# Patient Record
Sex: Female | Born: 1960 | Race: White | Hispanic: No | Marital: Married | Smoking: Never smoker
Health system: Southern US, Community
[De-identification: ages and names within clinical notes are randomized; demographics above are authoritative.]

## PROBLEM LIST (undated history)

## (undated) DIAGNOSIS — E039 Hypothyroidism, unspecified: Secondary | ICD-10-CM

## (undated) DIAGNOSIS — I509 Heart failure, unspecified: Secondary | ICD-10-CM

## (undated) DIAGNOSIS — E785 Hyperlipidemia, unspecified: Secondary | ICD-10-CM

## (undated) DIAGNOSIS — I493 Ventricular premature depolarization: Secondary | ICD-10-CM

## (undated) HISTORY — PX: ABDOMINAL HYSTERECTOMY: SHX81

## (undated) HISTORY — DX: Ventricular premature depolarization: I49.3

---

## 1999-08-16 ENCOUNTER — Other Ambulatory Visit: Admission: RE | Admit: 1999-08-16 | Discharge: 1999-08-16 | Payer: Self-pay | Admitting: Otolaryngology

## 2001-09-19 ENCOUNTER — Other Ambulatory Visit: Admission: RE | Admit: 2001-09-19 | Discharge: 2001-09-19 | Payer: Self-pay | Admitting: Family Medicine

## 2003-07-31 ENCOUNTER — Ambulatory Visit (HOSPITAL_BASED_OUTPATIENT_CLINIC_OR_DEPARTMENT_OTHER): Admission: RE | Admit: 2003-07-31 | Discharge: 2003-07-31 | Payer: Self-pay | Admitting: Otolaryngology

## 2003-07-31 ENCOUNTER — Ambulatory Visit (HOSPITAL_COMMUNITY): Admission: RE | Admit: 2003-07-31 | Discharge: 2003-07-31 | Payer: Self-pay | Admitting: Otolaryngology

## 2018-01-10 DIAGNOSIS — Z1231 Encounter for screening mammogram for malignant neoplasm of breast: Secondary | ICD-10-CM | POA: Diagnosis not present

## 2018-01-30 DIAGNOSIS — Z0000 Encounter for general adult medical examination without abnormal findings: Secondary | ICD-10-CM | POA: Diagnosis not present

## 2018-01-30 DIAGNOSIS — Z13220 Encounter for screening for lipoid disorders: Secondary | ICD-10-CM | POA: Diagnosis not present

## 2018-05-03 DIAGNOSIS — R3915 Urgency of urination: Secondary | ICD-10-CM | POA: Diagnosis not present

## 2018-05-10 DIAGNOSIS — K640 First degree hemorrhoids: Secondary | ICD-10-CM | POA: Diagnosis not present

## 2018-05-10 DIAGNOSIS — Z1211 Encounter for screening for malignant neoplasm of colon: Secondary | ICD-10-CM | POA: Diagnosis not present

## 2018-05-20 DIAGNOSIS — R030 Elevated blood-pressure reading, without diagnosis of hypertension: Secondary | ICD-10-CM | POA: Diagnosis not present

## 2018-05-20 DIAGNOSIS — H6980 Other specified disorders of Eustachian tube, unspecified ear: Secondary | ICD-10-CM | POA: Diagnosis not present

## 2018-05-24 DIAGNOSIS — H9202 Otalgia, left ear: Secondary | ICD-10-CM | POA: Diagnosis not present

## 2018-05-24 DIAGNOSIS — H6592 Unspecified nonsuppurative otitis media, left ear: Secondary | ICD-10-CM | POA: Diagnosis not present

## 2018-05-24 DIAGNOSIS — R42 Dizziness and giddiness: Secondary | ICD-10-CM | POA: Diagnosis not present

## 2020-09-04 ENCOUNTER — Encounter: Payer: Self-pay | Admitting: General Practice

## 2020-09-11 ENCOUNTER — Other Ambulatory Visit: Payer: Self-pay

## 2020-09-11 ENCOUNTER — Encounter: Payer: Self-pay | Admitting: Cardiology

## 2020-09-11 ENCOUNTER — Ambulatory Visit (INDEPENDENT_AMBULATORY_CARE_PROVIDER_SITE_OTHER): Payer: 59 | Admitting: Cardiology

## 2020-09-11 ENCOUNTER — Telehealth: Payer: Self-pay | Admitting: Radiology

## 2020-09-11 VITALS — BP 130/88 | HR 87 | Ht 64.0 in | Wt 136.0 lb

## 2020-09-11 DIAGNOSIS — R0602 Shortness of breath: Secondary | ICD-10-CM | POA: Diagnosis not present

## 2020-09-11 DIAGNOSIS — I493 Ventricular premature depolarization: Secondary | ICD-10-CM | POA: Diagnosis not present

## 2020-09-11 DIAGNOSIS — E785 Hyperlipidemia, unspecified: Secondary | ICD-10-CM | POA: Diagnosis not present

## 2020-09-11 DIAGNOSIS — R079 Chest pain, unspecified: Secondary | ICD-10-CM

## 2020-09-11 NOTE — Patient Instructions (Signed)
Medication Instructions:  Your physician recommends that you continue on your current medications as directed. Please refer to the Current Medication list given to you today.  Testing/Procedures: Your physician has requested that you have an echocardiogram. Echocardiography is a painless test that uses sound waves to create images of your heart. It provides your doctor with information about the size and shape of your heart and how well your heart's chambers and valves are working. This procedure takes approximately one hour. There are no restrictions for this procedure. This will be done at our [facility] location:  [address]  Your physician has requested that you have a lexiscan myoview. For further information please visit [email or web address] Please follow instruction sheet, as given. This will take place at [address] [facility], [address]  How to prepare for your Myocardial Perfusion Test:  Do not eat or drink 3 hours prior to your test, except you may have water.  Do not consume products containing caffeine (regular or decaffeinated) 12 hours prior to your test. (ex: coffee, chocolate, sodas, tea).  Do bring a list of your current medications with you.  If not listed below, you may take your medications as normal.  Do wear comfortable clothes (no dresses or overalls) and walking shoes, tennis shoes preferred (No heels or open toe shoes are allowed).  Do NOT wear cologne, perfume, aftershave, or lotions (deodorant is allowed).  The test will take approximately 3 to 4 hours to complete  If these instructions are not followed, your test will have to be rescheduled.   CT coronary calcium score. This test is done at [address]. This is $150 out of pocket.   Coronary CalciumScan A coronary calcium scan is an imaging test used to look for deposits of calcium and other fatty materials (plaques) in the inner lining of the blood vessels of the  heart (coronary arteries). These deposits of calcium and plaques can partly clog and narrow the coronary arteries without producing any symptoms or warning signs. This puts a person at risk for a heart attack. This test can detect these deposits before symptoms develop. Tell a health care provider about:  Any allergies you have.  All medicines you are taking, including vitamins, herbs, eye drops, creams, and over-the-counter medicines.  Any problems you or family members have had with anesthetic medicines.  Any blood disorders you have.  Any surgeries you have had.  Any medical conditions you have.  Whether you are pregnant or may be pregnant. What are the risks? Generally, this is a safe procedure. However, problems may occur, including:  Harm to a pregnant woman and her unborn baby. This test involves the use of radiation. Radiation exposure can be dangerous to a pregnant woman and her unborn baby. If you are pregnant, you generally should not have this procedure done.  Slight increase in the risk of cancer. This is because of the radiation involved in the test. What happens before the procedure? No preparation is needed for this procedure. What happens during the procedure?  You will undress and remove any jewelry around your neck or chest.  You will put on a hospital gown.  Sticky electrodes will be placed on your chest. The electrodes will be connected to an electrocardiogram (ECG) machine to record a tracing of the electrical activity of your heart.  A CT scanner will take pictures of your heart. During this time, you will be asked to lie still and hold  your breath for 2-3 seconds while a picture of your heart is being taken. The procedure may vary among health care providers and hospitals. What happens after the procedure?  You can get dressed.  You can return to your normal activities.  It is up to you to get the results of your test. Ask your health care provider, or  the department that is doing the test, when your results will be ready. Summary  A coronary calcium scan is an imaging test used to look for deposits of calcium and other fatty materials (plaques) in the inner lining of the blood vessels of the heart (coronary arteries).  Generally, this is a safe procedure. Tell your health care provider if you are pregnant or may be pregnant.  No preparation is needed for this procedure.  A CT scanner will take pictures of your heart.  You can return to your normal activities after the scan is done. This information is not intended to replace advice given to you by your health care provider. Make sure you discuss any questions you have with your health care provider. Document Released: 03/17/2008 Document Revised: 08/08/2016 Document Reviewed: 08/08/2016 Elsevier Interactive Patient Education  2017 Elsevier Inc.   Christena Deem- Long Term Monitor Instructions   Your physician has requested you wear your ZIO patch monitor 3 days.   This is a single patch monitor.  [employer] supplies one patch monitor per enrollment.  Additional stickers are not available.   Please do not apply patch if you will be having a Nuclear Stress Test, Echocardiogram, Cardiac CT, MRI, or Chest Xray during the time frame you would be wearing the monitor. The patch cannot be worn during these tests.  You cannot remove and re-apply the ZIO XT patch monitor.   Your ZIO patch monitor will be sent USPS Priority mail from [facility] directly to your home address. The monitor may also be mailed to a PO BOX if home delivery is not available.   It may take 3-5 days to receive your monitor after you have been enrolled.   Once you have received you monitor, please review enclosed instructions.  Your monitor has already been registered assigning a specific monitor serial # to you.   Applying the monitor   Shave hair from upper left chest.   Hold abrader disc by orange tab.  Rub abrader  in 40 strokes over left upper chest as indicated in your monitor instructions.   Clean area with 4 enclosed alcohol pads .  Use all pads to assure are is cleaned thoroughly.  Let dry.   Apply patch as indicated in monitor instructions.  Patch will be place under collarbone on left side of chest with arrow pointing upward.   Rub patch adhesive wings for 2 minutes.Remove white label marked "1".  Remove white label marked "2".  Rub patch adhesive wings for 2 additional minutes.   While looking in a mirror, press and release button in center of patch.  A small green light will flash 3-4 times .  This will be your only indicator the monitor has been turned on.     Do not shower for the first 24 hours.  You may shower after the first 24 hours.   Press button if you feel a symptom. You will hear a small click.  Record Date, Time and Symptom in the Patient Log Book.   When you are ready to remove patch, follow instructions on last 2 pages of Patient Log Book.  Stick patch monitor onto last page of Patient Log Book.   Place Patient Log Book in [place] box.  Use locking tab on box and tape box closed securely.  The [employer] and [employer] has [employer] on it.  Please place in mailbox as soon as possible.  Your physician should have your test results approximately 7 days after the monitor has been mailed back to [facility].   Call [facility] Customer Care at [phone] if you have questions regarding your ZIO XT patch monitor.  Call them immediately if you see an orange light blinking on your monitor.   If your monitor falls off in less than 4 days contact our Monitor department at [phone].  If your monitor becomes loose or falls off after 4 days call [employer] at [phone] for suggestions on securing your monitor.   Follow-Up: At [facility], you and your health needs are our priority.  As part of our continuing mission to provide you with exceptional heart care, we have created  designated Provider Care Teams.  These Care Teams include your primary Cardiologist (physician) and Advanced Practice Providers (APPs -  Physician Assistants and Nurse Practitioners) who all work together to provide you with the care you need, when you need it.  We recommend signing up for the patient portal called "MyChart".  Sign up information is provided on this After Visit Summary.  MyChart is used to connect with patients for Virtual Visits (Telemedicine).  Patients are able to view lab/test results, encounter notes, upcoming appointments, etc.  Non-urgent messages can be sent to your provider as well.   To learn more about what you can do with MyChart, go to [email or web address].    Your next appointment:   3 month(s)  The format for your next appointment:   In Person  Provider:   Epifanio Lesches, MD

## 2020-09-11 NOTE — Progress Notes (Signed)
Cardiology Office Note:    Date:  09/11/2020   ID:  Janet Ramirez, DOB [birth year], MRN [id number]  PCP:  Mitzi Hansen, NP  Cardiologist:  No primary care provider on file.  Electrophysiologist:  None   Referring MD: Mitzi Hansen, NP   Chief Complaint  Patient presents with  . Shortness of Breath         History of Present Illness:    Janet Ramirez is a 59 y.o. female with a hx of hypothyroidism, hyperlipidemia who is referred by Mitzi Hansen, NP for evaluation of EKG abnormalities.  She reports that she has been having shortness of breath and chest pain.  States that she notes shortness of breath with exertion, particular when walking up hills.  In addition has been having chest pain, that often occurs at night.  Describes as left-sided dull aching pain/pressure.  Radiates to back.  5 out of 10 in intensity.  Last for about 30 minutes.  States that antacid used to help when she was having the pain but have not been helping recently.  Also reports occasional lightheadedness, denies any syncope.  Denies any lower extremity edema.  Reports has been having palpitations for years.  Occurs about once per week, last for few seconds and resolves.  No smoking history.  Family history includes father had PCI in 73s.  Labs on 08/24/2020 showed LDL 121, creatinine 0.8, sodium 143, potassium 3.7, normal LFTs, TSH 3.85, free T4 0.76.   Current Medications: Current Meds  Medication Sig  . Ascorbic Acid (VITAMIN C) 1000 MG tablet Take 1,000 mg by mouth daily. 1 Tablet Daily  . cholecalciferol (VITAMIN D3) 25 MCG (1000 UNIT) tablet Take 1,000 Units by mouth daily. 1 Tablet Daily  . zinc gluconate 50 MG tablet Take 50 mg by mouth daily. 1 Tablet Daily     Allergies:   Codeine and Sulfacetamide sodium   Social History   Socioeconomic History  . Marital status: Married    Spouse name: Not on file  . Number of children: Not on file  . Years of education: Not on file  . Highest education level: Not  on file  Occupational History  . Not on file  Tobacco Use  . Smoking status: Never Smoker  . Smokeless tobacco: Never Used  Substance and Sexual Activity  . Alcohol use: Not Currently  . Drug use: Never  . Sexual activity: Yes    Partners: Male  Other Topics Concern  . Not on file  Social History Narrative  . Not on file   Social Determinants of Health   Financial Resource Strain: Not on file  Food Insecurity: Not on file  Transportation Needs: Not on file  Physical Activity: Not on file  Stress: Not on file  Social Connections: Not on file     Family History: Family history includes father had PCI in 48s.  ROS:   Please see the history of present illness.    All other systems reviewed and are negative.  EKGs/Labs/Other Studies Reviewed:    The following studies were reviewed today:   EKG:  EKG is ordered today.  The ekg ordered today demonstrates sinus rhythm, rate 87, PVCs, LVH with repolarization abnormalities  Recent Labs: No results found for requested labs within last 8760 hours.  Recent Lipid Panel No results found for: CHOL, TRIG, HDL, CHOLHDL, VLDL, LDLCALC, LDLDIRECT  Physical Exam:    VS:  BP 130/88   Pulse 87   Ht  (1.626 m)  Wt 136 lb (61.7 kg)   SpO2 97%   BMI 23.34 kg/m     Wt Readings from Last 3 Encounters:  09/11/20 136 lb (61.7 kg)     GEN: Well nourished, well developed in no acute distress HEENT: Normal NECK: No JVD; No carotid bruits LYMPHATICS: No lymphadenopathy CARDIAC: RRR, no murmurs, rubs, gallops RESPIRATORY:  Clear to auscultation without rales, wheezing or rhonchi  ABDOMEN: Soft, non-tender, non-distended MUSCULOSKELETAL:  No edema; No deformity  SKIN: Warm and dry NEUROLOGIC:  Alert and oriented x 3 PSYCHIATRIC:  Normal affect   ASSESSMENT:    1. Chest pain of uncertain etiology   2. Shortness of breath   3. PVC's (premature ventricular contractions)   4. Hyperlipidemia, unspecified hyperlipidemia type     PLAN:    Chest pain/dyspnea on exertion: Chest pain is atypical in description.  However also having dyspnea on exertion that could represent anginal equivalent.  She is not a good candidate for coronary CTA given frequent PVCs on EKG.  Will evaluate for ischemia with Lexiscan Myoview.  Will check echocardiogram to evaluate for structural heart disease.  Frequent PVCs: Noted on EKG.  Will check Zio patch x3 days to quantify PVC burden.  Check echocardiogram as above  Hyperlipidemia: LDL 129 08/24/2020.  Does not meet indication for statin given low risk ASCVD risk score.  Will check calcium score for further stratification.  RTC in 3 months   Medication Adjustments/Labs and Tests Ordered: Current medicines are reviewed at length with the patient today.  Concerns regarding medicines are outlined above.  Orders Placed This Encounter  Procedures  . CT CARDIAC SCORING  . MYOCARDIAL PERFUSION IMAGING  . LONG TERM MONITOR (3-14 DAYS)  . EKG 12-Lead  . ECHOCARDIOGRAM COMPLETE   No orders of the defined types were placed in this encounter.   Patient Instructions  Medication Instructions:  Your physician recommends that you continue on your current medications as directed. Please refer to the Current Medication list given to you today.  Testing/Procedures: Your physician has requested that you have an echocardiogram. Echocardiography is a painless test that uses sound waves to create images of your heart. It provides your doctor with information about the size and shape of your heart and how well your heart's chambers and valves are working. This procedure takes approximately one hour. There are no restrictions for this procedure. This will be done at our [facility] location:  [address]  Your physician has requested that you have a lexiscan myoview. For further information please visit [email or web address] Please follow instruction sheet, as given. This will take  place at [address] [facility], [address]  How to prepare for your Myocardial Perfusion Test:  Do not eat or drink 3 hours prior to your test, except you may have water.  Do not consume products containing caffeine (regular or decaffeinated) 12 hours prior to your test. (ex: coffee, chocolate, sodas, tea).  Do bring a list of your current medications with you.  If not listed below, you may take your medications as normal.  Do wear comfortable clothes (no dresses or overalls) and walking shoes, tennis shoes preferred (No heels or open toe shoes are allowed).  Do NOT wear cologne, perfume, aftershave, or lotions (deodorant is allowed).  The test will take approximately 3 to 4 hours to complete  If these instructions are not followed, your test will have to be rescheduled.   CT coronary calcium score. This test is done at  [address]. This is $150 out of pocket.   Coronary CalciumScan A coronary calcium scan is an imaging test used to look for deposits of calcium and other fatty materials (plaques) in the inner lining of the blood vessels of the heart (coronary arteries). These deposits of calcium and plaques can partly clog and narrow the coronary arteries without producing any symptoms or warning signs. This puts a person at risk for a heart attack. This test can detect these deposits before symptoms develop. Tell a health care provider about:  Any allergies you have.  All medicines you are taking, including vitamins, herbs, eye drops, creams, and over-the-counter medicines.  Any problems you or family members have had with anesthetic medicines.  Any blood disorders you have.  Any surgeries you have had.  Any medical conditions you have.  Whether you are pregnant or may be pregnant. What are the risks? Generally, this is a safe procedure. However, problems may occur, including:  Harm to a pregnant woman and her unborn baby. This test involves the use of  radiation. Radiation exposure can be dangerous to a pregnant woman and her unborn baby. If you are pregnant, you generally should not have this procedure done.  Slight increase in the risk of cancer. This is because of the radiation involved in the test. What happens before the procedure? No preparation is needed for this procedure. What happens during the procedure?  You will undress and remove any jewelry around your neck or chest.  You will put on a hospital gown.  Sticky electrodes will be placed on your chest. The electrodes will be connected to an electrocardiogram (ECG) machine to record a tracing of the electrical activity of your heart.  A CT scanner will take pictures of your heart. During this time, you will be asked to lie still and hold your breath for 2-3 seconds while a picture of your heart is being taken. The procedure may vary among health care providers and hospitals. What happens after the procedure?  You can get dressed.  You can return to your normal activities.  It is up to you to get the results of your test. Ask your health care provider, or the department that is doing the test, when your results will be ready. Summary  A coronary calcium scan is an imaging test used to look for deposits of calcium and other fatty materials (plaques) in the inner lining of the blood vessels of the heart (coronary arteries).  Generally, this is a safe procedure. Tell your health care provider if you are pregnant or may be pregnant.  No preparation is needed for this procedure.  A CT scanner will take pictures of your heart.  You can return to your normal activities after the scan is done. This information is not intended to replace advice given to you by your health care provider. Make sure you discuss any questions you have with your health care provider. Document Released: 03/17/2008 Document Revised: 08/08/2016 Document Reviewed: 08/08/2016 Elsevier Interactive Patient  Education  2017 Elsevier Inc.   Christena Deem- Long Term Monitor Instructions   Your physician has requested you wear your ZIO patch monitor 3 days.   This is a single patch monitor.  [employer] supplies one patch monitor per enrollment.  Additional stickers are not available.   Please do not apply patch if you will be having a Nuclear Stress Test, Echocardiogram, Cardiac CT, MRI, or Chest Xray during the time frame you would  be wearing the monitor. The patch cannot be worn during these tests.  You cannot remove and re-apply the ZIO XT patch monitor.   Your ZIO patch monitor will be sent USPS Priority mail from [facility] directly to your home address. The monitor may also be mailed to a PO BOX if home delivery is not available.   It may take 3-5 days to receive your monitor after you have been enrolled.   Once you have received you monitor, please review enclosed instructions.  Your monitor has already been registered assigning a specific monitor serial # to you.   Applying the monitor   Shave hair from upper left chest.   Hold abrader disc by orange tab.  Rub abrader in 40 strokes over left upper chest as indicated in your monitor instructions.   Clean area with 4 enclosed alcohol pads .  Use all pads to assure are is cleaned thoroughly.  Let dry.   Apply patch as indicated in monitor instructions.  Patch will be place under collarbone on left side of chest with arrow pointing upward.   Rub patch adhesive wings for 2 minutes.Remove white label marked "1".  Remove white label marked "2".  Rub patch adhesive wings for 2 additional minutes.   While looking in a mirror, press and release button in center of patch.  A small green light will flash 3-4 times .  This will be your only indicator the monitor has been turned on.     Do not shower for the first 24 hours.  You may shower after the first 24 hours.   Press button if you feel a symptom. You will hear a small click.  Record Date,  Time and Symptom in the Patient Log Book.   When you are ready to remove patch, follow instructions on last 2 pages of Patient Log Book.  Stick patch monitor onto last page of Patient Log Book.   Place Patient Log Book in [place] box.  Use locking tab on box and tape box closed securely.  The [employer] has [employer] on it.  Please place in mailbox as soon as possible.  Your physician should have your test results approximately 7 days after the monitor has been mailed back to [facility].   Call [facility] Customer Care at [phone] if you have questions regarding your ZIO XT patch monitor.  Call them immediately if you see an orange light blinking on your monitor.   If your monitor falls off in less than 4 days contact our Monitor department at [phone].  If your monitor becomes loose or falls off after 4 days call [employer] at [phone] for suggestions on securing your monitor.   Follow-Up: At [facility], you and your health needs are our priority.  As part of our continuing mission to provide you with exceptional heart care, we have created designated Provider Care Teams.  These Care Teams include your primary Cardiologist (physician) and Advanced Practice Providers (APPs -  Physician Assistants and Nurse Practitioners) who all work together to provide you with the care you need, when you need it.  We recommend signing up for the patient portal called "MyChart".  Sign up information is provided on this After Visit Summary.  MyChart is used to connect with patients for Virtual Visits (Telemedicine).  Patients are able to view lab/test results, encounter notes, upcoming appointments, etc.  Non-urgent messages can be sent to your provider as well.   To learn more about  what you can do with MyChart, go to [email or web address].    Your next appointment:   3 month(s)  The format for your next appointment:   In Person  Provider:   Epifanio Lesches,  MD       Signed, Little Ishikawa, MD  09/11/2020 8:53 PM    [facility]

## 2020-09-11 NOTE — Telephone Encounter (Signed)
Enrolled patient for a 3 day Zio XT monitor to be mailed to patients home  

## 2020-09-14 ENCOUNTER — Inpatient Hospital Stay (INDEPENDENT_AMBULATORY_CARE_PROVIDER_SITE_OTHER): Payer: 59

## 2020-09-14 DIAGNOSIS — I493 Ventricular premature depolarization: Secondary | ICD-10-CM

## 2020-09-26 ENCOUNTER — Inpatient Hospital Stay (HOSPITAL_COMMUNITY)
Admission: EM | Admit: 2020-09-26 | Discharge: 2020-09-29 | DRG: 280 | Disposition: A | Discharge status: Home / Self Care | Payer: 59 | Attending: Cardiology | Admitting: Cardiology

## 2020-09-26 ENCOUNTER — Other Ambulatory Visit: Payer: Self-pay

## 2020-09-26 ENCOUNTER — Other Ambulatory Visit (HOSPITAL_COMMUNITY): Payer: 59

## 2020-09-26 ENCOUNTER — Emergency Department (HOSPITAL_COMMUNITY): Payer: 59

## 2020-09-26 ENCOUNTER — Inpatient Hospital Stay (HOSPITAL_COMMUNITY): Payer: 59

## 2020-09-26 ENCOUNTER — Encounter (HOSPITAL_COMMUNITY): Payer: Self-pay | Admitting: Emergency Medicine

## 2020-09-26 DIAGNOSIS — E039 Hypothyroidism, unspecified: Secondary | ICD-10-CM | POA: Diagnosis present

## 2020-09-26 DIAGNOSIS — I214 Non-ST elevation (NSTEMI) myocardial infarction: Principal | ICD-10-CM

## 2020-09-26 DIAGNOSIS — I340 Nonrheumatic mitral (valve) insufficiency: Secondary | ICD-10-CM | POA: Diagnosis not present

## 2020-09-26 DIAGNOSIS — I519 Heart disease, unspecified: Secondary | ICD-10-CM | POA: Diagnosis not present

## 2020-09-26 DIAGNOSIS — I5021 Acute systolic (congestive) heart failure: Secondary | ICD-10-CM | POA: Diagnosis present

## 2020-09-26 DIAGNOSIS — Z882 Allergy status to sulfonamides status: Secondary | ICD-10-CM

## 2020-09-26 DIAGNOSIS — R778 Other specified abnormalities of plasma proteins: Secondary | ICD-10-CM

## 2020-09-26 DIAGNOSIS — Z90710 Acquired absence of both cervix and uterus: Secondary | ICD-10-CM

## 2020-09-26 DIAGNOSIS — I361 Nonrheumatic tricuspid (valve) insufficiency: Secondary | ICD-10-CM | POA: Diagnosis not present

## 2020-09-26 DIAGNOSIS — Z8249 Family history of ischemic heart disease and other diseases of the circulatory system: Secondary | ICD-10-CM | POA: Diagnosis not present

## 2020-09-26 DIAGNOSIS — I428 Other cardiomyopathies: Secondary | ICD-10-CM | POA: Diagnosis present

## 2020-09-26 DIAGNOSIS — R079 Chest pain, unspecified: Secondary | ICD-10-CM | POA: Diagnosis present

## 2020-09-26 DIAGNOSIS — Z20822 Contact with and (suspected) exposure to covid-19: Secondary | ICD-10-CM | POA: Diagnosis present

## 2020-09-26 DIAGNOSIS — I493 Ventricular premature depolarization: Secondary | ICD-10-CM

## 2020-09-26 DIAGNOSIS — R002 Palpitations: Secondary | ICD-10-CM | POA: Diagnosis present

## 2020-09-26 DIAGNOSIS — Z79899 Other long term (current) drug therapy: Secondary | ICD-10-CM

## 2020-09-26 DIAGNOSIS — R0789 Other chest pain: Secondary | ICD-10-CM | POA: Diagnosis present

## 2020-09-26 DIAGNOSIS — I351 Nonrheumatic aortic (valve) insufficiency: Secondary | ICD-10-CM

## 2020-09-26 DIAGNOSIS — E785 Hyperlipidemia, unspecified: Secondary | ICD-10-CM | POA: Diagnosis present

## 2020-09-26 DIAGNOSIS — Z885 Allergy status to narcotic agent status: Secondary | ICD-10-CM | POA: Diagnosis not present

## 2020-09-26 HISTORY — DX: Hypothyroidism, unspecified: E03.9

## 2020-09-26 HISTORY — DX: Hyperlipidemia, unspecified: E78.5

## 2020-09-26 LAB — CBC
HCT: 39.7 % (ref 36.0–46.0)
Hemoglobin: 13.3 g/dL (ref 12.0–15.0)
MCH: 32.1 pg (ref 26.0–34.0)
MCHC: 33.5 g/dL (ref 30.0–36.0)
MCV: 95.9 fL (ref 80.0–100.0)
Platelets: 211 10*3/uL (ref 150–400)
RBC: 4.14 MIL/uL (ref 3.87–5.11)
RDW: 12.1 % (ref 11.5–15.5)
WBC: 4.6 10*3/uL (ref 4.0–10.5)
nRBC: 0 % (ref 0.0–0.2)

## 2020-09-26 LAB — COMPREHENSIVE METABOLIC PANEL
ALT: 19 U/L (ref 0–44)
AST: 26 U/L (ref 15–41)
Albumin: 4.5 g/dL (ref 3.5–5.0)
Alkaline Phosphatase: 68 U/L (ref 38–126)
Anion gap: 12 (ref 5–15)
BUN: 13 mg/dL (ref 6–20)
CO2: 27 mmol/L (ref 22–32)
Calcium: 9.7 mg/dL (ref 8.9–10.3)
Chloride: 103 mmol/L (ref 98–111)
Creatinine, Ser: 0.97 mg/dL (ref 0.44–1.00)
GFR, Estimated: >60 mL/min (ref >60)
Glucose, Bld: 115 mg/dL — ABNORMAL HIGH (ref 70–99)
Potassium: 3.5 mmol/L (ref 3.5–5.1)
Sodium: 142 mmol/L (ref 135–145)
Total Bilirubin: 0.4 mg/dL (ref 0.3–1.2)
Total Protein: 6.9 g/dL (ref 6.5–8.1)

## 2020-09-26 LAB — HIV ANTIBODY (ROUTINE TESTING W REFLEX): HIV Screen 4th Generation wRfx: NONREACTIVE

## 2020-09-26 LAB — BRAIN NATRIURETIC PEPTIDE: B Natriuretic Peptide: 608.1 pg/mL — ABNORMAL HIGH (ref 0.0–100.0)

## 2020-09-26 LAB — RESP PANEL BY RT-PCR (FLU A&B, COVID) ARPGX2
Influenza A by PCR: NEGATIVE
Influenza B by PCR: NEGATIVE
SARS Coronavirus 2 by RT PCR: NEGATIVE

## 2020-09-26 LAB — TSH: TSH: 2.261 u[IU]/mL (ref 0.350–4.500)

## 2020-09-26 LAB — ECHOCARDIOGRAM COMPLETE
Area-P 1/2: 4.6 cm2
Height: 64 in
S' Lateral: 5.4 cm
Single Plane A4C EF: 23.6 %
Weight: 2067.2 oz

## 2020-09-26 LAB — D-DIMER, QUANTITATIVE: D-Dimer, Quant: <0.27 ug/mL-FEU (ref 0.00–0.50)

## 2020-09-26 LAB — TROPONIN I (HIGH SENSITIVITY)
Troponin I (High Sensitivity): 57 ng/L — ABNORMAL HIGH (ref <18)
Troponin I (High Sensitivity): 180 ng/L (ref <18)
Troponin I (High Sensitivity): 187 ng/L (ref <18)
Troponin I (High Sensitivity): 158 ng/L (ref <18)

## 2020-09-26 LAB — HEPARIN LEVEL (UNFRACTIONATED): Heparin Unfractionated: 0.32 IU/mL (ref 0.30–0.70)

## 2020-09-26 LAB — MAGNESIUM: Magnesium: 2.2 mg/dL (ref 1.7–2.4)

## 2020-09-26 MED ORDER — HEPARIN (PORCINE) 25000 UT/250ML-% IV SOLN
850.0000 [IU]/h | INTRAVENOUS | Status: DC
  Administered 2020-09-26: 700 [IU]/h via INTRAVENOUS
  Administered 2020-09-27: 750 [IU]/h via INTRAVENOUS
  Filled 2020-09-26 (×2): qty 250

## 2020-09-26 MED ORDER — ONDANSETRON HCL 4 MG/2ML IJ SOLN: 4.0000 mg | Freq: Four times a day (QID) | INTRAMUSCULAR | Status: DC | PRN

## 2020-09-26 MED ORDER — METOPROLOL TARTRATE 25 MG PO TABS
25.0000 mg | ORAL_TABLET | Freq: Once | ORAL | Status: AC
  Administered 2020-09-26: 25 mg via ORAL
  Filled 2020-09-26: qty 1

## 2020-09-26 MED ORDER — HEPARIN BOLUS VIA INFUSION
3000.0000 [IU] | Freq: Once | INTRAVENOUS | Status: AC
  Administered 2020-09-26: 3000 [IU] via INTRAVENOUS
  Filled 2020-09-26: qty 3000

## 2020-09-26 MED ORDER — NITROGLYCERIN 0.4 MG SL SUBL
0.4000 mg | SUBLINGUAL_TABLET | SUBLINGUAL | Status: DC | PRN
  Filled 2020-09-26: qty 1

## 2020-09-26 MED ORDER — ACETAMINOPHEN 325 MG PO TABS
650.0000 mg | ORAL_TABLET | ORAL | Status: DC | PRN
  Administered 2020-09-26 – 2020-09-28 (×2): 650 mg via ORAL
  Filled 2020-09-26 (×2): qty 2

## 2020-09-26 MED ORDER — SODIUM CHLORIDE 0.9% FLUSH
3.0000 mL | Freq: Two times a day (BID) | INTRAVENOUS | Status: DC
  Administered 2020-09-26 – 2020-09-28 (×4): 3 mL via INTRAVENOUS

## 2020-09-26 MED ORDER — ASPIRIN 81 MG PO CHEW
324.0000 mg | CHEWABLE_TABLET | Freq: Once | ORAL | Status: AC
  Administered 2020-09-26: 324 mg via ORAL
  Filled 2020-09-26: qty 4

## 2020-09-26 MED ORDER — ACETAMINOPHEN 325 MG PO TABS
650.0000 mg | ORAL_TABLET | Freq: Once | ORAL | Status: AC
  Administered 2020-09-26: 650 mg via ORAL
  Filled 2020-09-26: qty 2

## 2020-09-26 MED ORDER — METOPROLOL TARTRATE 25 MG PO TABS
25.0000 mg | ORAL_TABLET | Freq: Two times a day (BID) | ORAL | Status: DC
  Administered 2020-09-26: 25 mg via ORAL
  Filled 2020-09-26: qty 1

## 2020-09-26 MED ORDER — NITROGLYCERIN 2 % TD OINT
0.5000 [in_us] | TOPICAL_OINTMENT | Freq: Once | TRANSDERMAL | Status: AC
  Administered 2020-09-26: 0.5 [in_us] via TOPICAL

## 2020-09-26 MED ORDER — ASPIRIN EC 81 MG PO TBEC
81.0000 mg | DELAYED_RELEASE_TABLET | Freq: Every day | ORAL | Status: DC
  Administered 2020-09-27 – 2020-09-29 (×2): 81 mg via ORAL
  Filled 2020-09-26 (×2): qty 1

## 2020-09-26 MED ORDER — ATORVASTATIN CALCIUM 80 MG PO TABS
80.0000 mg | ORAL_TABLET | Freq: Every day | ORAL | Status: DC
  Administered 2020-09-27 – 2020-09-28 (×2): 80 mg via ORAL
  Filled 2020-09-26 (×2): qty 1

## 2020-09-26 NOTE — ED Notes (Signed)
Pt ambulated down the hall to the restroom without difficulty.

## 2020-09-26 NOTE — ED Provider Notes (Signed)
Patient signed out to me at 7 AM awaiting repeat troponin and likely cardiology consultation.  Patient with left-sided chest pain that she describes as pressure and burning lasted a few minutes.  Has been coming and going.  May be down her left arm.  She states that burping makes it feel better.  Nothing makes it feel worse.  She has been evaluated recently by cardiology for palpitations and overall has felt that her heart was beating fast today.  D-dimer is normal and doubt PE.  She is scheduled for an outpatient stress test and echocardiogram and Holter monitor as well.  Patient with no history of hypertension or diabetes.  Does have high cholesterol but is not on medications.  She denies smoking history.  No significant medical history for heart disease.  Overall not many cardiac risk factors but troponin is 57.  EKG overall without ischemic changes.  Lab work is otherwise unremarkable.  No significant anemia, electrolyte abnormality, kidney injury.  Will repeat troponin and touch base with cardiology.  Currently on my evaluation she is chest pain-free.  Repeat troponin is 180.  Covid test is negative.  Patient started on IV heparin bolus and infusion for non-ST elevation MI.  Patient given aspirin as well.  Talked with Dr. Bufford Buttner with cardiology who will admit the patient over to [facility].  Hemodynamically stable at this time.  Pain well controlled at this time.  This chart was dictated using voice recognition software.  Despite best efforts to proofread,  errors can occur which can change the documentation meaning.    .Critical Care Performed by: Virgina Norfolk, DO Authorized by: Virgina Norfolk, DO   Critical care provider statement:    Critical care time (minutes):  45   Critical care was necessary to treat or prevent imminent or life-threatening deterioration of the following conditions:  Cardiac failure   Critical care was time spent personally by me on the following activities:  Discussions  with consultants, evaluation of patient's response to treatment, examination of patient, ordering and performing treatments and interventions, ordering and review of laboratory studies, ordering and review of radiographic studies, pulse oximetry, re-evaluation of patient's condition, obtaining history from patient or surrogate and review of old charts      Virgina Norfolk, DO 09/26/20 1003

## 2020-09-26 NOTE — H&P (Signed)
Cardiology Admission History and Physical:   Patient ID: Janet Ramirez MRN: [id number]; DOB: 1961/01/21   Admission date: 09/26/2020  Primary Care Provider: Mitzi Hansen, NP [facility] Cardiologist: Little Ishikawa, MD  [facility] Electrophysiologist:  None   Chief Complaint:  Chest pain, tachypalpitation  Patient Profile:   Janet Ramirez is a 59 y.o. female with HLD and hypothyroidism presented with chest pain and tachypalpitation and found to have elevated troponin  History of Present Illness:   Janet Ramirez is a pleasant 59 year old female with past medical history of hyperlipidemia and hypothyroidism who presented with chest pain. Patient mentions she was on levothyroxine for short period in the past however is no longer taking levothyroxine. She does not have any prior history. She was recently seen by Dr. Bjorn Pippin on 09/11/2020 for abnormal EKG. On her EKG, she has very frequent PVCs. Talking with the patient, she endorsed exertional dyspnea and occasional chest discomfort that occurs at rest however not with exertion. Dr. Bjorn Pippin ordered a coronary scoring test and also a Myoview. She was deemed a poor candidate for coronary CT due to frequent PVCs. A 3-day Zio monitor was also ordered to quantify PVC burden.  In the past week, she has been having more frequent episode of chest discomfort. The location of pain is located in the left shoulder blade radiating to the front of the chest. She described it as a pressure-like sensation. Interestingly, she has been able to walk her dog frequently without any exertional type of chest discomfort. She woke up around 3 AM this morning with the same chest discomfort, shortly after, she started having a pounding sensation in her chest and feeling of tachycardia palpitation. Symptom lasted a total 15 to 20 minutes. This prompted her to seek medical attention at [facility]. Initial EKG showed sinus rhythm, nonspecific T wave changes,  frequent PVCs. Serial troponin 57-->180-->187. D-dimer negative. Viral panel negative.    Past Medical History:  Diagnosis Date  . Hyperlipidemia   . Hypothyroidism     Past Surgical History:  Procedure Laterality Date  . ABDOMINAL HYSTERECTOMY  1980s     Medications Prior to Admission: Prior to Admission medications   Medication Sig Start Date End Date Taking? Authorizing Provider  alum & mag hydroxide-simeth (MAALOX/MYLANTA) 200-200-20 MG/5ML suspension Take 30 mLs by mouth every 6 (six) hours as needed for indigestion or heartburn.   Yes [provider]  Ascorbic Acid (VITAMIN C) 1000 MG tablet Take 1,000 mg by mouth daily. 1 Tablet Daily   Yes [provider]  aspirin 325 MG tablet Take 325 mg by mouth as needed for mild pain.   Yes [provider]  bismuth subsalicylate (PEPTO BISMOL) 262 MG chewable tablet Chew 524 mg by mouth as needed for indigestion.   Yes [provider]  calcium carbonate (TUMS - DOSED IN MG ELEMENTAL CALCIUM) 500 MG chewable tablet Chew 1-2 tablets by mouth as needed for indigestion or heartburn.   Yes [provider]  cholecalciferol (VITAMIN D3) 25 MCG (1000 UNIT) tablet Take 1,000 Units by mouth daily. 1 Tablet Daily   Yes [provider]  esomeprazole (NEXIUM) 40 MG capsule Take 40 mg by mouth daily as needed (heartburn).   Yes [provider]  simethicone (MYLICON) 125 MG chewable tablet Chew 125 mg by mouth every 6 (six) hours as needed for flatulence.   Yes [provider]  zinc gluconate 50 MG tablet Take 50 mg by mouth daily. 1 Tablet Daily  Yes [provider]     Allergies:    Allergies  Allergen Reactions  . Codeine Itching  . Sulfacetamide Sodium Diarrhea    Social History:   Social History   Socioeconomic History  . Marital status: Married    Spouse name: Not on file  . Number of children: Not on file  . Years of education: Not on file  . Highest  education level: Not on file  Occupational History  . Not on file  Tobacco Use  . Smoking status: Never Smoker  . Smokeless tobacco: Never Used  Substance and Sexual Activity  . Alcohol use: Yes    Comment: socially  . Drug use: Never  . Sexual activity: Yes    Partners: Male  Other Topics Concern  . Not on file  Social History Narrative  . Not on file   Social Determinants of Health   Financial Resource Strain: Not on file  Food Insecurity: Not on file  Transportation Needs: Not on file  Physical Activity: Not on file  Stress: Not on file  Social Connections: Not on file  Intimate Partner Violence: Not on file    Family History:   The patient's family history includes Diabetes Mellitus II in her father; Heart disease in her father; Hypertension in her mother.    ROS:  Please see the history of present illness.  All other ROS reviewed and negative.     Physical Exam/Data:   Vitals:   09/26/20 0900 09/26/20 1100 09/26/20 1135 09/26/20 1215  BP: 120/68 125/65  116/77  Pulse: (!) 58 61  78  Resp: 15 (!) 29  16  Temp:   97.8 F (36.6 C) 98.6 F (37 C)  TempSrc:   Oral Oral  SpO2: 98% 99%  96%  Weight:    58.6 kg  Height:    5\' 4"  (1.626 m)   No intake or output data in the 24 hours ending 09/26/20 1313 Last 3 Weights 09/26/2020 09/26/2020 09/11/2020  Weight (lbs) 129 lb 3.2 oz 131 lb 12.8 oz 136 lb  Weight (kg) 58.605 kg 59.784 kg 61.689 kg     Body mass index is 22.18 kg/m.  General:  Well nourished, well developed, in no acute distress HEENT: normal Lymph: no adenopathy Neck: no JVD Endocrine:  No thryomegaly Vascular: No carotid bruits; FA pulses 2+ bilaterally without bruits  Cardiac:  normal S1, S2; RRR; no murmur  Lungs:  clear to auscultation bilaterally, no wheezing, rhonchi or rales  Abd: soft, nontender, no hepatomegaly  Ext: no edema Musculoskeletal:  No deformities, BUE and BLE strength normal and equal Skin: warm and dry  Neuro:  CNs 2-12  intact, no focal abnormalities noted Psych:  Normal affect    EKG:  The ECG that was done and was personally reviewed and demonstrates sinus rhythm with poor R wave progression in the anterior leads, minimal ST depression in the inferior leads and V6  Relevant CV Studies:  N/A  Laboratory Data:  High Sensitivity Troponin:   Recent Labs  Lab 09/26/20 0550 09/26/20 0832 09/26/20 1011  TROPONINIHS 57* 180* 187*      Chemistry Recent Labs  Lab 09/26/20 0545  NA 142  K 3.5  CL 103  CO2 27  GLUCOSE 115*  BUN 13  CREATININE 0.97  CALCIUM 9.7  GFRNONAA >60  ANIONGAP 12    Recent Labs  Lab 09/26/20 0545  PROT 6.9  ALBUMIN 4.5  AST 26  ALT 19  ALKPHOS 68  BILITOT 0.4   Hematology Recent Labs  Lab 09/26/20 0550  WBC 4.6  RBC 4.14  HGB 13.3  HCT 39.7  MCV 95.9  MCH 32.1  MCHC 33.5  RDW 12.1  PLT 211   BNPNo results for input(s): BNP, PROBNP in the last 168 hours.  DDimer  Recent Labs  Lab 09/26/20 0545  DDIMER <0.27     Radiology/Studies:  DG Chest 2 View  Result Date: 09/26/2020 CLINICAL DATA:  Chest pain EXAM: CHEST - 2 VIEW COMPARISON:  None. FINDINGS: Borderline cardiomegaly. Negative aortic and hilar contours. There is no edema, consolidation, effusion, or pneumothorax. IMPRESSION: No evidence of active disease. Electronically Signed   By: Marnee Spring M.D.   On: 09/26/2020 05:55     Assessment and Plan:   1. Chest and back pain with elevated troponin  -Chest and back pain occurs intermittently, blood pressure is normal, suspicion for dissection fairly low.  D-dimer negative  -Agree with Dr. Francis Gaines initial assessment that the symptom is quite atypical.  Her recent symptom has been occurring at rest however it does not occur when she walks her dog.  Symptoms alleviated by burping.    - I am unable to explain her mildly elevated troponin.  She is not a candidate for coronary CTA due to frequent PVCs.  Will discuss with MD regarding  Myoview versus definitive evaluation via cardiac catheterization.  -Obtain echocardiogram, if LV function is low, would lean more toward cardiac catheterization.  2. Tachypalpitation: Symptoms this morning started with chest discomfort, however quickly had pounding sensation and tachycardia palpitation that lasted 15 to 20 minutes.  This prompted the patient to seek medical attention.  On telemetry, she has frequent PVCs.  We will continue on the telemetry, however if no obvious arrhythmia, may need to consider a 2-week Zio monitor instead of the previously considered 3 day Zio monitor.  3. Frequent PVCs: obtain TSH.  Patient has very frequent PVCs, recently ordered her monitor to check for PVC burden.  4. Hyperlipidemia: Not on any statins at home.  Obtain fasting lipid panel.  5. Remote h/o hypothyroidism: per patient,       TIMI Risk Score for Unstable Angina or Non-ST Elevation MI:   The patient's TIMI risk score is 2, which indicates a 8% risk of all cause mortality, new or recurrent myocardial infarction or need for urgent revascularization in the next 14 days.      Severity of Illness: The appropriate patient status for this patient is INPATIENT. Inpatient status is judged to be reasonable and necessary in order to provide the required intensity of service to ensure the patient's safety. The patient's presenting symptoms, physical exam findings, and initial radiographic and laboratory data in the context of their chronic comorbidities is felt to place them at high risk for further clinical deterioration. Furthermore, it is not anticipated that the patient will be medically stable for discharge from the hospital within 2 midnights of admission. The following factors support the patient status of inpatient.   " The patient's presenting symptoms include chest pain. " The worrisome physical exam findings include benign. " The initial radiographic and laboratory data are worrisome because of  frequent PVCs. " The chronic co-morbidities include hyperlipidemia.   * I certify that at the point of admission it is my clinical judgment that the patient will require inpatient hospital care spanning beyond 2 midnights from the point of admission due to high intensity of service, high risk for further deterioration and high  frequency of surveillance required.*    For questions or updates, please contact [facility] Please consult [email or web address] for contact info under     Ramond Dial, [place]  09/26/2020 1:13 PM

## 2020-09-26 NOTE — Progress Notes (Signed)
ANTICOAGULATION CONSULT NOTE - Follow Up Consult  Pharmacy Consult for heparin Indication: chest pain/ACS  Allergies  Allergen Reactions  . Codeine Itching  . Sulfacetamide Sodium Diarrhea    Patient Measurements: Height: 5\' 4"  (162.6 cm) Weight: 58.6 kg (129 lb 3.2 oz) IBW/kg (Calculated) : 54.7 Heparin Dosing Weight: 59.8 kg  Vital Signs: Temp: 97.9 F (36.6 C) (12/25 1659) Temp Source: Oral (12/25 1659) BP: 99/65 (12/25 1659) Pulse Rate: 74 (12/25 1659)  Labs: Recent Labs    09/26/20 0545 09/26/20 0550 09/26/20 0550 09/26/20 0832 09/26/20 1011 09/26/20 1356 09/26/20 1633  HGB  --  13.3  --   --   --   --   --   HCT  --  39.7  --   --   --   --   --   PLT  --  211  --   --   --   --   --   HEPARINUNFRC  --   --   --   --   --   --  0.32  CREATININE 0.97  --   --   --   --   --   --   TROPONINIHS  --  57*   < > 180* 187* 158*  --    < > = values in this interval not displayed.    Estimated Creatinine Clearance: 53.9 mL/min (by C-G formula based on SCr of 0.97 mg/dL).   Medications:  No anticoag PTA  Assessment: Pharmacy consulted to dose heparin in 59 yr old F with chest pain and elevated troponins/NSTEMI.  No anticoagulants PTA.  CBC WNL. SCr WNL   Initial heparin level therapeutic on 700 units/hr  Goal of Therapy:  Heparin level 0.3-0.7 units/ml Monitor platelets by anticoagulation protocol: Yes   Plan:  Continue heparin gtt at 700 units/hr F/u heparin level with AM labs to confirm F/u cards plan for cath Monday  Monday, PharmD Clinical Pharmacist ED Pharmacist Phone # [phone] 09/26/2020 5:37 PM

## 2020-09-26 NOTE — ED Notes (Signed)
Provider and RN notified of abnormal lab

## 2020-09-26 NOTE — ED Provider Notes (Signed)
[facility]-EMERGENCY DEPT Provider Note   CSN: [id number] Arrival date & time: 09/26/20  0442   Time seen 5:15 AM.  History Chief Complaint  Patient presents with  . Chest Pain    Janet Ramirez is a 59 y.o. female.  HPI   Patient states she woke up at 3 AM to go to the bathroom and she had pain in her left lateral chest that she describes as pressure and burning that lasted a few minutes and now it comes and goes and lasts less than a minute.  She states it radiates into her left arm down to her left shoulder and she has pressure in her back.  She states nothing she does makes it feel worse, burping makes it feel little better.  She denies having acid reflux in her throat.  She states also her heart was beating fast today.  She has had palpitations before but states it is never lasted this long.  She states she felt like her heart was racing for about 30 minutes.  Her resting heart rate during my exam was 110.  She denies feeling dizzy or lightheaded with the palpitations.  She states she still feels better but she still having some back pain.  She states that the symptoms have been ongoing for months.  She was recently seen by cardiology and is scheduled to have some tests done the first week of January.  She states her father has had a cardiac stent in the pacemaker and all of his family have heart issues.  She states she personally does not have a history of hypertension or diabetes.  She states her last physical her cholesterol was elevated more than normal but she was not started on medications.  She denies cigarette smoker being around secondhand smoke.  PCP Mitzi Hansen, NP   History reviewed. No pertinent past medical history.  There are no problems to display for this patient.   History reviewed. No pertinent surgical history.   OB History   No obstetric history on file.     No family history on file.  Social History   Tobacco Use  . Smoking  status: Never Smoker  . Smokeless tobacco: Never Used  Substance Use Topics  . Alcohol use: Not Currently  . Drug use: Never  unemployed  Home Medications Prior to Admission medications   Medication Sig Start Date End Date Taking? Authorizing Provider  Ascorbic Acid (VITAMIN C) 1000 MG tablet Take 1,000 mg by mouth daily. 1 Tablet Daily    [provider]  cholecalciferol (VITAMIN D3) 25 MCG (1000 UNIT) tablet Take 1,000 Units by mouth daily. 1 Tablet Daily    [provider]  zinc gluconate 50 MG tablet Take 50 mg by mouth daily. 1 Tablet Daily    [provider]    Allergies    Codeine and Sulfacetamide sodium  Review of Systems   Review of Systems  All other systems reviewed and are negative.   Physical Exam Updated Vital Signs BP 130/80 (BP Location: Right Arm)   Pulse 98   Temp 97.9 F (36.6 C) (Oral)   Resp 16   Ht 5\' 4"  (1.626 m)   Wt 59.8 kg   SpO2 99%   BMI 22.62 kg/m   Physical Exam Vitals and nursing note reviewed.  Constitutional:      General: She is not in acute distress.    Appearance: Normal appearance. She is normal weight. She is not ill-appearing or  toxic-appearing.  HENT:     Head: Normocephalic and atraumatic.     Right Ear: External ear normal.     Left Ear: External ear normal.  Eyes:     Extraocular Movements: Extraocular movements intact.     Conjunctiva/sclera: Conjunctivae normal.  Cardiovascular:     Rate and Rhythm: Regular rhythm. Tachycardia present.     Pulses: Normal pulses.     Heart sounds: Normal heart sounds. No murmur heard.   Pulmonary:     Effort: Pulmonary effort is normal. No respiratory distress.     Breath sounds: Normal breath sounds. No stridor. No wheezing, rhonchi or rales.  Chest:       Comments: Area of pain noted Musculoskeletal:        General: Normal range of motion.     Cervical back: Normal range of motion and neck supple.  Skin:    General: Skin is warm and dry.   Neurological:     General: No focal deficit present.     Mental Status: She is alert and oriented to person, place, and time.     Cranial Nerves: No cranial nerve deficit.  Psychiatric:        Mood and Affect: Mood is anxious.        Speech: Speech is delayed.        Behavior: Behavior is slowed.     ED Results / Procedures / Treatments   Labs (all labs ordered are listed, but only abnormal results are displayed) Results for orders placed or performed during the hospital encounter of 09/26/20  CBC  Result Value Ref Range   WBC 4.6 4.0 - 10.5 K/uL   RBC 4.14 3.87 - 5.11 MIL/uL   Hemoglobin 13.3 12.0 - 15.0 g/dL   HCT 16.6 06.3 - 01.6 %   MCV 95.9 80.0 - 100.0 fL   MCH 32.1 26.0 - 34.0 pg   MCHC 33.5 30.0 - 36.0 g/dL   RDW 01.0 93.2 - 35.5 %   Platelets 211 150 - 400 K/uL   nRBC 0.0 0.0 - 0.2 %  Comprehensive metabolic panel  Result Value Ref Range   Sodium 142 135 - 145 mmol/L   Potassium 3.5 3.5 - 5.1 mmol/L   Chloride 103 98 - 111 mmol/L   CO2 27 22 - 32 mmol/L   Glucose, Bld 115 (H) 70 - 99 mg/dL   BUN 13 6 - 20 mg/dL   Creatinine, Ser 7.32 0.44 - 1.00 mg/dL   Calcium 9.7 8.9 - 20.2 mg/dL   Total Protein 6.9 6.5 - 8.1 g/dL   Albumin 4.5 3.5 - 5.0 g/dL   AST 26 15 - 41 U/L   ALT 19 0 - 44 U/L   Alkaline Phosphatase 68 38 - 126 U/L   Total Bilirubin 0.4 0.3 - 1.2 mg/dL   GFR, Estimated >54 >27 mL/min   Anion gap 12 5 - 15  Magnesium  Result Value Ref Range   Magnesium 2.2 1.7 - 2.4 mg/dL  D-dimer, quantitative  Result Value Ref Range   D-Dimer, Quant <0.27 0.00 - 0.50 ug/mL-FEU  Troponin I (High Sensitivity)  Result Value Ref Range   Troponin I (High Sensitivity) 57 (H) <18 ng/L     Laboratory interpretation all normal except elevated first troponin   EKG EKG Interpretation  Date/Time:  Saturday September 26 2020 04:52:29 EST Ventricular Rate:  112 PR Interval:    QRS Duration: 94 QT Interval:  330 QTC Calculation: 451 R Axis:  49 Text  Interpretation: Sinus tachycardia Ventricular premature complex Consider right atrial enlargement LVH with secondary repolarization abnormality Anterior infarct, old No significant change since last tracing 09/14/2020 Confirmed by Devoria Albe ([id number]) on 09/26/2020 4:56:37 AM   Radiology DG Chest 2 View  Result Date: 09/26/2020 CLINICAL DATA:  Chest pain EXAM: CHEST - 2 VIEW COMPARISON:  None. FINDINGS: Borderline cardiomegaly. Negative aortic and hilar contours. There is no edema, consolidation, effusion, or pneumothorax. IMPRESSION: No evidence of active disease. Electronically Signed   By: Marnee Spring M.D.   On: 09/26/2020 05:55    Procedures Procedures (including critical care time)  Medications Ordered in ED Medications  nitroGLYCERIN (NITROGLYN) 2 % ointment 0.5 inch (has no administration in time range)  acetaminophen (TYLENOL) tablet 650 mg (650 mg Oral Given 09/26/20 0649)  metoprolol tartrate (LOPRESSOR) tablet 25 mg (25 mg Oral Given 09/26/20 3086)    ED Course  I have reviewed the triage vital signs and the nursing notes.  Pertinent labs & imaging results that were available during my care of the patient were reviewed by me and considered in my medical decision making (see chart for details).    MDM Rules/Calculators/A&P                          Patient had nitroglycerin paste half inch plus acetaminophen for potential nitroglycerin headache given.  She was given metoprolol 25 mg orally for her tachycardia.  7:00 AM I talked to the patient and her husband.  We discussed her test results and need to get the delta troponin.  When she was placed back on the monitor her heart rate is still 102-104.  Pt turned over to Dr Lockie Mola at change of shift to get results of her delta troponin and disposition  Final Clinical Impression(s) / ED Diagnoses Final diagnoses:  Atypical chest pain  Elevated troponin    Rx / DC Orders  Disposition pending  Devoria Albe, MD,  Concha Pyo, MD 09/26/20 [phone]

## 2020-09-26 NOTE — Progress Notes (Signed)
  Echocardiogram 2D Echocardiogram has been performed.  Delcie Roch 09/26/2020, 3:50 PM

## 2020-09-26 NOTE — ED Triage Notes (Signed)
Patient states she woke up around 0300 to use the restroom and had left sided chest pain that radiated down her arm. Patient states arm pain only at this time. Denies known cardiac history, but recently saw a cardiologist for similar symptoms.

## 2020-09-26 NOTE — Progress Notes (Addendum)
ANTICOAGULATION CONSULT NOTE - Follow Up Consult  Pharmacy Consult for heparin Indication: chest pain/ACS  Allergies  Allergen Reactions  . Codeine Itching  . Sulfacetamide Sodium Diarrhea    Patient Measurements: Height: 5\' 4"  (162.6 cm) Weight: 59.8 kg (131 lb 12.8 oz) IBW/kg (Calculated) : 54.7 Heparin Dosing Weight: 59.8 kg  Vital Signs: Temp: 97.9 F (36.6 C) (12/25 0458) Temp Source: Oral (12/25 0458) BP: 120/68 (12/25 0900) Pulse Rate: 58 (12/25 0900)  Labs: Recent Labs    09/26/20 0545 09/26/20 0550 09/26/20 0832  HGB  --  13.3  --   HCT  --  39.7  --   PLT  --  211  --   CREATININE 0.97  --   --   TROPONINIHS  --  57* 180*    Estimated Creatinine Clearance: 53.9 mL/min (by C-G formula based on SCr of 0.97 mg/dL).   Medications:  No anticoag PTA  Assessment: Pharmacy consulted to dose heparin in 59 yr old F with chest pain and elevated troponins/NSTEMI.  No anticoagulants PTA.  CBC WNL. SCr WNL  Goal of Therapy:  Heparin level 0.3-0.7 units/ml Monitor platelets by anticoagulation protocol: Yes   Plan:  Give 3000 units bolus x 1 Start heparin infusion at 700 units/hr Check anti-Xa level in 6 hours and daily while on heparin Continue to monitor H&H and platelets   46, Pharm.D 09/26/2020 10:00 AM

## 2020-09-26 NOTE — ED Notes (Signed)
[facility] called for transport. 

## 2020-09-26 NOTE — Procedures (Signed)
Echo attempted in [facility] ED. Before test could begin, ED nurse stated care link was to less than 10 minutes out to transfer patient to [facility]. Echo will be attempted at [facility].

## 2020-09-27 DIAGNOSIS — I519 Heart disease, unspecified: Secondary | ICD-10-CM

## 2020-09-27 DIAGNOSIS — I214 Non-ST elevation (NSTEMI) myocardial infarction: Secondary | ICD-10-CM | POA: Diagnosis not present

## 2020-09-27 DIAGNOSIS — I493 Ventricular premature depolarization: Secondary | ICD-10-CM | POA: Diagnosis not present

## 2020-09-27 LAB — CBC
HCT: 35.7 % — ABNORMAL LOW (ref 36.0–46.0)
Hemoglobin: 11.9 g/dL — ABNORMAL LOW (ref 12.0–15.0)
MCH: 31.7 pg (ref 26.0–34.0)
MCHC: 33.3 g/dL (ref 30.0–36.0)
MCV: 95.2 fL (ref 80.0–100.0)
Platelets: 214 10*3/uL (ref 150–400)
RBC: 3.75 MIL/uL — ABNORMAL LOW (ref 3.87–5.11)
RDW: 12.6 % (ref 11.5–15.5)
WBC: 4.7 10*3/uL (ref 4.0–10.5)
nRBC: 0 % (ref 0.0–0.2)

## 2020-09-27 LAB — BASIC METABOLIC PANEL
Anion gap: 9 (ref 5–15)
BUN: 8 mg/dL (ref 6–20)
CO2: 27 mmol/L (ref 22–32)
Calcium: 9.2 mg/dL (ref 8.9–10.3)
Chloride: 106 mmol/L (ref 98–111)
Creatinine, Ser: 0.94 mg/dL (ref 0.44–1.00)
GFR, Estimated: >60 mL/min (ref >60)
Glucose, Bld: 101 mg/dL — ABNORMAL HIGH (ref 70–99)
Potassium: 3.9 mmol/L (ref 3.5–5.1)
Sodium: 142 mmol/L (ref 135–145)

## 2020-09-27 LAB — HEMOGLOBIN A1C
Hgb A1c MFr Bld: 5.1 % (ref 4.8–5.6)
Mean Plasma Glucose: 99.67 mg/dL

## 2020-09-27 LAB — LIPID PANEL
Cholesterol: 189 mg/dL (ref 0–200)
HDL: 76 mg/dL (ref >40)
LDL Cholesterol: 104 mg/dL — ABNORMAL HIGH (ref 0–99)
Total CHOL/HDL Ratio: 2.5 RATIO
Triglycerides: 45 mg/dL (ref <150)
VLDL: 9 mg/dL (ref 0–40)

## 2020-09-27 LAB — HEPARIN LEVEL (UNFRACTIONATED): Heparin Unfractionated: 0.31 IU/mL (ref 0.30–0.70)

## 2020-09-27 MED ORDER — CARVEDILOL 3.125 MG PO TABS
3.1250 mg | ORAL_TABLET | Freq: Two times a day (BID) | ORAL | Status: DC
  Administered 2020-09-27 (×2): 3.125 mg via ORAL
  Filled 2020-09-27 (×2): qty 1

## 2020-09-27 MED ORDER — METOPROLOL SUCCINATE ER 25 MG PO TB24
25.0000 mg | ORAL_TABLET | Freq: Every day | ORAL | Status: DC
  Filled 2020-09-27: qty 1

## 2020-09-27 MED ORDER — ANGIOPLASTY BOOK
Freq: Once | Status: AC
  Filled 2020-09-27: qty 1

## 2020-09-27 MED ORDER — ASPIRIN 81 MG PO CHEW
81.0000 mg | CHEWABLE_TABLET | ORAL | Status: AC
  Administered 2020-09-28: 81 mg via ORAL
  Filled 2020-09-27: qty 1

## 2020-09-27 MED ORDER — SACUBITRIL-VALSARTAN 24-26 MG PO TABS
1.0000 | ORAL_TABLET | Freq: Two times a day (BID) | ORAL | Status: DC
  Administered 2020-09-27 – 2020-09-29 (×5): 1 via ORAL
  Filled 2020-09-27 (×5): qty 1

## 2020-09-27 MED ORDER — SODIUM CHLORIDE 0.9 % IV SOLN: 250.0000 mL | INTRAVENOUS | Status: DC | PRN

## 2020-09-27 MED ORDER — SODIUM CHLORIDE 0.9% FLUSH: 3.0000 mL | INTRAVENOUS | Status: DC | PRN

## 2020-09-27 MED ORDER — SODIUM CHLORIDE 0.9 % IV SOLN
INTRAVENOUS | Status: DC
Start: 2020-09-28 — End: 2020-09-28

## 2020-09-27 MED ORDER — ISOSORBIDE MONONITRATE ER 30 MG PO TB24
15.0000 mg | ORAL_TABLET | Freq: Every day | ORAL | Status: DC
  Administered 2020-09-27: 15 mg via ORAL
  Filled 2020-09-27: qty 1

## 2020-09-27 NOTE — Progress Notes (Signed)
Progress Note  Patient Name: Janet Ramirez Date of Encounter: 09/27/2020  [facility] Cardiologist: Little Ishikawa, MD   Subjective   Janet Ramirez was admitted with chest pain and shortness of breath.  She had a non-STEMI with troponins that went up to the 150s and a BNP of 600.  She is currently pain-free on IV heparin.  Plan for right left heart cath tomorrow.  Inpatient Medications    Scheduled Meds: . aspirin EC  81 mg Oral Daily  . atorvastatin  80 mg Oral Daily  . metoprolol succinate  25 mg Oral Daily  . sacubitril-valsartan  1 tablet Oral BID  . sodium chloride flush  3 mL Intravenous Q12H   Continuous Infusions: . heparin 700 Units/hr (09/26/20 1010)   PRN Meds: acetaminophen, nitroGLYCERIN, ondansetron (ZOFRAN) IV   Vital Signs    Vitals:   09/26/20 1215 09/26/20 1659 09/26/20 1927 09/27/20 0545  BP: 116/77 99/65 114/66 (!) 115/59  Pulse: 78 74 73 60  Resp: 16     Temp: 98.6 F (37 C) 97.9 F (36.6 C)  98.2 F (36.8 C)  TempSrc: Oral Oral  Oral  SpO2: 96%   97%  Weight: 58.6 kg     Height: 5\' 4"  (1.626 m)       Intake/Output Summary (Last 24 hours) at 09/27/2020 0908 Last data filed at 09/26/2020 1502 Gross per 24 hour  Intake 64.3 ml  Output --  Net 64.3 ml   Last 3 Weights 09/26/2020 09/26/2020 09/11/2020  Weight (lbs) 129 lb 3.2 oz 131 lb 12.8 oz 136 lb  Weight (kg) 58.605 kg 59.784 kg 61.689 kg      Telemetry    Sinus rhythm with PVCs- Personally Reviewed  ECG    Not performed today- Personally Reviewed  Physical Exam   GEN: No acute distress.   Neck: No JVD Cardiac: RRR, no murmurs, rubs, or gallops.  Respiratory: Clear to auscultation bilaterally. GI: Soft, nontender, non-distended  MS: No edema; No deformity. Neuro:  Nonfocal  Psych: Normal affect   Labs    High Sensitivity Troponin:   Recent Labs  Lab 09/26/20 0550 09/26/20 0832 09/26/20 1011 09/26/20 1356  TROPONINIHS 57* 180* 187* 158*       Chemistry Recent Labs  Lab 09/26/20 0545 09/27/20 0501  NA 142 142  K 3.5 3.9  CL 103 106  CO2 27 27  GLUCOSE 115* 101*  BUN 13 8  CREATININE 0.97 0.94  CALCIUM 9.7 9.2  PROT 6.9  --   ALBUMIN 4.5  --   AST 26  --   ALT 19  --   ALKPHOS 68  --   BILITOT 0.4  --   GFRNONAA >60 >60  ANIONGAP 12 9     Hematology Recent Labs  Lab 09/26/20 0550 09/27/20 0501  WBC 4.6 4.7  RBC 4.14 3.75*  HGB 13.3 11.9*  HCT 39.7 35.7*  MCV 95.9 95.2  MCH 32.1 31.7  MCHC 33.5 33.3  RDW 12.1 12.6  PLT 211 214    BNP Recent Labs  Lab 09/26/20 1633  BNP 608.1*     DDimer  Recent Labs  Lab 09/26/20 0545  DDIMER <0.27     Radiology    DG Chest 2 View  Result Date: 09/26/2020 CLINICAL DATA:  Chest pain EXAM: CHEST - 2 VIEW COMPARISON:  None. FINDINGS: Borderline cardiomegaly. Negative aortic and hilar contours. There is no edema, consolidation, effusion, or pneumothorax. IMPRESSION: No evidence of active disease. Electronically  Signed   By: Marnee Spring M.D.   On: 09/26/2020 05:55   ECHOCARDIOGRAM COMPLETE  Result Date: 09/26/2020    ECHOCARDIOGRAM REPORT   Patient Name:   Janet Ramirez  Date of Exam: 09/26/2020 Medical Rec #:  [id number]  Height:       64.0 in Accession #:    [id number] Weight:       129.2 lb Date of Birth:  1961-05-29   BSA:          1.625 m Patient Age:    59 years   BP:           116/77 mmHg Patient Gender: F          HR:           85 bpm. Exam Location:  Inpatient Procedure: 2D Echo Indications:    NSTEMI  History:        Patient has no prior history of Echocardiogram examinations.  Sonographer:    Delcie Roch Referring Phys: [id number]  THOMAS O'NEAL IMPRESSIONS  1. Frequent PVCs. EF severely reduced with global HK ~25-30%.  2. Left ventricular ejection fraction, by estimation, is 25 to 30%. The left ventricle has severely decreased function. The left ventricle demonstrates global hypokinesis. The left ventricular internal cavity size was moderately  to severely dilated. Left ventricular diastolic parameters are indeterminate.  3. Right ventricular systolic function is normal. The right ventricular size is normal. There is normal pulmonary artery systolic pressure. The estimated right ventricular systolic pressure is 28.6 mmHg.  4. Left atrial size was mildly dilated.  5. The mitral valve is grossly normal. Mild to moderate mitral valve regurgitation. No evidence of mitral stenosis.  6. The aortic valve is tricuspid. Aortic valve regurgitation is mild. No aortic stenosis is present.  7. The inferior vena cava is normal in size with <50% respiratory variability, suggesting right atrial pressure of 8 mmHg. FINDINGS  Left Ventricle: Left ventricular ejection fraction, by estimation, is 25 to 30%. The left ventricle has severely decreased function. The left ventricle demonstrates global hypokinesis. The left ventricular internal cavity size was moderately to severely  dilated. There is no left ventricular hypertrophy. Left ventricular diastolic parameters are indeterminate. Right Ventricle: The right ventricular size is normal. No increase in right ventricular wall thickness. Right ventricular systolic function is normal. There is normal pulmonary artery systolic pressure. The tricuspid regurgitant velocity is 2.27 m/s, and  with an assumed right atrial pressure of 8 mmHg, the estimated right ventricular systolic pressure is 28.6 mmHg. Left Atrium: Left atrial size was mildly dilated. Right Atrium: Right atrial size was normal in size. Pericardium: Trivial pericardial effusion is present. Mitral Valve: The mitral valve is grossly normal. Mild to moderate mitral valve regurgitation. No evidence of mitral valve stenosis. Tricuspid Valve: The tricuspid valve is grossly normal. Tricuspid valve regurgitation is mild . No evidence of tricuspid stenosis. Aortic Valve: The aortic valve is tricuspid. Aortic valve regurgitation is mild. No aortic stenosis is present. Pulmonic  Valve: The pulmonic valve was grossly normal. Pulmonic valve regurgitation is trivial. No evidence of pulmonic stenosis. Aorta: The aortic root and ascending aorta are structurally normal, with no evidence of dilitation. Venous: The inferior vena cava is normal in size with less than 50% respiratory variability, suggesting right atrial pressure of 8 mmHg. IAS/Shunts: The atrial septum is grossly normal.  LEFT VENTRICLE PLAX 2D LVIDd:         6.50 cm LVIDs:  5.40 cm LV PW:         0.70 cm LV IVS:        0.80 cm LVOT diam:     2.00 cm LVOT Area:     3.14 cm  LV Volumes (MOD) LV vol d, MOD A4C: 113.0 ml LV vol s, MOD A4C: 86.3 ml LV SV MOD A4C:     113.0 ml IVC IVC diam: 1.70 cm LEFT ATRIUM             Index       RIGHT ATRIUM           Index LA diam:        4.00 cm 2.46 cm/m  RA Area:     12.00 cm LA Vol (A2C):   81.5 ml 50.16 ml/m RA Volume:   26.50 ml  16.31 ml/m LA Vol (A4C):   62.3 ml 38.34 ml/m LA Biplane Vol: 73.4 ml 45.17 ml/m   AORTA Ao Root diam: 3.00 cm Ao Asc diam:  3.60 cm MITRAL VALVE               TRICUSPID VALVE MV Area (PHT): 4.60 cm    TR Peak grad:   20.6 mmHg MV Decel Time: 165 msec    TR Vmax:        227.00 cm/s MV E velocity: 56.10 cm/s MV A velocity: 57.00 cm/s  SHUNTS MV E/A ratio:  0.98        Systemic Diam: 2.00 cm Lennie Odor MD Electronically signed by Lennie Odor MD Signature Date/Time: 09/26/2020/4:03:33 PM    Final     Cardiac Studies   2D echocardiogram (09/26/2020)  IMPRESSIONS    1. Frequent PVCs. EF severely reduced with global HK ~25-30%.  2. Left ventricular ejection fraction, by estimation, is 25 to 30%. The  left ventricle has severely decreased function. The left ventricle  demonstrates global hypokinesis. The left ventricular internal cavity size  was moderately to severely dilated.  Left ventricular diastolic parameters are indeterminate.  3. Right ventricular systolic function is normal. The right ventricular  size is normal. There is  normal pulmonary artery systolic pressure. The  estimated right ventricular systolic pressure is 28.6 mmHg.  4. Left atrial size was mildly dilated.  5. The mitral valve is grossly normal. Mild to moderate mitral valve  regurgitation. No evidence of mitral stenosis.  6. The aortic valve is tricuspid. Aortic valve regurgitation is mild. No  aortic stenosis is present.  7. The inferior vena cava is normal in size with <50% respiratory  variability, suggesting right atrial pressure of 8 mmHg.   Patient Profile     59 y.o. married Caucasian female with no prior cardiac history who was admitted with chest pain, shortness of breath and non-STEMI.  She does have frequent PVCs.  Assessment & Plan    1: Non-STEMI-troponins rose to the mid 100 range.  She is currently pain-free on IV heparin although was complaining some off and off chest pain or shortness of breath.  I am going to begin her on a low-dose oral nitrate.  Plan diagnostic coronary angiogram tomorrow.  I have reviewed the risks, indications, and alternatives to cardiac catheterization, possible angioplasty, and stenting with the patient. Risks include but are not limited to bleeding, infection, vascular injury, stroke, myocardial infection, arrhythmia, kidney injury, radiation-related injury in the case of prolonged fluoroscopy use, emergency cardiac surgery, and death. The patient understands the risks of serious complication is 1-2 in 1000 with  diagnostic cardiac cath and 1-2% or less with angioplasty/stenting.  2: Tachypalpitations-frequent PVCs in trigeminy.  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  We will start low-dose beta-blockade.  3: LV dysfunction-EF 25 to 30% with mild to moderate MR.  Unclear etiology.  Plan right left heart cath tomorrow.  After that, start guideline directed optimal medical therapy including beta-blockade, Entresto  and spironolactone.  For questions or updates, please contact [facility] Please consult [email or web address] for contact info under        Signed, Nanetta Batty, MD  09/27/2020, 9:08 AM

## 2020-09-27 NOTE — Progress Notes (Signed)
ANTICOAGULATION CONSULT NOTE - Follow Up Consult  Pharmacy Consult for heparin Indication: chest pain/ACS  Allergies  Allergen Reactions  . Codeine Itching  . Sulfacetamide Sodium Diarrhea    Patient Measurements: Height: 5\' 4"  (162.6 cm) Weight: 58.6 kg (129 lb 3.2 oz) IBW/kg (Calculated) : 54.7 Heparin Dosing Weight: 59.8 kg  Vital Signs: Temp: 98.2 F (36.8 C) (12/26 0545) Temp Source: Oral (12/26 0545) BP: 115/59 (12/26 0545) Pulse Rate: 60 (12/26 0545)  Labs: Recent Labs    09/26/20 0545 09/26/20 0550 09/26/20 0550 09/26/20 0832 09/26/20 1011 09/26/20 1356 09/26/20 1633 09/27/20 0501  HGB  --  13.3  --   --   --   --   --  11.9*  HCT  --  39.7  --   --   --   --   --  35.7*  PLT  --  211  --   --   --   --   --  214  HEPARINUNFRC  --   --   --   --   --   --  0.32 0.31  CREATININE 0.97  --   --   --   --   --   --  0.94  TROPONINIHS  --  57*   < > 180* 187* 158*  --   --    < > = values in this interval not displayed.    Estimated Creatinine Clearance: 55.6 mL/min (by C-G formula based on SCr of 0.94 mg/dL).   Medications:  No anticoag PTA  Assessment: Pharmacy consulted to dose heparin in 59 yr old F with chest pain and elevated troponins/NSTEMI.  No anticoagulants PTA.    Heparin level therapeutic on 700 units/hr, but at very low end of range x2. Will slightly increase heparin rate to prevent heparin level dropping too low. Slight drop in Hgb 13.3 > 11.9, plts wnl. No bleeding per RN.   Goal of Therapy:  Heparin level 0.3-0.7 units/ml Monitor platelets by anticoagulation protocol: Yes   Plan:  Increase heparin gtt to 750 units/hr Daily HL, CBC while on heparin F/u cards plan for cath Monday  Friday, PharmD PGY1 Pharmacy Resident 09/27/2020 7:59 AM  Please check [email or web address] for unit-specific pharmacy phone numbers.

## 2020-09-28 ENCOUNTER — Encounter (HOSPITAL_COMMUNITY)
Admission: EM | Disposition: A | Discharge status: Home / Self Care | Payer: Self-pay | Source: Home / Self Care | Attending: Cardiovascular Disease

## 2020-09-28 ENCOUNTER — Encounter (HOSPITAL_COMMUNITY): Payer: Self-pay | Admitting: Cardiology

## 2020-09-28 DIAGNOSIS — R002 Palpitations: Secondary | ICD-10-CM | POA: Diagnosis not present

## 2020-09-28 DIAGNOSIS — I5021 Acute systolic (congestive) heart failure: Secondary | ICD-10-CM

## 2020-09-28 DIAGNOSIS — I214 Non-ST elevation (NSTEMI) myocardial infarction: Secondary | ICD-10-CM | POA: Diagnosis not present

## 2020-09-28 DIAGNOSIS — R778 Other specified abnormalities of plasma proteins: Secondary | ICD-10-CM

## 2020-09-28 LAB — CBC
HCT: 39.5 % (ref 36.0–46.0)
Hemoglobin: 12.5 g/dL (ref 12.0–15.0)
MCH: 30.9 pg (ref 26.0–34.0)
MCHC: 31.6 g/dL (ref 30.0–36.0)
MCV: 97.8 fL (ref 80.0–100.0)
Platelets: 218 10*3/uL (ref 150–400)
RBC: 4.04 MIL/uL (ref 3.87–5.11)
RDW: 12.4 % (ref 11.5–15.5)
WBC: 5.3 10*3/uL (ref 4.0–10.5)
nRBC: 0 % (ref 0.0–0.2)

## 2020-09-28 LAB — GLUCOSE, CAPILLARY: Glucose-Capillary: 93 mg/dL (ref 70–99)

## 2020-09-28 LAB — HEPARIN LEVEL (UNFRACTIONATED): Heparin Unfractionated: 0.28 IU/mL — ABNORMAL LOW (ref 0.30–0.70)

## 2020-09-28 SURGERY — RIGHT/LEFT HEART CATH AND CORONARY ANGIOGRAPHY
Anesthesia: LOCAL

## 2020-09-28 MED ORDER — CARVEDILOL 6.25 MG PO TABS
6.2500 mg | ORAL_TABLET | Freq: Two times a day (BID) | ORAL | Status: DC
  Administered 2020-09-28 – 2020-09-29 (×3): 6.25 mg via ORAL
  Filled 2020-09-28 (×3): qty 1

## 2020-09-28 MED ORDER — FENTANYL CITRATE (PF) 100 MCG/2ML IJ SOLN
INTRAMUSCULAR | Status: AC
  Filled 2020-09-28: qty 2

## 2020-09-28 MED ORDER — MIDAZOLAM HCL 2 MG/2ML IJ SOLN
INTRAMUSCULAR | Status: AC
  Filled 2020-09-28: qty 2

## 2020-09-28 MED ORDER — VERAPAMIL HCL 2.5 MG/ML IV SOLN
INTRAVENOUS | Status: DC | PRN
  Administered 2020-09-28: 10 mL via INTRA_ARTERIAL

## 2020-09-28 MED ORDER — LIDOCAINE HCL (PF) 1 % IJ SOLN
INTRAMUSCULAR | Status: AC
  Filled 2020-09-28: qty 30

## 2020-09-28 MED ORDER — SODIUM CHLORIDE 0.9% FLUSH: 3.0000 mL | INTRAVENOUS | Status: DC | PRN

## 2020-09-28 MED ORDER — ENOXAPARIN SODIUM 40 MG/0.4ML ~~LOC~~ SOLN: 40.0000 mg | SUBCUTANEOUS | Status: DC

## 2020-09-28 MED ORDER — LIDOCAINE HCL (PF) 1 % IJ SOLN
INTRAMUSCULAR | Status: DC | PRN
  Administered 2020-09-28: 5 mL

## 2020-09-28 MED ORDER — IOHEXOL 350 MG/ML SOLN
INTRAVENOUS | Status: DC | PRN
  Administered 2020-09-28: 50 mL

## 2020-09-28 MED ORDER — VERAPAMIL HCL 2.5 MG/ML IV SOLN
INTRAVENOUS | Status: AC
  Filled 2020-09-28: qty 2

## 2020-09-28 MED ORDER — HEPARIN (PORCINE) IN NACL 1000-0.9 UT/500ML-% IV SOLN
INTRAVENOUS | Status: DC | PRN
  Administered 2020-09-28 (×2): 500 mL

## 2020-09-28 MED ORDER — HEPARIN SODIUM (PORCINE) 1000 UNIT/ML IJ SOLN
INTRAMUSCULAR | Status: AC
  Filled 2020-09-28: qty 1

## 2020-09-28 MED ORDER — FENTANYL CITRATE (PF) 100 MCG/2ML IJ SOLN
INTRAMUSCULAR | Status: DC | PRN
  Administered 2020-09-28 (×2): 25 ug via INTRAVENOUS

## 2020-09-28 MED ORDER — HEPARIN SODIUM (PORCINE) 1000 UNIT/ML IJ SOLN
INTRAMUSCULAR | Status: DC | PRN
  Administered 2020-09-28: 3000 [IU] via INTRAVENOUS

## 2020-09-28 MED ORDER — SODIUM CHLORIDE 0.9% FLUSH: 3.0000 mL | Freq: Two times a day (BID) | INTRAVENOUS | Status: DC

## 2020-09-28 MED ORDER — SODIUM CHLORIDE 0.9 % IV SOLN: 250.0000 mL | INTRAVENOUS | Status: DC | PRN

## 2020-09-28 MED ORDER — SODIUM CHLORIDE 0.9 % WEIGHT BASED INFUSION: 1.0000 mL/kg/h | INTRAVENOUS | Status: AC

## 2020-09-28 MED ORDER — HEPARIN (PORCINE) IN NACL 1000-0.9 UT/500ML-% IV SOLN
INTRAVENOUS | Status: AC
  Filled 2020-09-28: qty 1000

## 2020-09-28 MED ORDER — MIDAZOLAM HCL 2 MG/2ML IJ SOLN
INTRAMUSCULAR | Status: DC | PRN
  Administered 2020-09-28 (×2): 1 mg via INTRAVENOUS

## 2020-09-28 SURGICAL SUPPLY — 12 items
CATH 5FR JL3.5 JR4 ANG PIG MP (CATHETERS) ×2 IMPLANT
CATH BALLN WEDGE 5F 110CM (CATHETERS) ×2 IMPLANT
DEVICE RAD COMP TR BAND LRG (VASCULAR PRODUCTS) ×2 IMPLANT
GLIDESHEATH SLEND SS 6F .021 (SHEATH) ×2 IMPLANT
GUIDEWIRE INQWIRE 1.5J.035X260 (WIRE) ×1 IMPLANT
INQWIRE 1.5J .035X260CM (WIRE) ×2
KIT HEART LEFT (KITS) ×2 IMPLANT
PACK CARDIAC CATHETERIZATION (CUSTOM PROCEDURE TRAY) ×2 IMPLANT
SHEATH GLIDE SLENDER 4/5FR (SHEATH) ×2 IMPLANT
SHEATH PROBE COVER 6X72 (BAG) ×2 IMPLANT
TRANSDUCER W/STOPCOCK (MISCELLANEOUS) ×2 IMPLANT
TUBING CIL FLEX 10 FLL-RA (TUBING) ×2 IMPLANT

## 2020-09-28 NOTE — Interval H&P Note (Signed)
History and Physical Interval Note:  09/28/2020 10:53 AM  Janet Ramirez  has presented today for surgery, with the diagnosis of NSTEMI.  The various methods of treatment have been discussed with the patient and family. After consideration of risks, benefits and other options for treatment, the patient has consented to  Procedure(s): RIGHT/LEFT HEART CATH AND CORONARY ANGIOGRAPHY (N/A) as a surgical intervention.  The patient's history has been reviewed, patient examined, no change in status, stable for surgery.  I have reviewed the patient's chart and labs.  Questions were answered to the patient's satisfaction.   Cath Lab Visit (complete for each Cath Lab visit)  Clinical Evaluation Leading to the Procedure:   ACS: Yes.    Non-ACS:    Anginal Classification: CCS III  Anti-ischemic medical therapy: Minimal Therapy (1 class of medications)  Non-Invasive Test Results: No non-invasive testing performed  Prior CABG: No previous CABG        Theron Arista [facility] 09/28/2020 10:53 AM

## 2020-09-28 NOTE — Progress Notes (Signed)
Progress Note  Patient Name: Janet Ramirez Date of Encounter: 09/28/2020  [facility] Cardiologist: Little Ishikawa, MD   Subjective   No CP or dyspnea  Inpatient Medications    Scheduled Meds: . aspirin EC  81 mg Oral Daily  . atorvastatin  80 mg Oral Daily  . carvedilol  3.125 mg Oral BID WC  . isosorbide mononitrate  15 mg Oral Daily  . sacubitril-valsartan  1 tablet Oral BID  . sodium chloride flush  3 mL Intravenous Q12H   Continuous Infusions: . sodium chloride    . sodium chloride 50 mL/hr at 09/28/20 0445  . heparin 850 Units/hr (09/28/20 0448)   PRN Meds: sodium chloride, acetaminophen, nitroGLYCERIN, ondansetron (ZOFRAN) IV, sodium chloride flush   Vital Signs    Vitals:   09/27/20 0545 09/27/20 2000 09/28/20 0439 09/28/20 0500  BP: (!) 115/59 110/69 (!) 120/58   Pulse: 60 69 (!) 57   Resp:  18 18   Temp: 98.2 F (36.8 C) 97.7 F (36.5 C) 97.6 F (36.4 C)   TempSrc: Oral Oral Oral   SpO2: 97% 98% 99%   Weight:    57.2 kg  Height:       No intake or output data in the 24 hours ending 09/28/20 0758 Last 3 Weights 09/28/2020 09/26/2020 09/26/2020  Weight (lbs) 126 lb 129 lb 3.2 oz 131 lb 12.8 oz  Weight (kg) 57.153 kg 58.605 kg 59.784 kg      Telemetry    Sinus with PVCs and PAT - Personally Reviewed   Physical Exam   GEN: No acute distress.   Neck: No JVD Cardiac: RRR, no murmurs, rubs, or gallops.  Respiratory: Clear to auscultation bilaterally. GI: Soft, nontender, non-distended  MS: No edema Neuro:  Nonfocal  Psych: Normal affect   Labs    High Sensitivity Troponin:   Recent Labs  Lab 09/26/20 0550 09/26/20 0832 09/26/20 1011 09/26/20 1356  TROPONINIHS 57* 180* 187* 158*      Chemistry Recent Labs  Lab 09/26/20 0545 09/27/20 0501  NA 142 142  K 3.5 3.9  CL 103 106  CO2 27 27  GLUCOSE 115* 101*  BUN 13 8  CREATININE 0.97 0.94  CALCIUM 9.7 9.2  PROT 6.9  --   ALBUMIN 4.5  --   AST 26  --   ALT 19  --    ALKPHOS 68  --   BILITOT 0.4  --   GFRNONAA >60 >60  ANIONGAP 12 9     Hematology Recent Labs  Lab 09/26/20 0550 09/27/20 0501 09/28/20 0256  WBC 4.6 4.7 5.3  RBC 4.14 3.75* 4.04  HGB 13.3 11.9* 12.5  HCT 39.7 35.7* 39.5  MCV 95.9 95.2 97.8  MCH 32.1 31.7 30.9  MCHC 33.5 33.3 31.6  RDW 12.1 12.6 12.4  PLT 211 214 218    BNP Recent Labs  Lab 09/26/20 1633  BNP 608.1*     DDimer  Recent Labs  Lab 09/26/20 0545  DDIMER <0.27     Radiology    ECHOCARDIOGRAM COMPLETE  Result Date: 09/26/2020    ECHOCARDIOGRAM REPORT   Patient Name:   Janet Ramirez  Date of Exam: 09/26/2020 Medical Rec #:  [id number]  Height:       64.0 in Accession #:    [id number] Weight:       129.2 lb Date of Birth:  07/15/1961   BSA:          1.625 m Patient Age:  59 years   BP:           116/77 mmHg Patient Gender: F          HR:           85 bpm. Exam Location:  Inpatient Procedure: 2D Echo Indications:    NSTEMI  History:        Patient has no prior history of Echocardiogram examinations.  Sonographer:    Delcie Roch Referring Phys: [id number] [place] THOMAS O'NEAL IMPRESSIONS  1. Frequent PVCs. EF severely reduced with global HK ~25-30%.  2. Left ventricular ejection fraction, by estimation, is 25 to 30%. The left ventricle has severely decreased function. The left ventricle demonstrates global hypokinesis. The left ventricular internal cavity size was moderately to severely dilated. Left ventricular diastolic parameters are indeterminate.  3. Right ventricular systolic function is normal. The right ventricular size is normal. There is normal pulmonary artery systolic pressure. The estimated right ventricular systolic pressure is 28.6 mmHg.  4. Left atrial size was mildly dilated.  5. The mitral valve is grossly normal. Mild to moderate mitral valve regurgitation. No evidence of mitral stenosis.  6. The aortic valve is tricuspid. Aortic valve regurgitation is mild. No aortic stenosis is present.  7. The  inferior vena cava is normal in size with <50% respiratory variability, suggesting right atrial pressure of 8 mmHg. FINDINGS  Left Ventricle: Left ventricular ejection fraction, by estimation, is 25 to 30%. The left ventricle has severely decreased function. The left ventricle demonstrates global hypokinesis. The left ventricular internal cavity size was moderately to severely  dilated. There is no left ventricular hypertrophy. Left ventricular diastolic parameters are indeterminate. Right Ventricle: The right ventricular size is normal. No increase in right ventricular wall thickness. Right ventricular systolic function is normal. There is normal pulmonary artery systolic pressure. The tricuspid regurgitant velocity is 2.27 m/s, and  with an assumed right atrial pressure of 8 mmHg, the estimated right ventricular systolic pressure is 28.6 mmHg. Left Atrium: Left atrial size was mildly dilated. Right Atrium: Right atrial size was normal in size. Pericardium: Trivial pericardial effusion is present. Mitral Valve: The mitral valve is grossly normal. Mild to moderate mitral valve regurgitation. No evidence of mitral valve stenosis. Tricuspid Valve: The tricuspid valve is grossly normal. Tricuspid valve regurgitation is mild . No evidence of tricuspid stenosis. Aortic Valve: The aortic valve is tricuspid. Aortic valve regurgitation is mild. No aortic stenosis is present. Pulmonic Valve: The pulmonic valve was grossly normal. Pulmonic valve regurgitation is trivial. No evidence of pulmonic stenosis. Aorta: The aortic root and ascending aorta are structurally normal, with no evidence of dilitation. Venous: The inferior vena cava is normal in size with less than 50% respiratory variability, suggesting right atrial pressure of 8 mmHg. IAS/Shunts: The atrial septum is grossly normal.  LEFT VENTRICLE PLAX 2D LVIDd:         6.50 cm LVIDs:         5.40 cm LV PW:         0.70 cm LV IVS:        0.80 cm LVOT diam:     2.00 cm LVOT  Area:     3.14 cm  LV Volumes (MOD) LV vol d, MOD A4C: 113.0 ml LV vol s, MOD A4C: 86.3 ml LV SV MOD A4C:     113.0 ml IVC IVC diam: 1.70 cm LEFT ATRIUM             Index  RIGHT ATRIUM           Index LA diam:        4.00 cm 2.46 cm/m  RA Area:     12.00 cm LA Vol (A2C):   81.5 ml 50.16 ml/m RA Volume:   26.50 ml  16.31 ml/m LA Vol (A4C):   62.3 ml 38.34 ml/m LA Biplane Vol: 73.4 ml 45.17 ml/m   AORTA Ao Root diam: 3.00 cm Ao Asc diam:  3.60 cm MITRAL VALVE               TRICUSPID VALVE MV Area (PHT): 4.60 cm    TR Peak grad:   20.6 mmHg MV Decel Time: 165 msec    TR Vmax:        227.00 cm/s MV E velocity: 56.10 cm/s MV A velocity: 57.00 cm/s  SHUNTS MV E/A ratio:  0.98        Systemic Diam: 2.00 cm Lennie Odor MD Electronically signed by Lennie Odor MD Signature Date/Time: 09/26/2020/4:03:33 PM    Final     Patient Profile     60 y.o. female admitted with chest pain and palpitations.  Troponin elevated suggestive of possible non-ST elevation myocardial infarction.  Echocardiogram shows ejection fraction 25 to 30%, moderate to severe left ventricular enlargement, mild left atrial enlargement, mild to moderate mitral regurgitation, mild aortic insufficiency.  Electrocardiogram shows sinus rhythm with PVCs and left ventricular hypertrophy.  Assessment & Plan    1 possible non-ST elevation myocardial infarction-patient admitted with chest pain followed by palpitations.  Troponin mildly elevated.  Question obstructive coronary disease versus elevated troponin related to arrhythmia.  Plan cardiac catheterization today.  The risk and benefits including myocardial infarction, CVA and death discussed and she agrees to proceed.  Continue aspirin, heparin, statin and low-dose beta-blocker.  2 cardiomyopathy-etiology unclear.  Plan right and left cardiac catheterization to exclude coronary disease.  She is also noted to have frequent ectopy on telemetry which could be contributing to cardiomyopathy.   Continue carvedilol (increase to 6.25 BID) and advance as tolerated.  Will need outpatient monitor and if ectopy significant may need to consider amiodarone.  If cardiac catheterization does not reveal coronary disease will need cardiac MRI to exclude infiltrative cardiomyopathy particularly with LVH noted on electrocardiogram.  Continue Entresto and carvedilol and titrate as tolerated.  Discontinue isosorbide.  Add spironolactone following cardiac catheterization.  Note TSH is normal.  3 palpitations-patient had sudden onset of palpitations at time of admission.  Will need outpatient monitor to further assess.  4 hyperlipidemia-continue statin.  For questions or updates, please contact [facility] Please consult [email or web address] for contact info under        Signed, Olga Millers, MD  09/28/2020, 7:58 AM

## 2020-09-28 NOTE — H&P (View-Only) (Signed)
Progress Note  Patient Name: Janet Ramirez Date of Encounter: 09/28/2020  [facility] Cardiologist: Little Ishikawa, MD   Subjective   No CP or dyspnea  Inpatient Medications    Scheduled Meds: . aspirin EC  81 mg Oral Daily  . atorvastatin  80 mg Oral Daily  . carvedilol  3.125 mg Oral BID WC  . isosorbide mononitrate  15 mg Oral Daily  . sacubitril-valsartan  1 tablet Oral BID  . sodium chloride flush  3 mL Intravenous Q12H   Continuous Infusions: . sodium chloride    . sodium chloride 50 mL/hr at 09/28/20 0445  . heparin 850 Units/hr (09/28/20 0448)   PRN Meds: sodium chloride, acetaminophen, nitroGLYCERIN, ondansetron (ZOFRAN) IV, sodium chloride flush   Vital Signs    Vitals:   09/27/20 0545 09/27/20 2000 09/28/20 0439 09/28/20 0500  BP: (!) 115/59 110/69 (!) 120/58   Pulse: 60 69 (!) 57   Resp:  18 18   Temp: 98.2 F (36.8 C) 97.7 F (36.5 C) 97.6 F (36.4 C)   TempSrc: Oral Oral Oral   SpO2: 97% 98% 99%   Weight:    57.2 kg  Height:       No intake or output data in the 24 hours ending 09/28/20 0758 Last 3 Weights 09/28/2020 09/26/2020 09/26/2020  Weight (lbs) 126 lb 129 lb 3.2 oz 131 lb 12.8 oz  Weight (kg) 57.153 kg 58.605 kg 59.784 kg      Telemetry    Sinus with PVCs and PAT - Personally Reviewed   Physical Exam   GEN: No acute distress.   Neck: No JVD Cardiac: RRR, no murmurs, rubs, or gallops.  Respiratory: Clear to auscultation bilaterally. GI: Soft, nontender, non-distended  MS: No edema Neuro:  Nonfocal  Psych: Normal affect   Labs    High Sensitivity Troponin:   Recent Labs  Lab 09/26/20 0550 09/26/20 0832 09/26/20 1011 09/26/20 1356  TROPONINIHS 57* 180* 187* 158*      Chemistry Recent Labs  Lab 09/26/20 0545 09/27/20 0501  NA 142 142  K 3.5 3.9  CL 103 106  CO2 27 27  GLUCOSE 115* 101*  BUN 13 8  CREATININE 0.97 0.94  CALCIUM 9.7 9.2  PROT 6.9  --   ALBUMIN 4.5  --   AST 26  --   ALT 19  --    ALKPHOS 68  --   BILITOT 0.4  --   GFRNONAA >60 >60  ANIONGAP 12 9     Hematology Recent Labs  Lab 09/26/20 0550 09/27/20 0501 09/28/20 0256  WBC 4.6 4.7 5.3  RBC 4.14 3.75* 4.04  HGB 13.3 11.9* 12.5  HCT 39.7 35.7* 39.5  MCV 95.9 95.2 97.8  MCH 32.1 31.7 30.9  MCHC 33.5 33.3 31.6  RDW 12.1 12.6 12.4  PLT 211 214 218    BNP Recent Labs  Lab 09/26/20 1633  BNP 608.1*     DDimer  Recent Labs  Lab 09/26/20 0545  DDIMER <0.27     Radiology    ECHOCARDIOGRAM COMPLETE  Result Date: 09/26/2020    ECHOCARDIOGRAM REPORT   Patient Name:   ALYZAH PELLY  Date of Exam: 09/26/2020 Medical Rec #:  [id number]  Height:       64.0 in Accession #:    [id number] Weight:       129.2 lb Date of Birth:  07/15/1961   BSA:          1.625 m Patient Age:  59 years   BP:           116/77 mmHg Patient Gender: F          HR:           85 bpm. Exam Location:  Inpatient Procedure: 2D Echo Indications:    NSTEMI  History:        Patient has no prior history of Echocardiogram examinations.  Sonographer:    Delcie Roch Referring Phys: [id number] [place] THOMAS O'NEAL IMPRESSIONS  1. Frequent PVCs. EF severely reduced with global HK ~25-30%.  2. Left ventricular ejection fraction, by estimation, is 25 to 30%. The left ventricle has severely decreased function. The left ventricle demonstrates global hypokinesis. The left ventricular internal cavity size was moderately to severely dilated. Left ventricular diastolic parameters are indeterminate.  3. Right ventricular systolic function is normal. The right ventricular size is normal. There is normal pulmonary artery systolic pressure. The estimated right ventricular systolic pressure is 28.6 mmHg.  4. Left atrial size was mildly dilated.  5. The mitral valve is grossly normal. Mild to moderate mitral valve regurgitation. No evidence of mitral stenosis.  6. The aortic valve is tricuspid. Aortic valve regurgitation is mild. No aortic stenosis is present.  7. The  inferior vena cava is normal in size with <50% respiratory variability, suggesting right atrial pressure of 8 mmHg. FINDINGS  Left Ventricle: Left ventricular ejection fraction, by estimation, is 25 to 30%. The left ventricle has severely decreased function. The left ventricle demonstrates global hypokinesis. The left ventricular internal cavity size was moderately to severely  dilated. There is no left ventricular hypertrophy. Left ventricular diastolic parameters are indeterminate. Right Ventricle: The right ventricular size is normal. No increase in right ventricular wall thickness. Right ventricular systolic function is normal. There is normal pulmonary artery systolic pressure. The tricuspid regurgitant velocity is 2.27 m/s, and  with an assumed right atrial pressure of 8 mmHg, the estimated right ventricular systolic pressure is 28.6 mmHg. Left Atrium: Left atrial size was mildly dilated. Right Atrium: Right atrial size was normal in size. Pericardium: Trivial pericardial effusion is present. Mitral Valve: The mitral valve is grossly normal. Mild to moderate mitral valve regurgitation. No evidence of mitral valve stenosis. Tricuspid Valve: The tricuspid valve is grossly normal. Tricuspid valve regurgitation is mild . No evidence of tricuspid stenosis. Aortic Valve: The aortic valve is tricuspid. Aortic valve regurgitation is mild. No aortic stenosis is present. Pulmonic Valve: The pulmonic valve was grossly normal. Pulmonic valve regurgitation is trivial. No evidence of pulmonic stenosis. Aorta: The aortic root and ascending aorta are structurally normal, with no evidence of dilitation. Venous: The inferior vena cava is normal in size with less than 50% respiratory variability, suggesting right atrial pressure of 8 mmHg. IAS/Shunts: The atrial septum is grossly normal.  LEFT VENTRICLE PLAX 2D LVIDd:         6.50 cm LVIDs:         5.40 cm LV PW:         0.70 cm LV IVS:        0.80 cm LVOT diam:     2.00 cm LVOT  Area:     3.14 cm  LV Volumes (MOD) LV vol d, MOD A4C: 113.0 ml LV vol s, MOD A4C: 86.3 ml LV SV MOD A4C:     113.0 ml IVC IVC diam: 1.70 cm LEFT ATRIUM             Index  RIGHT ATRIUM           Index LA diam:        4.00 cm 2.46 cm/m  RA Area:     12.00 cm LA Vol (A2C):   81.5 ml 50.16 ml/m RA Volume:   26.50 ml  16.31 ml/m LA Vol (A4C):   62.3 ml 38.34 ml/m LA Biplane Vol: 73.4 ml 45.17 ml/m   AORTA Ao Root diam: 3.00 cm Ao Asc diam:  3.60 cm MITRAL VALVE               TRICUSPID VALVE MV Area (PHT): 4.60 cm    TR Peak grad:   20.6 mmHg MV Decel Time: 165 msec    TR Vmax:        227.00 cm/s MV E velocity: 56.10 cm/s MV A velocity: 57.00 cm/s  SHUNTS MV E/A ratio:  0.98        Systemic Diam: 2.00 cm Lennie Odor MD Electronically signed by Lennie Odor MD Signature Date/Time: 09/26/2020/4:03:33 PM    Final     Patient Profile     60 y.o. female admitted with chest pain and palpitations.  Troponin elevated suggestive of possible non-ST elevation myocardial infarction.  Echocardiogram shows ejection fraction 25 to 30%, moderate to severe left ventricular enlargement, mild left atrial enlargement, mild to moderate mitral regurgitation, mild aortic insufficiency.  Electrocardiogram shows sinus rhythm with PVCs and left ventricular hypertrophy.  Assessment & Plan    1 possible non-ST elevation myocardial infarction-patient admitted with chest pain followed by palpitations.  Troponin mildly elevated.  Question obstructive coronary disease versus elevated troponin related to arrhythmia.  Plan cardiac catheterization today.  The risk and benefits including myocardial infarction, CVA and death discussed and she agrees to proceed.  Continue aspirin, heparin, statin and low-dose beta-blocker.  2 cardiomyopathy-etiology unclear.  Plan right and left cardiac catheterization to exclude coronary disease.  She is also noted to have frequent ectopy on telemetry which could be contributing to cardiomyopathy.   Continue carvedilol (increase to 6.25 BID) and advance as tolerated.  Will need outpatient monitor and if ectopy significant may need to consider amiodarone.  If cardiac catheterization does not reveal coronary disease will need cardiac MRI to exclude infiltrative cardiomyopathy particularly with LVH noted on electrocardiogram.  Continue Entresto and carvedilol and titrate as tolerated.  Discontinue isosorbide.  Add spironolactone following cardiac catheterization.  Note TSH is normal.  3 palpitations-patient had sudden onset of palpitations at time of admission.  Will need outpatient monitor to further assess.  4 hyperlipidemia-continue statin.  For questions or updates, please contact [facility] Please consult [email or web address] for contact info under        Signed, Olga Millers, MD  09/28/2020, 7:58 AM

## 2020-09-28 NOTE — Progress Notes (Signed)
Pt states she is having CP 4/10-same that she was having prior to adm. Also states when she got up she was slightly dizzy (BP has been soft post cath) and that vision has been "weird" for the las little while, but she had not reported it. States that occasionally this happens prior to migraine. I made Judy Pimple PA aware. Tylenol given for CP and cont to monitor pt for any changes. When I returned to room with tylenol she stated now her head was beginning to hurt. Emelda Brothers RN

## 2020-09-28 NOTE — Progress Notes (Signed)
ANTICOAGULATION CONSULT NOTE - Follow Up Consult  Pharmacy Consult for heparin Indication: NSTEMI   Labs: Recent Labs    09/26/20 0545 09/26/20 0550 09/26/20 0550 09/26/20 0832 09/26/20 1011 09/26/20 1356 09/26/20 1633 09/27/20 0501 09/28/20 0256  HGB  --  13.3   < >  --   --   --   --  11.9* 12.5  HCT  --  39.7  --   --   --   --   --  35.7* 39.5  PLT  --  211  --   --   --   --   --  214 218  HEPARINUNFRC  --   --   --   --   --   --  0.32 0.31 0.28*  CREATININE 0.97  --   --   --   --   --   --  0.94  --   TROPONINIHS  --  57*   < > 180* 187* 158*  --   --   --    < > = values in this interval not displayed.    Assessment: 59yo female subtherapeutic on heparin after two levels at low end of goal; no gtt issues or signs of bleeding per RN.  Goal of Therapy:  Heparin level 0.3-0.7 units/ml   Plan:  Will increase heparin gtt by 2 units/kg/hr to 850 units/hr and check level in 6-8 hours.    Vernard Gambles, PharmD, BCPS  09/28/2020,4:32 AM

## 2020-09-29 ENCOUNTER — Other Ambulatory Visit: Payer: Self-pay | Admitting: Physician Assistant

## 2020-09-29 ENCOUNTER — Encounter: Payer: Self-pay | Admitting: *Deleted

## 2020-09-29 DIAGNOSIS — E785 Hyperlipidemia, unspecified: Secondary | ICD-10-CM

## 2020-09-29 DIAGNOSIS — I493 Ventricular premature depolarization: Secondary | ICD-10-CM | POA: Insufficient documentation

## 2020-09-29 DIAGNOSIS — I5021 Acute systolic (congestive) heart failure: Secondary | ICD-10-CM | POA: Diagnosis not present

## 2020-09-29 DIAGNOSIS — I428 Other cardiomyopathies: Secondary | ICD-10-CM

## 2020-09-29 DIAGNOSIS — R002 Palpitations: Secondary | ICD-10-CM

## 2020-09-29 LAB — POCT I-STAT 7, (LYTES, BLD GAS, ICA,H+H)
Acid-Base Excess: 0 mmol/L (ref 0.0–2.0)
Bicarbonate: 25.8 mmol/L (ref 20.0–28.0)
Calcium, Ion: 1.28 mmol/L (ref 1.15–1.40)
HCT: 38 % (ref 36.0–46.0)
Hemoglobin: 12.9 g/dL (ref 12.0–15.0)
O2 Saturation: 99 %
Potassium: 3.5 mmol/L (ref 3.5–5.1)
Sodium: 144 mmol/L (ref 135–145)
TCO2: 27 mmol/L (ref 22–32)
pCO2 arterial: 45.8 mmHg (ref 32.0–48.0)
pH, Arterial: 7.358 (ref 7.350–7.450)
pO2, Arterial: 154 mmHg — ABNORMAL HIGH (ref 83.0–108.0)

## 2020-09-29 LAB — CBC
HCT: 37.4 % (ref 36.0–46.0)
Hemoglobin: 13 g/dL (ref 12.0–15.0)
MCH: 32.3 pg (ref 26.0–34.0)
MCHC: 34.8 g/dL (ref 30.0–36.0)
MCV: 93 fL (ref 80.0–100.0)
Platelets: 201 10*3/uL (ref 150–400)
RBC: 4.02 MIL/uL (ref 3.87–5.11)
RDW: 12.4 % (ref 11.5–15.5)
WBC: 6.1 10*3/uL (ref 4.0–10.5)
nRBC: 0 % (ref 0.0–0.2)

## 2020-09-29 LAB — POCT I-STAT EG7
Acid-Base Excess: 1 mmol/L (ref 0.0–2.0)
Bicarbonate: 27.1 mmol/L (ref 20.0–28.0)
Calcium, Ion: 1.25 mmol/L (ref 1.15–1.40)
HCT: 38 % (ref 36.0–46.0)
Hemoglobin: 12.9 g/dL (ref 12.0–15.0)
O2 Saturation: 77 %
Potassium: 3.6 mmol/L (ref 3.5–5.1)
Sodium: 146 mmol/L — ABNORMAL HIGH (ref 135–145)
TCO2: 29 mmol/L (ref 22–32)
pCO2, Ven: 47.7 mmHg (ref 44.0–60.0)
pH, Ven: 7.363 (ref 7.250–7.430)
pO2, Ven: 43 mmHg (ref 32.0–45.0)

## 2020-09-29 LAB — BASIC METABOLIC PANEL
Anion gap: 9 (ref 5–15)
BUN: 9 mg/dL (ref 6–20)
CO2: 23 mmol/L (ref 22–32)
Calcium: 9.1 mg/dL (ref 8.9–10.3)
Chloride: 109 mmol/L (ref 98–111)
Creatinine, Ser: 0.79 mg/dL (ref 0.44–1.00)
GFR, Estimated: >60 mL/min (ref >60)
Glucose, Bld: 97 mg/dL (ref 70–99)
Potassium: 3.6 mmol/L (ref 3.5–5.1)
Sodium: 141 mmol/L (ref 135–145)

## 2020-09-29 LAB — GLUCOSE, CAPILLARY: Glucose-Capillary: 98 mg/dL (ref 70–99)

## 2020-09-29 MED ORDER — SPIRONOLACTONE 25 MG PO TABS: 12.5000 mg | ORAL_TABLET | Freq: Every day | ORAL | 6 refills | Status: DC

## 2020-09-29 MED ORDER — SPIRONOLACTONE 12.5 MG HALF TABLET
12.5000 mg | ORAL_TABLET | Freq: Every day | ORAL | Status: DC
  Administered 2020-09-29: 12.5 mg via ORAL
  Filled 2020-09-29: qty 1

## 2020-09-29 MED ORDER — CARVEDILOL 6.25 MG PO TABS: 6.2500 mg | ORAL_TABLET | Freq: Two times a day (BID) | ORAL | 6 refills | Status: DC

## 2020-09-29 MED ORDER — SACUBITRIL-VALSARTAN 24-26 MG PO TABS: 1.0000 | ORAL_TABLET | Freq: Two times a day (BID) | ORAL | 6 refills | Status: DC

## 2020-09-29 MED FILL — SPIRONOLACTONE 25 MG TABLET: 25 | 30 days supply | Qty: 15 | Fill #0

## 2020-09-29 MED FILL — ENTRESTO 24 MG-26 MG TABLET: 24-26 | 30 days supply | Qty: 60 | Fill #0

## 2020-09-29 MED FILL — CARVEDILOL 6.25 MG TABLET: 6.25 | 30 days supply | Qty: 60 | Fill #0

## 2020-09-29 NOTE — Progress Notes (Signed)
Patient ID: Janet Ramirez, female   DOB: 1961-06-28, 59 y.o.   MRN: [id number] Patient enrolled for [employer] to ship a 30 day Cardiac Event Monitor to her home.  Letter with instructions mailed to patient.

## 2020-09-29 NOTE — TOC Benefit Eligibility Note (Signed)
Transition of Care [facility]) Benefit Eligibility Note    Patient Details  Name: Chevy Virgo MRN: [id number] Date of Birth: 08-07-1961   Medication/Dose: Sacubitril=Balstrtan 24-26mg  bid, Sherryll Burger 24-26 bid 30 day supply  Covered?: Yes  Tier: 2 Drug  Prescription Coverage [facility]erred [facility]  Spoke with Person/Company/Phone Number:: Keisha C. W[employer] . PH# [phone]  Co-Pay: $10.00 for Generic and $ 35.00 for Entresto  Prior Approval: No  Deductible:  (no-deductible)       Renie Ora Phone Number: 09/29/2020, 11:24 AM

## 2020-09-29 NOTE — Discharge Instructions (Signed)
   1. Weigh yourself EVERY morning after you go to the bathroom but before you eat or drink anything. Write this number down in a weight log/diary. If you gain 3 pounds overnight or 5 pounds in a week, call the office. 2. Take your medicines as prescribed. If you have concerns about your medications, please call us before you stop taking them.  3. Eat low salt foods--Limit salt (sodium) to 2000 mg per day. This will help prevent your body from holding onto fluid. Read food labels as many processed foods have a lot of sodium, especially canned goods and prepackaged meats. If you would like some assistance choosing low sodium foods, we would be happy to set you up with a nutritionist. 4. Stay as active as you can everyday. Staying active will give you more energy and make your muscles stronger. Start with 5 minutes at a time and work your way up to 30 minutes a day. Break up your activities--do some in the morning and some in the afternoon. Start with 3 days per week and work your way up to 5 days as you can.  If you have chest pain, feel short of breath, dizzy, or lightheaded, STOP. If you don't feel better after a short rest, call 911. If you do feel better, call the office to let us know you have symptoms with exercise. 5. Limit all fluids for the day to less than 2 liters. Fluid includes all drinks, coffee, juice, ice chips, soup, jello, and all other liquids.  

## 2020-09-29 NOTE — Discharge Summary (Signed)
Discharge Summary    Patient ID: Janet Ramirez MRN: [id number]; DOB: 16-Sep-1961  Admit date: 09/26/2020 Discharge date: 09/29/2020  Primary Care Provider: Mitzi Hansen, NP  Primary Cardiologist: Little Ishikawa, MD   Discharge Diagnoses    Principal Problem:   NSTEMI (non-ST elevated myocardial infarction) [facility]) Active Problems:   Chest pain   Elevated troponin   Acute systolic CHF (congestive heart failure) (HCC)   Palpitations   Non-ischemic cardiomyopathy (HCC)   Hyperlipemia   Diagnostic Studies/Procedures    Echo 09/26/2020 1. Frequent PVCs. EF severely reduced with global HK ~25-30%.  2. Left ventricular ejection fraction, by estimation, is 25 to 30%. The  left ventricle has severely decreased function. The left ventricle  demonstrates global hypokinesis. The left ventricular internal cavity size  was moderately to severely dilated.  Left ventricular diastolic parameters are indeterminate.  3. Right ventricular systolic function is normal. The right ventricular  size is normal. There is normal pulmonary artery systolic pressure. The  estimated right ventricular systolic pressure is 28.6 mmHg.  4. Left atrial size was mildly dilated.  5. The mitral valve is grossly normal. Mild to moderate mitral valve  regurgitation. No evidence of mitral stenosis.  6. The aortic valve is tricuspid. Aortic valve regurgitation is mild. No  aortic stenosis is present.  7. The inferior vena cava is normal in size with <50% respiratory  variability, suggesting right atrial pressure of 8 mmHg.   RIGHT/LEFT HEART CATH AND CORONARY ANGIOGRAPHY  09/28/2020    Conclusion    There is severe left ventricular systolic dysfunction.  LV end diastolic pressure is normal.  The left ventricular ejection fraction is 25-35% by visual estimate.   1. Normal coronary anatomy 2. Severe LV dysfunction. EF 25-30% with global HK 3. Low/normal LV filling pressures 4. Normal right  heart pressures 5. Preserved cardiac output with index 3.56.  Plan: medical management.    History of Present Illness     Janet Ramirez is a 59 y.o. female with HLD and hypothyroidism presented with chest pain and tachypalpitation and found to have elevated troponin.  She was recently seen by Dr. Bjorn Pippin on 09/11/2020 for abnormal EKG. On her EKG, she has very frequent PVCs. She has exertional dyspnea and occasional chest discomfort that occurs at rest however not with exertion. Dr. Bjorn Pippin ordered a coronary scoring test and also a Myoview. She was deemed a poor candidate for coronary CT due to frequent PVCs. A 3-day Zio monitor was also ordered to quantify PVC burden.  She was having more frequent episode of chest discomfort for past one week. The location of pain is located in the left shoulder blade radiating to the front of the chest. She described it as a pressure-like sensation. Interestingly, she has been able to walk her dog frequently without any exertional type of chest discomfort. She woke up around 3 AM with the same chest discomfort, shortly after, she started having a pounding sensation in her chest and feeling of tachycardia palpitation. Symptom lasted a total 15 to 20 minutes. This prompted her to seek medical attention at [facility]. Initial EKG showed sinus rhythm, nonspecific T wave changes, frequent PVCs. Serial troponin 57-->180-->187. D-dimer negative. Viral panel negative.   Hospital Course     Consultants: None  1. NSTEMI - Hs-troponin peaked at 180. Treated with IV heparin. Echo with low LVEF. Cath showed normal coronaries. Could be demand from palpitations.   2. Nonischemic cardiomyopathy/ Acute systolic CHF - Normal coronary anatomy by  cath - Echo shows ejection fraction 25 to 30%, moderate to severe left ventricular enlargement, mild left atrial enlargement, mild to moderate mitral regurgitation, mild aortic insufficiency. There is LVH and PVCs during  echo.  - ? PVC mediated cardiomyopathy - Plan outpatient cardiac MRI to rule out infiltrative cardiomyopathy. - Not on strict I & O - Weight 131 at admit to 125 at discharge - Normal filling pressure during cath - Continue Entresto, Coreg and spironolactone.  - BMET at follow up on 10/07/20 with Dr. Bjorn Pippin - Outpatient Advanced Heart Failure referral  - Heart failure education written   3. Palpitations/PVCs - Had sudden onset palpitations at time of admission - She has pending 3 days monitor result but no symptoms at that time - Arrange 30 days event monitor  4 Hyperlipidemia - No coronary artery disease documented at time of catheterization - Discontinued Lipitor - Continue diet.  Did the patient have an acute coronary syndrome (MI, NSTEMI, STEMI, etc) this admission?:  No.   The elevated Troponin was due to the acute medical illness (demand ischemia).   Discharge Vitals Blood pressure (!) 105/58, pulse 60, temperature 97.8 F (36.6 C), temperature source Oral, resp. rate 18, height 5\' 4"  (1.626 m), weight 56.9 kg, SpO2 96 %.  Filed Weights   09/26/20 1215 09/28/20 0500 09/29/20 0701  Weight: 58.6 kg 57.2 kg 56.9 kg    Labs & Radiologic Studies    CBC Recent Labs    09/28/20 0256 09/28/20 1119 09/29/20 0253  WBC 5.3  --  6.1  HGB 12.5 12.9 13.0  HCT 39.5 38.0 37.4  MCV 97.8  --  93.0  PLT 218  --  201   Basic Metabolic Panel Recent Labs    10/01/20 0501 09/28/20 1119 09/29/20 0253  NA 142 144 141  K 3.9 3.5 3.6  CL 106  --  109  CO2 27  --  23  GLUCOSE 101*  --  97  BUN 8  --  9  CREATININE 0.94  --  0.79  CALCIUM 9.2  --  9.1   High Sensitivity Troponin:   Recent Labs  Lab 09/26/20 0550 09/26/20 0832 09/26/20 1011 09/26/20 1356  TROPONINIHS 57* 180* 187* 158*    Hemoglobin A1C Recent Labs    09/27/20 0501  HGBA1C 5.1   Fasting Lipid Panel Recent Labs    09/27/20 0501  CHOL 189  HDL 76  LDLCALC 104*  TRIG 45  CHOLHDL 2.5   Thyroid  Function Tests Recent Labs    09/26/20 1356  TSH 2.261   _____________  DG Chest 2 View  Result Date: 09/26/2020 CLINICAL DATA:  Chest pain EXAM: CHEST - 2 VIEW COMPARISON:  None. FINDINGS: Borderline cardiomegaly. Negative aortic and hilar contours. There is no edema, consolidation, effusion, or pneumothorax. IMPRESSION: No evidence of active disease. Electronically Signed   By: 09/28/2020 M.D.   On: 09/26/2020 05:55   CARDIAC CATHETERIZATION  Result Date: 09/28/2020  There is severe left ventricular systolic dysfunction.  LV end diastolic pressure is normal.  The left ventricular ejection fraction is 25-35% by visual estimate.  1. Normal coronary anatomy 2. Severe LV dysfunction. EF 25-30% with global HK 3. Low/normal LV filling pressures 4. Normal right heart pressures 5. Preserved cardiac output with index 3.56. Plan: medical management.   ECHOCARDIOGRAM COMPLETE  Result Date: 09/26/2020    ECHOCARDIOGRAM REPORT   Patient Name:   Janet Ramirez  Date of Exam: 09/26/2020 Medical Rec #:  09/28/2020  Height:       64.0 in Accession #:    [id number] Weight:       129.2 lb Date of Birth:  01-23-1961   BSA:          1.625 m Patient Age:    59 years   BP:           116/77 mmHg Patient Gender: F          HR:           85 bpm. Exam Location:  Inpatient Procedure: 2D Echo Indications:    NSTEMI  History:        Patient has no prior history of Echocardiogram examinations.  Sonographer:    Delcie Roch Referring Phys: [id number] [place] THOMAS O'NEAL IMPRESSIONS  1. Frequent PVCs. EF severely reduced with global HK ~25-30%.  2. Left ventricular ejection fraction, by estimation, is 25 to 30%. The left ventricle has severely decreased function. The left ventricle demonstrates global hypokinesis. The left ventricular internal cavity size was moderately to severely dilated. Left ventricular diastolic parameters are indeterminate.  3. Right ventricular systolic function is normal. The right ventricular size  is normal. There is normal pulmonary artery systolic pressure. The estimated right ventricular systolic pressure is 28.6 mmHg.  4. Left atrial size was mildly dilated.  5. The mitral valve is grossly normal. Mild to moderate mitral valve regurgitation. No evidence of mitral stenosis.  6. The aortic valve is tricuspid. Aortic valve regurgitation is mild. No aortic stenosis is present.  7. The inferior vena cava is normal in size with <50% respiratory variability, suggesting right atrial pressure of 8 mmHg. FINDINGS  Left Ventricle: Left ventricular ejection fraction, by estimation, is 25 to 30%. The left ventricle has severely decreased function. The left ventricle demonstrates global hypokinesis. The left ventricular internal cavity size was moderately to severely  dilated. There is no left ventricular hypertrophy. Left ventricular diastolic parameters are indeterminate. Right Ventricle: The right ventricular size is normal. No increase in right ventricular wall thickness. Right ventricular systolic function is normal. There is normal pulmonary artery systolic pressure. The tricuspid regurgitant velocity is 2.27 m/s, and  with an assumed right atrial pressure of 8 mmHg, the estimated right ventricular systolic pressure is 28.6 mmHg. Left Atrium: Left atrial size was mildly dilated. Right Atrium: Right atrial size was normal in size. Pericardium: Trivial pericardial effusion is present. Mitral Valve: The mitral valve is grossly normal. Mild to moderate mitral valve regurgitation. No evidence of mitral valve stenosis. Tricuspid Valve: The tricuspid valve is grossly normal. Tricuspid valve regurgitation is mild . No evidence of tricuspid stenosis. Aortic Valve: The aortic valve is tricuspid. Aortic valve regurgitation is mild. No aortic stenosis is present. Pulmonic Valve: The pulmonic valve was grossly normal. Pulmonic valve regurgitation is trivial. No evidence of pulmonic stenosis. Aorta: The aortic root and  ascending aorta are structurally normal, with no evidence of dilitation. Venous: The inferior vena cava is normal in size with less than 50% respiratory variability, suggesting right atrial pressure of 8 mmHg. IAS/Shunts: The atrial septum is grossly normal.  LEFT VENTRICLE PLAX 2D LVIDd:         6.50 cm LVIDs:         5.40 cm LV PW:         0.70 cm LV IVS:        0.80 cm LVOT diam:     2.00 cm LVOT Area:     3.14 cm  LV Volumes (MOD)  LV vol d, MOD A4C: 113.0 ml LV vol s, MOD A4C: 86.3 ml LV SV MOD A4C:     113.0 ml IVC IVC diam: 1.70 cm LEFT ATRIUM             Index       RIGHT ATRIUM           Index LA diam:        4.00 cm 2.46 cm/m  RA Area:     12.00 cm LA Vol (A2C):   81.5 ml 50.16 ml/m RA Volume:   26.50 ml  16.31 ml/m LA Vol (A4C):   62.3 ml 38.34 ml/m LA Biplane Vol: 73.4 ml 45.17 ml/m   AORTA Ao Root diam: 3.00 cm Ao Asc diam:  3.60 cm MITRAL VALVE               TRICUSPID VALVE MV Area (PHT): 4.60 cm    TR Peak grad:   20.6 mmHg MV Decel Time: 165 msec    TR Vmax:        227.00 cm/s MV E velocity: 56.10 cm/s MV A velocity: 57.00 cm/s  SHUNTS MV E/A ratio:  0.98        Systemic Diam: 2.00 cm Lennie Odor MD Electronically signed by Lennie Odor MD Signature Date/Time: 09/26/2020/4:03:33 PM    Final    Disposition   Pt is being discharged home today in good condition.  Follow-up Plans & Appointments     Follow-up Information    Little Ishikawa, MD. Go on 10/07/2020.   Specialties: Cardiology, Radiology Why: @2 :20pm for hospital follow up Contact information: [address] [employer] [place] [employer] [place] [phone]              Discharge Instructions    Diet - low sodium heart healthy   Complete by: As directed    Increase activity slowly   Complete by: As directed       Discharge Medications   Allergies as of 09/29/2020      Reactions   Codeine Itching   Sulfacetamide Sodium Diarrhea      Medication List    TAKE these medications   alum & mag  hydroxide-simeth 200-200-20 MG/5ML suspension Commonly known as: MAALOX/MYLANTA Take 30 mLs by mouth every 6 (six) hours as needed for indigestion or heartburn.   aspirin 325 MG tablet Take 325 mg by mouth as needed for mild pain.   bismuth subsalicylate 262 MG chewable tablet Commonly known as: PEPTO BISMOL Chew 524 mg by mouth as needed for indigestion.   calcium carbonate 500 MG chewable tablet Commonly known as: TUMS - dosed in mg elemental calcium Chew 1-2 tablets by mouth as needed for indigestion or heartburn.   carvedilol 6.25 MG tablet Commonly known as: COREG Take 1 tablet (6.25 mg total) by mouth 2 (two) times daily with a meal.   cholecalciferol 25 MCG (1000 UNIT) tablet Commonly known as: VITAMIN D3 Take 1,000 Units by mouth daily. 1 Tablet Daily   esomeprazole 40 MG capsule Commonly known as: NEXIUM Take 40 mg by mouth daily as needed (heartburn).   sacubitril-valsartan 24-26 MG Commonly known as: ENTRESTO Take 1 tablet by mouth 2 (two) times daily.   simethicone 125 MG chewable tablet Commonly known as: MYLICON Chew 125 mg by mouth every 6 (six) hours as needed for flatulence.   spironolactone 25 MG tablet Commonly known as: ALDACTONE Take 0.5 tablets (12.5 mg total) by mouth daily.   vitamin C 1000  MG tablet Take 1,000 mg by mouth daily. 1 Tablet Daily   zinc gluconate 50 MG tablet Take 50 mg by mouth daily. 1 Tablet Daily          Outstanding Labs/Studies   BMET at follow up   Duration of Discharge Encounter   Greater than 30 minutes including physician time.  Lorelei Pont, PA 09/29/2020, 8:44 AM

## 2020-09-29 NOTE — Progress Notes (Addendum)
Progress Note  Patient Name: Janet Ramirez Date of Encounter: 09/29/2020  [facility] Cardiologist: Little Ishikawa, MD   Subjective   Pt denies CP or dyspnea  Inpatient Medications    Scheduled Meds: . aspirin EC  81 mg Oral Daily  . atorvastatin  80 mg Oral Daily  . carvedilol  6.25 mg Oral BID WC  . enoxaparin (LOVENOX) injection  40 mg Subcutaneous Q24H  . sacubitril-valsartan  1 tablet Oral BID  . sodium chloride flush  3 mL Intravenous Q12H  . sodium chloride flush  3 mL Intravenous Q12H   Continuous Infusions: . sodium chloride     PRN Meds: sodium chloride, acetaminophen, nitroGLYCERIN, ondansetron (ZOFRAN) IV, sodium chloride flush   Vital Signs    Vitals:   09/28/20 1533 09/28/20 2119 09/29/20 0657 09/29/20 0701  BP: (!) 125/56 (!) 106/57 (!) 117/57   Pulse:      Resp:  18 18   Temp:  98 F (36.7 C) 98.1 F (36.7 C)   TempSrc:  Oral Oral   SpO2: 100% 98% 96%   Weight:    56.9 kg  Height:       No intake or output data in the 24 hours ending 09/29/20 0726 Last 3 Weights 09/29/2020 09/28/2020 09/26/2020  Weight (lbs) 125 lb 6.4 oz 126 lb 129 lb 3.2 oz  Weight (kg) 56.881 kg 57.153 kg 58.605 kg      Telemetry    Sinus with PVCs and brief PAT - Personally Reviewed   Physical Exam   GEN: WD WN NAD   Neck: No JVD, supple Cardiac: RRR Respiratory: CTA GI: Soft, NT/ND MS: No edema, radial cath site with no hematoma Neuro:  Grossly intact Psych: Normal affect   Labs    High Sensitivity Troponin:   Recent Labs  Lab 09/26/20 0550 09/26/20 0832 09/26/20 1011 09/26/20 1356  TROPONINIHS 57* 180* 187* 158*      Chemistry Recent Labs  Lab 09/26/20 0545 09/27/20 0501 09/28/20 1119 09/29/20 0253  NA 142 142 144 141  K 3.5 3.9 3.5 3.6  CL 103 106  --  109  CO2 27 27  --  23  GLUCOSE 115* 101*  --  97  BUN 13 8  --  9  CREATININE 0.97 0.94  --  0.79  CALCIUM 9.7 9.2  --  9.1  PROT 6.9  --   --   --   ALBUMIN 4.5  --   --    --   AST 26  --   --   --   ALT 19  --   --   --   ALKPHOS 68  --   --   --   BILITOT 0.4  --   --   --   GFRNONAA >60 >60  --  >60  ANIONGAP 12 9  --  9     Hematology Recent Labs  Lab 09/27/20 0501 09/28/20 0256 09/28/20 1119 09/29/20 0253  WBC 4.7 5.3  --  6.1  RBC 3.75* 4.04  --  4.02  HGB 11.9* 12.5 12.9 13.0  HCT 35.7* 39.5 38.0 37.4  MCV 95.2 97.8  --  93.0  MCH 31.7 30.9  --  32.3  MCHC 33.3 31.6  --  34.8  RDW 12.6 12.4  --  12.4  PLT 214 218  --  201    BNP Recent Labs  Lab 09/26/20 1633  BNP 608.1*     DDimer  Recent  Labs  Lab 09/26/20 0545  DDIMER <0.27     Radiology    CARDIAC CATHETERIZATION  Result Date: 09/28/2020  There is severe left ventricular systolic dysfunction.  LV end diastolic pressure is normal.  The left ventricular ejection fraction is 25-35% by visual estimate.  1. Normal coronary anatomy 2. Severe LV dysfunction. EF 25-30% with global HK 3. Low/normal LV filling pressures 4. Normal right heart pressures 5. Preserved cardiac output with index 3.56. Plan: medical management.    Patient Profile     59 y.o. female admitted with chest pain and palpitations.  Troponin elevated suggestive of possible non-ST elevation myocardial infarction.  Echocardiogram shows ejection fraction 25 to 30%, moderate to severe left ventricular enlargement, mild left atrial enlargement, mild to moderate mitral regurgitation, mild aortic insufficiency.  Electrocardiogram shows sinus rhythm with PVCs and left ventricular hypertrophy.  Cardiac catheterization revealed normal coronary arteries, severe LV dysfunction and low normal LV filling pressures.  Assessment & Plan    1 cardiomyopathy-etiology of cardiomyopathy unclear.  Cardiac catheterization revealed no coronary disease.  TSH and HIV normal.  No history of alcohol abuse or recent viral symptoms.  She is noted to have frequent ectopy on telemetry.  Question PVC mediated cardiomyopathy.  We will  arrange outpatient 30-day monitor to quantify ectopy.  If significant may need amiodarone.  She is also noted to have left ventricular hypertrophy on her ECG.  We will arrange outpatient cardiac MRI to rule out infiltrative cardiomyopathy.  Continue Entresto, carvedilol and add spironolactone 12.5 mg daily.  Check potassium and renal function 1 week following discharge.  We will arrange outpatient follow-up with CHF clinic for medication titration.  Once medications fully titrated would need to repeat echocardiogram 3 months later.  If ejection fraction less than 35% would need to consider ICD.    2 acute systolic congestive heart failure-symptoms have improved.  LV filling pressure normal at time of cardiac catheterization.  Add spironolactone 12.5 mg daily.  May need to add low-dose Lasix as an outpatient pending symptoms.  Needs fluid restriction and low-sodium diet.  3 palpitations-patient experienced sudden onset of palpitations at time of admission.  Will arrange outpatient 30-day monitor to further assess.  Note she had a recent 3-day monitor but had no symptoms with a monitor in place.  We also discussed an android watch to record rhythm at time of symptoms.    4 hyperlipidemia-no coronary artery disease documented at time of catheterization.  Discontinue Lipitor.  Continue diet.  Discharge today.  Would arrange follow-up with APP 1 to 2 weeks.  Check potassium and renal function in 1 week.  Arrange follow-up in CHF clinic for medication titration.  Follow-up Dr. Jerene Pitch in 3 months.  Arrange outpatient cardiac MRI.  Greater than 30 minutes PA and physician time. D2  For questions or updates, please contact [facility] Please consult [email or web address] for contact info under        Signed, Olga Millers, MD  09/29/2020, 7:26 AM

## 2020-09-29 NOTE — Plan of Care (Signed)

## 2020-09-30 ENCOUNTER — Telehealth: Payer: Self-pay | Admitting: Cardiology

## 2020-09-30 NOTE — Telephone Encounter (Signed)
Pt c/o medication issue:  1. Name of Medication: spironolactone (ALDACTONE) 25 MG tablet  carvedilol (COREG) 6.25 MG tablet    2. How are you currently taking this medication (dosage and times per day)? As directed  3. Are you having a reaction (difficulty breathing--STAT)? No   4. What is your medication issue? Patient states shortly after taking these medications this morning she started to experience tingling in her right hand and blurred vision. She states the blurred vision did not cause much concern because she has frequent migraines and her vision becomes altered. Patient states this episode lasted about 5 minutes and she assumes it may have been a side effect of taking the medications. She states she also had coffee for the first time in 1 week and it may have been the results of her caffeine intake. Please return call to discuss further.

## 2020-09-30 NOTE — Telephone Encounter (Signed)
Called patient, she states that she took her medications this morning for the second time since being home from hospital. Had CATH on 12/27 by Dr.Jordan. Patient states about 2 hours ago she began having tingling in her right hand, and having blurry vision. It only lasted about 5 minutes, as she thought she was having a migraine, but did not have any signs of a headache. She mentions during this she had a small amount of chest discomfort, no shortness of breath. Her BP before meds was 115/70, and after was 95/57 HR in the 50's. She did drink caffeine with the medications this morning but no other diet changes are mentioned. She has not taken the [facility] yet this morning as she takes it later after her other medications.  I advised patient if this happened again to be evaluated, patient verbalized understanding.  Advised I would send a message to Dr.Schumann and his nurse for any further recommendations

## 2020-10-01 NOTE — Telephone Encounter (Signed)
Returned call to patient-she states she took half of her spironolactone this am (6.25 mg) and symptoms improved.  She states she had the tingling sensation 30 mins after taking, lasted about 5 min.   No blurred vision, lightheadedness, dizziness this morning.     BP readings today: 105/60 98/59  106/60  Spoke with Dr. Gean Birchwood to hold spironolactone and keep appt 1/5.  Patient concerned about holding this medication and it effecting fluid retention.  Advised to continue to weigh daily and keep log.  Advised to call if she gains 3 lbs overnight before appt next week.   Also will continue to keep BP log and bring to appointment.   She agrees and verbalized understanding.

## 2020-10-01 NOTE — Telephone Encounter (Signed)
Agree with plan, appears BP is on low side.  Recommend checking BP daily and bring log to upcoming appointment.  If having any lightheadedness would also check BP.

## 2020-10-03 ENCOUNTER — Ambulatory Visit (INDEPENDENT_AMBULATORY_CARE_PROVIDER_SITE_OTHER): Payer: 59

## 2020-10-03 DIAGNOSIS — I493 Ventricular premature depolarization: Secondary | ICD-10-CM

## 2020-10-03 DIAGNOSIS — R002 Palpitations: Secondary | ICD-10-CM | POA: Diagnosis not present

## 2020-10-04 NOTE — Progress Notes (Signed)
Cardiology Office Note:    Date:  10/07/2020   ID:  Janet Ramirez, DOB 1960-10-22, MRN [id number]  PCP:  Mitzi Hansen, NP  Cardiologist:  Little Ishikawa, MD  Electrophysiologist:  None   Referring MD: Mitzi Hansen, NP   Chief Complaint  Patient presents with  . Congestive Heart Failure    History of Present Illness:    Janet Ramirez is a 60 y.o. female with a hx of hypothyroidism, hyperlipidemia who presents for follow-up.  She was referred by Mitzi Hansen, NP for evaluation of EKG abnormalities, initially seen on 09/11/2020.  She reports that she has been having shortness of breath and chest pain.  States that she notes shortness of breath with exertion, particular when walking up hills.  In addition has been having chest pain, that often occurs at night.  Describes as left-sided dull aching pain/pressure.  Radiates to back.  5 out of 10 in intensity.  Last for about 30 minutes.  States that antacid used to help when she was having the pain but have not been helping recently.  Also reports occasional lightheadedness, denies any syncope.  Denies any lower extremity edema.  Reports has been having palpitations for years.  Occurs about once per week, last for few seconds and resolves.  No smoking history. No alcohol use. Family history includes father had PCI in 55s.  Given her chest pain and shortness of breath, at initial clinic visit on 09/11/2020 [facility] and echocardiogram were ordered.  In addition given frequent PVCs on EKG, Zio patch x3 days was ordered.  However on 09/27/2019 she presented to [facility] with chest pain.  Found of elevated troponin to 187.  EKG with nonspecific T wave changes and frequent PVCs.  Echocardiogram on 09/26/2020 showed EF 25 to 30%, severe LV dilatation, normal RV function, mild to moderate MR, mild AI.  Cardiac catheterization on 09/28/2020 showed normal coronary arteries, RA 1, RV 21/0, PA 20 over 7/12, PCWP 4, LVEDP 4, CI 3.6.  She was started on  Entresto, carvedilol, spironolactone.  Reported low BP and lightheadedness/blurred vision at home, spironolactone was held.  Cardiac monitor on 09/29/2020 showed 1 8 beat run of NSVT, and 21 runs of SVT with longest lasting 18 beats, frequent PVCs (6.1%).  Since discharge from hospital, she reports she has been doing okay.  States that weight has been stable, is actually down 2 pounds on her scale since she was discharged from the hospital.  She was having issues with lightheadedness and blurry vision that have improved since stopping spironolactone.  However she has continued to have some low BPs when checked at home, was down to 80s over 50s yesterday.  Continues to have occasional chest pain.  Denies any lightheadedness, syncope, lower extremity edema.  Does report occasional palpitations.   Wt Readings from Last 3 Encounters:  10/07/20 129 lb 6.4 oz (58.7 kg)  09/29/20 125 lb 6.4 oz (56.9 kg)  09/11/20 136 lb (61.7 kg)      Current Medications: Current Meds  Medication Sig  . alum & mag hydroxide-simeth (MAALOX/MYLANTA) 200-200-20 MG/5ML suspension Take 30 mLs by mouth every 6 (six) hours as needed for indigestion or heartburn.  . Ascorbic Acid (VITAMIN C) 1000 MG tablet Take 1,000 mg by mouth daily. 1 Tablet Daily  . aspirin 325 MG tablet Take 325 mg by mouth as needed for mild pain.  . calcium carbonate (TUMS - DOSED IN MG ELEMENTAL CALCIUM) 500 MG chewable tablet Chew 1-2 tablets by mouth as  needed for indigestion or heartburn.  . cholecalciferol (VITAMIN D3) 25 MCG (1000 UNIT) tablet Take 1,000 Units by mouth daily. 1 Tablet Daily  . sacubitril-valsartan (ENTRESTO) 24-26 MG Take 1 tablet by mouth 2 (two) times daily.  . simethicone (MYLICON) 125 MG chewable tablet Chew 125 mg by mouth every 6 (six) hours as needed for flatulence.  . zinc gluconate 50 MG tablet Take 50 mg by mouth daily. 1 Tablet Daily  . [DISCONTINUED] carvedilol (COREG) 6.25 MG tablet Take 1 tablet (6.25 mg total) by  mouth 2 (two) times daily with a meal.     Allergies:   Codeine and Sulfacetamide sodium   Social History   Socioeconomic History  . Marital status: Married    Spouse name: Not on file  . Number of children: Not on file  . Years of education: Not on file  . Highest education level: Not on file  Occupational History  . Not on file  Tobacco Use  . Smoking status: Never Smoker  . Smokeless tobacco: Never Used  Substance and Sexual Activity  . Alcohol use: Yes    Comment: socially  . Drug use: Never  . Sexual activity: Yes    Partners: Male  Other Topics Concern  . Not on file  Social History Narrative  . Not on file   Social Determinants of Health   Financial Resource Strain: Not on file  Food Insecurity: Not on file  Transportation Needs: Not on file  Physical Activity: Not on file  Stress: Not on file  Social Connections: Not on file     Family History: Family history includes father had PCI in 80s.  ROS:   Please see the history of present illness.    All other systems reviewed and are negative.  EKGs/Labs/Other Studies Reviewed:    The following studies were reviewed today:   EKG:  EKG is ordered today.  The ekg ordered today demonstrates sinus rhythm, rate 60, PACs, LVH with repolarization abnormalities  Recent Labs: 09/26/2020: ALT 19; B Natriuretic Peptide 608.1; Magnesium 2.2; TSH 2.261 09/29/2020: BUN 9; Creatinine, Ser 0.79; Hemoglobin 13.0; Platelets 201; Potassium 3.6; Sodium 141  Recent Lipid Panel    Component Value Date/Time   CHOL 189 09/27/2020 0501   TRIG 45 09/27/2020 0501   HDL 76 09/27/2020 0501   CHOLHDL 2.5 09/27/2020 0501   VLDL 9 09/27/2020 0501   LDLCALC 104 (H) 09/27/2020 0501    Physical Exam:    VS:  BP 108/82   Pulse 60   Ht 5\' 4"  (1.626 m)   Wt 129 lb 6.4 oz (58.7 kg)   BMI 22.21 kg/m     Wt Readings from Last 3 Encounters:  10/07/20 129 lb 6.4 oz (58.7 kg)  09/29/20 125 lb 6.4 oz (56.9 kg)  09/11/20 136 lb  (61.7 kg)     GEN:  in no acute distress HEENT: Normal NECK: No JVD; No carotid bruits CARDIAC: RRR, no murmurs, rubs, gallops RESPIRATORY:  Clear to auscultation without rales, wheezing or rhonchi  ABDOMEN: Soft, non-tender, non-distended MUSCULOSKELETAL:  No edema; No deformity  SKIN: Warm and dry NEUROLOGIC:  Alert and oriented x 3 PSYCHIATRIC:  Normal affect   ASSESSMENT:    1. NICM (nonischemic cardiomyopathy) (HCC)   2. Medication management   3. Chronic combined systolic and diastolic heart failure (HCC)   4. Frequent PVCs   5. Hyperlipidemia, unspecified hyperlipidemia type    PLAN:    Chronic combined systolic and diastolic heart failure:  Echocardiogram on 09/26/2020 showed EF 25 to 30%, severe LV dilatation, normal RV function, mild to moderate MR, mild AI.  Cardiac catheterization on 09/28/2020 showed normal coronary arteries, RA 1, RV 21/0, PA 20 over 7/12, PCWP 4, LVEDP 4, CI 3.6.  She was started on Entresto, carvedilol, spironolactone.  Cardiac monitor on 09/29/2020 showed an 8 beat run of NSVT, and 21 runs of SVT with longest lasting 18 beats, frequent PVCs (6.1%). -Appears euvolemic on exam -Continue Entresto 24-26 mg twice daily.  Will check BMET -Reported low BP and lightheadedness/blurred vision at home, spironolactone was discontinued with improvement in symptoms -Continues to have some low BPs at home, will decrease carvedilol to 3.125 mg twice daily -Cardiac MRI to evaluate etiology of nonischemic cardiomyopathy -Referred to Advanced Heart Failure  Frequent PVCs: Zio patch x 3 days on 09/29/2020 showed an 8 beat run of NSVT, and 21 runs of SVT with longest lasting 18 beats, frequent PVCs (6.1%). -Continue carvedilol as above  Hyperlipidemia: LDL 129 08/24/2020.  Does not meet indication for statin given low risk ASCVD risk score.  Normal coronary arteries as above  RTC in 1 month  Medication Adjustments/Labs and Tests Ordered: Current medicines are  reviewed at length with the patient today.  Concerns regarding medicines are outlined above.  Orders Placed This Encounter  Procedures  . MR CARDIAC MORPHOLOGY W WO CONTRAST  . Basic metabolic panel   Meds ordered this encounter  Medications  . carvedilol (COREG) 3.125 MG tablet    Sig: Take 1 tablet (3.125 mg total) by mouth 2 (two) times daily with a meal.    Dispense:  180 tablet    Refill:  3    Dose change    Patient Instructions  Medication Instructions:  DECREASE carvedilol (Coreg) to 3.125 mg two times daily  *If you need a refill on your cardiac medications before your next appointment, please call your pharmacy*   Lab Work: BMET today  If you have labs (blood work) drawn today and your tests are completely normal, you will receive your results only by: Marland Kitchen MyChart Message (if you have MyChart) OR . A paper copy in the mail If you have any lab test that is abnormal or we need to change your treatment, we will call you to review the results.   Testing/Procedures: Your physician has requested that you have a cardiac MRI. Cardiac MRI uses a computer to create images of your heart as its beating, producing both still and moving pictures of your heart and major blood vessels. For further information please visit [email or web address]. Please follow the instruction sheet given to you today for more information.  Follow-Up: At [facility], you and your health needs are our priority.  As part of our continuing mission to provide you with exceptional heart care, we have created designated Provider Care Teams.  These Care Teams include your primary Cardiologist (physician) and Advanced Practice Providers (APPs -  Physician Assistants and Nurse Practitioners) who all work together to provide you with the care you need, when you need it.  We recommend signing up for the patient portal called "MyChart".  Sign up information is provided on this After Visit Summary.  MyChart is used to  connect with patients for Virtual Visits (Telemedicine).  Patients are able to view lab/test results, encounter notes, upcoming appointments, etc.  Non-urgent messages can be sent to your provider as well.   To learn more about what you can do with MyChart, go to [email or web address].  Your next appointment:   1 month(s)  The format for your next appointment:   In Person  Provider:   Oswaldo Milian, MD        Signed, Donato Heinz, MD  10/07/2020 5:04 PM    [place]

## 2020-10-05 NOTE — Telephone Encounter (Signed)
Patient calling back in to state that the fluid has returned. She sent an update in the patient message, I have copy and pasted message below.   My abdominal fluid has returned. It started Sunday but is worse today.  I have pain in my calves which started Sunday.  The pain in my left rear shoulder area has returned this morning. It seemed the Spironolactone helped all of this but caused other side effects. The numb hands, blurred vision and headache is gone with discontinuing it but now fluid is back.  I did call the office waiting on call back and the message said to leave message on the MyChart. Thank you  Pt c/o swelling: STAT is pt has developed SOB within 24 hours  1) How much weight have you gained and in what time span?  No weight gain   2) If swelling, where is the swelling located?  In her stomach, states she is painful and miserable  3) Are you currently taking a fluid pill?  No fluid pill   4) Are you currently SOB?  No   5) Do you have a log of your daily weights (if so, list)?  n/a  6) Have you gained 3 pounds in a day or 5 pounds in a week?  No   7) Have you traveled recently?  No   Please call/advise

## 2020-10-05 NOTE — Telephone Encounter (Signed)
Forwarding to MD

## 2020-10-05 NOTE — Telephone Encounter (Signed)
Spoke with Janet Ramirez.  She reports abdominal bloating but denies any abdominal pain.  States that she worries fluid is coming back.  Notably her RHC showed low to normal pressures.  She reports her weight is stable.  Advised to continue to weigh herself daily.  If gains more than 3 pounds in a day or 5 pounds in a week, can add low-dose Lasix.  Will monitor for now.  Plan to see in clinic on 1/5.

## 2020-10-05 NOTE — Telephone Encounter (Signed)
See Telephone Note 10/05/20

## 2020-10-06 ENCOUNTER — Telehealth (HOSPITAL_COMMUNITY): Payer: Self-pay | Admitting: Internal Medicine

## 2020-10-06 ENCOUNTER — Ambulatory Visit (HOSPITAL_COMMUNITY): Admission: RE | Admit: 2020-10-06 | Payer: 59 | Source: Ambulatory Visit | Attending: Cardiology | Admitting: Cardiology

## 2020-10-06 NOTE — Telephone Encounter (Signed)
Pt called to f/u w/referral, pt can be reached @[phone] .  Thanks

## 2020-10-07 ENCOUNTER — Encounter: Payer: Self-pay | Admitting: Cardiology

## 2020-10-07 ENCOUNTER — Other Ambulatory Visit: Payer: Self-pay

## 2020-10-07 ENCOUNTER — Ambulatory Visit (INDEPENDENT_AMBULATORY_CARE_PROVIDER_SITE_OTHER): Payer: 59 | Admitting: Cardiology

## 2020-10-07 VITALS — BP 108/82 | HR 60 | Ht 64.0 in | Wt 129.4 lb

## 2020-10-07 DIAGNOSIS — I493 Ventricular premature depolarization: Secondary | ICD-10-CM

## 2020-10-07 DIAGNOSIS — I5042 Chronic combined systolic (congestive) and diastolic (congestive) heart failure: Secondary | ICD-10-CM | POA: Diagnosis not present

## 2020-10-07 DIAGNOSIS — E785 Hyperlipidemia, unspecified: Secondary | ICD-10-CM

## 2020-10-07 DIAGNOSIS — I428 Other cardiomyopathies: Secondary | ICD-10-CM | POA: Diagnosis not present

## 2020-10-07 DIAGNOSIS — Z79899 Other long term (current) drug therapy: Secondary | ICD-10-CM | POA: Diagnosis not present

## 2020-10-07 MED ORDER — CARVEDILOL 3.125 MG PO TABS: 3.1250 mg | ORAL_TABLET | Freq: Two times a day (BID) | ORAL | 3 refills | Status: DC

## 2020-10-07 NOTE — Patient Instructions (Signed)
Medication Instructions:  DECREASE carvedilol (Coreg) to 3.125 mg two times daily  *If you need a refill on your cardiac medications before your next appointment, please call your pharmacy*   Lab Work: BMET today  If you have labs (blood work) drawn today and your tests are completely normal, you will receive your results only by: Marland Kitchen MyChart Message (if you have MyChart) OR . A paper copy in the mail If you have any lab test that is abnormal or we need to change your treatment, we will call you to review the results.   Testing/Procedures: Your physician has requested that you have a cardiac MRI. Cardiac MRI uses a computer to create images of your heart as its beating, producing both still and moving pictures of your heart and major blood vessels. For further information please visit [email or web address]. Please follow the instruction sheet given to you today for more information.  Follow-Up: At [facility], you and your health needs are our priority.  As part of our continuing mission to provide you with exceptional heart care, we have created designated Provider Care Teams.  These Care Teams include your primary Cardiologist (physician) and Advanced Practice Providers (APPs -  Physician Assistants and Nurse Practitioners) who all work together to provide you with the care you need, when you need it.  We recommend signing up for the patient portal called "MyChart".  Sign up information is provided on this After Visit Summary.  MyChart is used to connect with patients for Virtual Visits (Telemedicine).  Patients are able to view lab/test results, encounter notes, upcoming appointments, etc.  Non-urgent messages can be sent to your provider as well.   To learn more about what you can do with MyChart, go to [email or web address].    Your next appointment:   1 month(s)  The format for your next appointment:   In Person  Provider:   Epifanio Lesches, MD

## 2020-10-08 ENCOUNTER — Other Ambulatory Visit: Payer: 59

## 2020-10-08 ENCOUNTER — Other Ambulatory Visit (HOSPITAL_COMMUNITY): Payer: 59

## 2020-10-08 ENCOUNTER — Telehealth: Payer: Self-pay | Admitting: Cardiology

## 2020-10-08 LAB — BASIC METABOLIC PANEL
BUN/Creatinine Ratio: 14 (ref 9–23)
BUN: 12 mg/dL (ref 6–24)
CO2: 27 mmol/L (ref 20–29)
Calcium: 9.6 mg/dL (ref 8.7–10.2)
Chloride: 101 mmol/L (ref 96–106)
Creatinine, Ser: 0.86 mg/dL (ref 0.57–1.00)
GFR calc Af Amer: 86 mL/min/{1.73_m2} (ref >59)
GFR calc non Af Amer: 74 mL/min/{1.73_m2} (ref >59)
Glucose: 86 mg/dL (ref 65–99)
Potassium: 4.7 mmol/L (ref 3.5–5.2)
Sodium: 140 mmol/L (ref 134–144)

## 2020-10-08 NOTE — Telephone Encounter (Signed)
Spoke with patient regarding preferred weekdays and times for scheduling the Cardiac MRI ordered by Dr. Elane Fritz patient as soon as we hear from the insurance prior authorization--I will call with her appointment.  She voiced her understanding.

## 2020-10-14 NOTE — Addendum Note (Signed)
Addended by: Myna Hidalgo A on: 10/14/2020 04:27 PM   Modules accepted: Orders

## 2020-10-15 ENCOUNTER — Telehealth (HOSPITAL_COMMUNITY): Payer: Self-pay | Admitting: Internal Medicine

## 2020-10-15 NOTE — Telephone Encounter (Signed)
Pt called to f/u w/referral, contact [phone]

## 2020-10-16 ENCOUNTER — Telehealth: Payer: Self-pay | Admitting: Cardiology

## 2020-10-16 NOTE — Telephone Encounter (Signed)
Spoke with patient regarding the scheduled appointment on 11/13/20 at 9:00 am at [facility] for the Cardiac MRI ordered by Dr. Andee Poles states she has all the information she needs in My Chart.

## 2020-10-18 ENCOUNTER — Emergency Department (HOSPITAL_COMMUNITY): Payer: 59

## 2020-10-18 ENCOUNTER — Other Ambulatory Visit: Payer: Self-pay

## 2020-10-18 ENCOUNTER — Encounter (HOSPITAL_COMMUNITY): Payer: Self-pay | Admitting: Emergency Medicine

## 2020-10-18 ENCOUNTER — Emergency Department (HOSPITAL_COMMUNITY)
Admission: EM | Admit: 2020-10-18 | Discharge: 2020-10-18 | Disposition: A | Discharge status: Home / Self Care | Payer: 59 | Attending: Emergency Medicine | Admitting: Emergency Medicine

## 2020-10-18 DIAGNOSIS — Z7982 Long term (current) use of aspirin: Secondary | ICD-10-CM | POA: Insufficient documentation

## 2020-10-18 DIAGNOSIS — R002 Palpitations: Secondary | ICD-10-CM | POA: Insufficient documentation

## 2020-10-18 DIAGNOSIS — R42 Dizziness and giddiness: Secondary | ICD-10-CM | POA: Diagnosis not present

## 2020-10-18 DIAGNOSIS — E039 Hypothyroidism, unspecified: Secondary | ICD-10-CM | POA: Diagnosis not present

## 2020-10-18 LAB — TROPONIN I (HIGH SENSITIVITY)
Troponin I (High Sensitivity): 17 ng/L (ref <18)
Troponin I (High Sensitivity): 19 ng/L — ABNORMAL HIGH (ref <18)

## 2020-10-18 LAB — CBC
HCT: 38.4 % (ref 36.0–46.0)
Hemoglobin: 13.1 g/dL (ref 12.0–15.0)
MCH: 32 pg (ref 26.0–34.0)
MCHC: 34.1 g/dL (ref 30.0–36.0)
MCV: 93.9 fL (ref 80.0–100.0)
Platelets: 254 10*3/uL (ref 150–400)
RBC: 4.09 MIL/uL (ref 3.87–5.11)
RDW: 12 % (ref 11.5–15.5)
WBC: 7.9 10*3/uL (ref 4.0–10.5)
nRBC: 0 % (ref 0.0–0.2)

## 2020-10-18 LAB — BASIC METABOLIC PANEL
Anion gap: 10 (ref 5–15)
BUN: 16 mg/dL (ref 6–20)
CO2: 28 mmol/L (ref 22–32)
Calcium: 9.6 mg/dL (ref 8.9–10.3)
Chloride: 102 mmol/L (ref 98–111)
Creatinine, Ser: 0.97 mg/dL (ref 0.44–1.00)
GFR, Estimated: >60 mL/min (ref >60)
Glucose, Bld: 121 mg/dL — ABNORMAL HIGH (ref 70–99)
Potassium: 3.8 mmol/L (ref 3.5–5.1)
Sodium: 140 mmol/L (ref 135–145)

## 2020-10-18 LAB — I-STAT BETA HCG BLOOD, ED (MC, WL, AP ONLY): I-stat hCG, quantitative: <5 m[IU]/mL (ref <5)

## 2020-10-18 MED ORDER — CARVEDILOL 6.25 MG PO TABS: 6.2500 mg | ORAL_TABLET | Freq: Two times a day (BID) | ORAL | 0 refills | Status: DC

## 2020-10-18 NOTE — ED Provider Notes (Signed)
Care assumed from [facility], [place], at shift change, please see their notes for full documentation of patient's complaint/HPI. Briefly, pt here with complaint of sudden onset heart palpitations that began around midnight and lasted approximately 1 hour. Pt did have a recent admission in late December for NSTEMI with findings of CHF with EF 25-35%. No additional symptoms including chest pain, SOB, nausea, vomiting, diaphoresis. Results so far show unchanged EKG (has not crossed over in the system but confirmed by Dr. Nicanor Alcon). CXR without signs of infection or fluid overload. Troponin 17 > 19. Awaiting consult to cardiology given recent cardiac event. Plan is to dispo accordingly.   Physical Exam  BP 117/65   Pulse 69   Temp 97.8 F (36.6 C) (Oral)   Resp 12   Ht 5\' 4"  (1.626 m)   Wt 58.7 kg   SpO2 97%   BMI 22.21 kg/m   Physical Exam Vitals and nursing note reviewed.  Constitutional:      Appearance: She is not ill-appearing.  HENT:     Head: Normocephalic and atraumatic.  Eyes:     Conjunctiva/sclera: Conjunctivae normal.  Cardiovascular:     Rate and Rhythm: Normal rate and regular rhythm.  Pulmonary:     Effort: Pulmonary effort is normal.     Breath sounds: Normal breath sounds.  Skin:    General: Skin is warm and dry.     Coloration: Skin is not jaundiced.  Neurological:     Mental Status: She is alert.     ED Course/Procedures     Procedures Results for orders placed or performed during the hospital encounter of 10/18/20  Basic metabolic panel  Result Value Ref Range   Sodium 140 135 - 145 mmol/L   Potassium 3.8 3.5 - 5.1 mmol/L   Chloride 102 98 - 111 mmol/L   CO2 28 22 - 32 mmol/L   Glucose, Bld 121 (H) 70 - 99 mg/dL   BUN 16 6 - 20 mg/dL   Creatinine, Ser 10/20/20 0.44 - 1.00 mg/dL   Calcium 9.6 8.9 - 0.10 mg/dL   GFR, Estimated 27.2 >53 mL/min   Anion gap 10 5 - 15  CBC  Result Value Ref Range   WBC 7.9 4.0 - 10.5 K/uL   RBC 4.09 3.87 - 5.11 MIL/uL    Hemoglobin 13.1 12.0 - 15.0 g/dL   HCT >66 44.0 - 34.7 %   MCV 93.9 80.0 - 100.0 fL   MCH 32.0 26.0 - 34.0 pg   MCHC 34.1 30.0 - 36.0 g/dL   RDW 42.5 95.6 - 38.7 %   Platelets 254 150 - 400 K/uL   nRBC 0.0 0.0 - 0.2 %  I-Stat beta hCG blood, ED  Result Value Ref Range   I-stat hCG, quantitative <5.0 <5 mIU/mL   Comment 3          Troponin I (High Sensitivity)  Result Value Ref Range   Troponin I (High Sensitivity) 17 <18 ng/L  Troponin I (High Sensitivity)  Result Value Ref Range   Troponin I (High Sensitivity) 19 (H) <18 ng/L   DG Chest 2 View  Result Date: 10/18/2020 CLINICAL DATA:  Racing heart.  Dizziness. EXAM: CHEST - 2 VIEW COMPARISON:  09/26/2020 FINDINGS: There is cardiomegaly. No pneumothorax. No large pleural effusion or focal infiltrate. No acute osseous abnormality. IMPRESSION: Cardiomegaly without edema or focal infiltrate. Electronically Signed   By: 09/28/2020 M.D.   On: 10/18/2020 02:57    MDM  Discussed case  with cardiologist Dr. Rennis Golden who recommends increasing pt's carvedilol from 3.125 BID to 6.25 BID with outpatient cardiology follow up.  Have updated patient on plan; she is in agreement. Does appear that she was recently on a higher dose of carvedilol but decreased due to hypotension. Have advised that she check her blood pressures regularly and if she begins having any dizziness/lightheadedness to decrease to 3.125 again. Pt also instructed on strict return precautions. She is in agreement with plan and stable for discharge.   This note was prepared using Dragon voice recognition software and may include unintentional dictation errors due to the inherent limitations of voice recognition software.      Tanda Rockers, PA-C 10/18/20 [id number]    Gwyneth Sprout, MD 10/22/20 1517

## 2020-10-18 NOTE — Discharge Instructions (Addendum)
Please increase your carvedilol from 3.125 to 6.25 twice daily. If you begin experiencing any dizziness/lightheadedness or if your blood pressure begins to decrease significantly then decrease back to 3.125.   Follow up with Dr. Milus Mallick by calling the office Monday and scheduling a recheck appointment per his recommendation.  Return to the ED with any new or concerning symptoms at any time.

## 2020-10-18 NOTE — ED Notes (Signed)
Received pt at this time from triage, ambulatory with steady gait.

## 2020-10-18 NOTE — ED Triage Notes (Addendum)
Pt presents to ED POV. Pt c/o "racing HR". Pt states that around midnight she was lying in bed and began to feel her heart race. Pt also states onset of dizziness w/ HR.Pt hx of MI 09/26/20

## 2020-10-18 NOTE — ED Provider Notes (Signed)
[facility] EMERGENCY DEPARTMENT Provider Note   CSN: [id number] Arrival date & time: 10/18/20  0216     History Chief Complaint  Patient presents with  . Palpitations    Janet Ramirez is a 60 y.o. female.  Patient to ED for evaluation of episode of heart racing that started around midnight and lasted approximately one hour. No chest pain, SOB, nausea, diaphoresis. She became dizzy with symptoms but there was no syncope/near syncope. History of admission for NSTEMI 09/2020 but reports she had a negative catheterization, ultimately being diagnosed with left heart failure. She was on Lasix but reports resultant hypotension and was taken off. Started on Coreg and entresto during admission.   The history is provided by the patient. No language interpreter was used.  Palpitations Associated symptoms: dizziness   Associated symptoms: no chest pain, no nausea and no shortness of breath        Past Medical History:  Diagnosis Date  . Hyperlipidemia   . Hypothyroidism     Patient Active Problem List   Diagnosis Date Noted  . Palpitations 09/29/2020  . Non-ischemic cardiomyopathy (HCC) 09/29/2020  . Hyperlipemia 09/29/2020  . 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 Elevated troponin   . Acute systolic CHF (congestive heart failure) (HCC)   . NSTEMI (non-ST elevated myocardial infarction) (HCC) 09/26/2020  . Chest pain 09/26/2020    Past Surgical History:  Procedure Laterality Date  . ABDOMINAL HYSTERECTOMY  1980s  . RIGHT/LEFT HEART CATH AND CORONARY ANGIOGRAPHY N/A 09/28/2020   Procedure: RIGHT/LEFT HEART CATH AND CORONARY ANGIOGRAPHY;  Surgeon: [place], Peter M, MD;  Location: [facility] INVASIVE CV LAB;  Service: Cardiovascular;  Laterality: N/A;     OB History   No obstetric history on file.     Family History  Problem Relation Age of Onset  . Hypertension Mother   . Heart disease Father        s/p stent and pacemaker placed in his 39s  . Diabetes Mellitus II Father      Social History   Tobacco Use  . Smoking status: Never Smoker  . Smokeless tobacco: Never Used  Substance Use Topics  . Alcohol use: Yes    Comment: socially  . Drug use: Never    Home Medications Prior to Admission medications   Medication Sig Start Date End Date Taking? Authorizing Provider  alum & mag hydroxide-simeth (MAALOX/MYLANTA) 200-200-20 MG/5ML suspension Take 30 mLs by mouth every 6 (six) hours as needed for indigestion or heartburn.    [provider]  Ascorbic Acid (VITAMIN C) 1000 MG tablet Take 1,000 mg by mouth daily. 1 Tablet Daily    [provider]  aspirin 325 MG tablet Take 325 mg by mouth as needed for mild pain.    [provider]  calcium carbonate (TUMS - DOSED IN MG ELEMENTAL CALCIUM) 500 MG chewable tablet Chew 1-2 tablets by mouth as needed for indigestion or heartburn.    [provider]  carvedilol (COREG) 3.125 MG tablet Take 1 tablet (3.125 mg total) by mouth 2 (two) times daily with a meal. 10/07/20   Little Ishikawa, MD  cholecalciferol (VITAMIN D3) 25 MCG (1000 UNIT) tablet Take 1,000 Units by mouth daily. 1 Tablet Daily    [provider]  sacubitril-valsartan (ENTRESTO) 24-26 MG Take 1 tablet by mouth 2 (two) times daily. 09/29/20   Manson Passey, PA  simethicone (MYLICON) 125 MG chewable tablet Chew 125 mg by mouth every 6 (six) hours as needed for  flatulence.    [provider]  zinc gluconate 50 MG tablet Take 50 mg by mouth daily. 1 Tablet Daily    [provider]    Allergies    Codeine and Sulfacetamide sodium  Review of Systems   Review of Systems  Constitutional: Negative for chills and fever.  HENT: Negative.   Respiratory: Negative.  Negative for shortness of breath.   Cardiovascular: Positive for palpitations. Negative for chest pain and leg swelling.  Gastrointestinal: Negative.  Negative for abdominal pain and nausea.  Musculoskeletal: Negative.    Skin: Negative.   Neurological: Positive for dizziness. Negative for syncope.    Physical Exam Updated Vital Signs BP 125/73   Pulse 65   Temp 97.8 F (36.6 C) (Oral)   Resp 15   Ht 5\' 4"  (1.626 m)   Wt 58.7 kg   SpO2 100%   BMI 22.21 kg/m   Physical Exam Vitals and nursing note reviewed.  Constitutional:      Appearance: Normal appearance.  Cardiovascular:     Rate and Rhythm: Normal rate and regular rhythm.     Heart sounds: No murmur heard.   Pulmonary:     Effort: Pulmonary effort is normal.     Breath sounds: No wheezing, rhonchi or rales.  Abdominal:     General: There is no distension.     Palpations: Abdomen is soft.     Tenderness: There is no abdominal tenderness.  Musculoskeletal:        General: Normal range of motion.     Cervical back: Normal range of motion and neck supple.     Right lower leg: No edema.     Left lower leg: No edema.  Skin:    General: Skin is warm and dry.  Neurological:     Mental Status: She is alert.     ED Results / Procedures / Treatments   Labs (all labs ordered are listed, but only abnormal results are displayed) Labs Reviewed  BASIC METABOLIC PANEL - Abnormal; Notable for the following components:      Result Value   Glucose, Bld 121 (*)    All other components within normal limits  TROPONIN I (HIGH SENSITIVITY) - Abnormal; Notable for the following components:   Troponin I (High Sensitivity) 19 (*)    All other components within normal limits  CBC  I-STAT BETA HCG BLOOD, ED ([facility], [facility], [facility] ONLY)  TROPONIN I (HIGH SENSITIVITY)    EKG None  Radiology DG Chest 2 View  Result Date: 10/18/2020 CLINICAL DATA:  Racing heart.  Dizziness. EXAM: CHEST - 2 VIEW COMPARISON:  09/26/2020 FINDINGS: There is cardiomegaly. No pneumothorax. No large pleural effusion or focal infiltrate. No acute osseous abnormality. IMPRESSION: Cardiomegaly without edema or focal infiltrate. Electronically Signed   By: 09/28/2020 M.D.    On: 10/18/2020 02:57    Procedures Procedures (including critical care time)  Medications Ordered in ED Medications - No data to display  ED Course  I have reviewed the triage vital signs and the nursing notes.  Pertinent labs & imaging results that were available during my care of the patient were reviewed by me and considered in my medical decision making (see chart for details).    MDM Rules/Calculators/A&P                          Patient to ED with sudden onset painless palpitations tonight around midnight lasting about one hour.  Resolved spontaneously and has not recurred.   Admission over Christmas for NSTEMI, diagnosed by cath with left heart failure, EF 25-30%. Labs, imaging reviewed and are essentially benign, trops 17 and 19. No pain at any time. CXR with CM, no edema or infiltrate. EKG unchanged - not crossing over, documented in MUSE and reviewed by Dr. Nicanor Alcon.   Patient has remained asymptomatic for entire duration ED encounter. Will consult cardiology given close proximity of admission for new diagnosis NICM, left heart failure, to review presentation and provide follow up.    Final Clinical Impression(s) / ED Diagnoses Final diagnoses:  None   1. Palpitations  Rx / DC Orders ED Discharge Orders    None       Danne Harbor 10/18/20 2212    Palumbo, April, MD 10/18/20 2308

## 2020-10-29 ENCOUNTER — Telehealth: Payer: Self-pay | Admitting: Cardiology

## 2020-10-29 NOTE — Telephone Encounter (Signed)
Returned call to patient who reports issues with back pain and hand numbness since discharge from hospital.  She states she has the back pain daily, lower back on both sides.    She went to bed last night and woke up to her hand numb and her arm was cold to touch.   She states this was different than her armhand being "asleep".  She kept moving it around and this did not resolve. She was able to move her arm and lift arm, her hand was the only thing numb.   It lasted approximately 30 mins and resolved on its own.   She states she feels as if her joints in her hands are hard to move.    She does report back pain prior to admission but this was upper back pain, no history of back issues.     She has taken tylenol and used a heating pad with help in back pain.   BP 106/71 this AM, no dizziness, lightheadedness, SOB, CP.      Advised unsure this is related to medication-sounds more musculoskeletal/nerve related but will route to MD to review for further recommendations.  Patient aware.   Current cardiac meds: coreg, entresto

## 2020-10-29 NOTE — Telephone Encounter (Signed)
Agree, sounds MSK or nerve related, would recommend PCP evaluation

## 2020-10-29 NOTE — Telephone Encounter (Signed)
Patient states last night had she some back pain and woke up with her right arm numb and then become cold. She states it lasted a while, but is not having symptoms now. She states she has had problems with her joints since she got out of the hospital. Please advise.

## 2020-10-30 NOTE — Telephone Encounter (Signed)
Spoke to patient, aware of recommendations and verbalized understanding.   

## 2020-11-03 ENCOUNTER — Other Ambulatory Visit: Payer: Self-pay | Admitting: Physician Assistant

## 2020-11-03 DIAGNOSIS — R002 Palpitations: Secondary | ICD-10-CM

## 2020-11-03 DIAGNOSIS — I493 Ventricular premature depolarization: Secondary | ICD-10-CM

## 2020-11-03 NOTE — Progress Notes (Signed)
Cardiology Office Note:    Date:  11/05/2020   ID:  Janet Ramirez, DOB 1961-02-02, MRN [id number]  PCP:  Mitzi Hansen, NP  Cardiologist:  Little Ishikawa, MD  Electrophysiologist:  None   Referring MD: Mitzi Hansen, NP   Chief Complaint  Patient presents with  . Congestive Heart Failure    History of Present Illness:    Janet Ramirez is a 60 y.o. female with a hx of hypothyroidism, hyperlipidemia who presents for follow-up.  She was referred by Mitzi Hansen, NP for evaluation of EKG abnormalities, initially seen on 09/11/2020.  She reports that she has been having shortness of breath and chest pain.  States that she notes shortness of breath with exertion, particular when walking up hills.  In addition has been having chest pain, that often occurs at night.  Describes as left-sided dull aching pain/pressure.  Radiates to back.  5 out of 10 in intensity.  Last for about 30 minutes.  States that antacid used to help when she was having the pain but have not been helping recently.  Also reports occasional lightheadedness, denies any syncope.  Denies any lower extremity edema.  Reports has been having palpitations for years.  Occurs about once per week, last for few seconds and resolves.  No smoking history. No alcohol use. Family history includes father had PCI in 17s.  Given her chest pain and shortness of breath, at initial clinic visit on 09/11/2020 [facility] and echocardiogram were ordered.  In addition given frequent PVCs on EKG, Zio patch x3 days was ordered.  However on 09/27/2019 she presented to [facility] with chest pain.  Found of elevated troponin to 187.  EKG with nonspecific T wave changes and frequent PVCs.  Echocardiogram on 09/26/2020 showed EF 25 to 30%, severe LV dilatation, normal RV function, mild to moderate MR, mild AI.  Cardiac catheterization on 09/28/2020 showed normal coronary arteries, RA 1, RV 21/0, PA 20 over 7/12, PCWP 4, LVEDP 4, CI 3.6.  She was started on  Entresto, carvedilol, spironolactone.  Reported low BP and lightheadedness/blurred vision at home, spironolactone was held.  Cardiac monitor on 09/29/2020 showed one 8 beat run of NSVT, and 21 runs of SVT with longest lasting 18 beats, frequent PVCs (6.1%).  Since last clinic visit, she reports that she has been doing okay.  Reports BP has been as low as 80s over 60s at home.  States that she feels numbness in her arm and hand when her BP is low.  She had an episode where he felt like heart was racing prompting her to go to the ED on 10/18/2020.  Her carvedilol was increased to 6.25 mg twice daily at that time.  Reports BP has been lower since this increase.  Reports stable dyspnea.  Has been having issues with back pain.  Continues to have intermittent chest pain.  She denies any nausea or edema, lightheadedness, or syncope.   Wt Readings from Last 3 Encounters:  11/04/20 126 lb 9.6 oz (57.4 kg)  10/18/20 129 lb 6.6 oz (58.7 kg)  10/07/20 129 lb 6.4 oz (58.7 kg)      Current Medications: Current Meds  Medication Sig  . alum & mag hydroxide-simeth (MAALOX/MYLANTA) 200-200-20 MG/5ML suspension Take 30 mLs by mouth every 6 (six) hours as needed for indigestion or heartburn.  Marland Kitchen amoxicillin (AMOXIL) 500 MG capsule Take 1,000 mg by mouth 2 (two) times daily.  . Ascorbic Acid (VITAMIN C) 1000 MG tablet Take 1,000 mg by mouth  daily. 1 Tablet Daily  . calcium carbonate (TUMS - DOSED IN MG ELEMENTAL CALCIUM) 500 MG chewable tablet Chew 1-2 tablets by mouth as needed for indigestion or heartburn.  . cholecalciferol (VITAMIN D3) 25 MCG (1000 UNIT) tablet Take 1,000 Units by mouth daily. 1 Tablet Daily  . sacubitril-valsartan (ENTRESTO) 24-26 MG Take 1 tablet by mouth 2 (two) times daily.  . simethicone (MYLICON) 125 MG chewable tablet Chew 125 mg by mouth every 6 (six) hours as needed for flatulence.  . zinc gluconate 50 MG tablet Take 50 mg by mouth daily. 1 Tablet Daily  . [DISCONTINUED] aspirin 325 MG  tablet Take 325 mg by mouth as needed for mild pain.  . [DISCONTINUED] carvedilol (COREG) 3.125 MG tablet Take 1 tablet (3.125 mg total) by mouth 2 (two) times daily with a meal.     Allergies:   Codeine and Sulfacetamide sodium   Social History   Socioeconomic History  . Marital status: Married    Spouse name: Not on file  . Number of children: Not on file  . Years of education: Not on file  . Highest education level: Not on file  Occupational History  . Not on file  Tobacco Use  . Smoking status: Never Smoker  . Smokeless tobacco: Never Used  Substance and Sexual Activity  . Alcohol use: Yes    Comment: socially  . Drug use: Never  . Sexual activity: Yes    Partners: Male  Other Topics Concern  . Not on file  Social History Narrative  . Not on file   Social Determinants of Health   Financial Resource Strain: Not on file  Food Insecurity: Not on file  Transportation Needs: Not on file  Physical Activity: Not on file  Stress: Not on file  Social Connections: Not on file     Family History: Family history includes father had PCI in 33s.  ROS:   Please see the history of present illness.    All other systems reviewed and are negative.  EKGs/Labs/Other Studies Reviewed:    The following studies were reviewed today:   EKG:  EKG is ordered today.  The ekg ordered today demonstrates sinus rhythm, rate 60, PACs, LVH with repolarization abnormalities  Recent Labs: 09/26/2020: ALT 19; B Natriuretic Peptide 608.1; Magnesium 2.2; TSH 2.261 10/18/2020: BUN 16; Creatinine, Ser 0.97; Hemoglobin 13.1; Platelets 254; Potassium 3.8; Sodium 140  Recent Lipid Panel    Component Value Date/Time   CHOL 189 09/27/2020 0501   TRIG 45 09/27/2020 0501   HDL 76 09/27/2020 0501   CHOLHDL 2.5 09/27/2020 0501   VLDL 9 09/27/2020 0501   LDLCALC 104 (H) 09/27/2020 0501    Physical Exam:    VS:  BP 110/72   Pulse 60   Ht 5\' 4"  (1.626 m)   Wt 126 lb 9.6 oz (57.4 kg)   SpO2 99%    BMI 21.73 kg/m     Wt Readings from Last 3 Encounters:  11/04/20 126 lb 9.6 oz (57.4 kg)  10/18/20 129 lb 6.6 oz (58.7 kg)  10/07/20 129 lb 6.4 oz (58.7 kg)     GEN:  in no acute distress HEENT: Normal NECK: No JVD; No carotid bruits CARDIAC: RRR, no murmurs, rubs, gallops RESPIRATORY:  Clear to auscultation without rales, wheezing or rhonchi  ABDOMEN: Soft, non-tender, non-distended MUSCULOSKELETAL:  No edema; No deformity  SKIN: Warm and dry NEUROLOGIC:  Alert and oriented x 3 PSYCHIATRIC:  Normal affect   ASSESSMENT:  1. Chronic combined systolic and diastolic heart failure (HCC)   2. Frequent PVCs   3. Hyperlipidemia, unspecified hyperlipidemia type    PLAN:    Chronic combined systolic and diastolic heart failure: Echocardiogram on 09/26/2020 showed EF 25 to 30%, severe LV dilatation, normal RV function, mild to moderate MR, mild AI.  Cardiac catheterization on 09/28/2020 showed normal coronary arteries, RA 1, RV 21/0, PA 20 over 7/12, PCWP 4, LVEDP 4, CI 3.6.  Cardiac monitor on 09/29/2020 showed an 8 beat run of NSVT, and 21 runs of SVT with longest lasting 18 beats, frequent PVCs (6.1%). -Appears euvolemic on exam -Continue Entresto 24-26 mg twice daily.   -Reported low BP and lightheadedness/blurred vision at home, spironolactone was discontinued with improvement in symptoms -Continues to have some low BPs at home, will decrease carvedilol to 3.125 mg twice daily -Cardiac MRI to evaluate etiology of nonischemic cardiomyopathy, scheduled for 2/11 -Referred to Advanced Heart Failure, has appointment with Dr. Gala Romney on 2/28  Frequent PVCs: Zio patch x 3 days on 09/29/2020 showed an 8 beat run of NSVT, and 21 runs of SVT with longest lasting 18 beats, frequent PVCs (6.1%). -Continue carvedilol as above  Hyperlipidemia: LDL 129 08/24/2020.  Does not meet indication for statin given low risk ASCVD risk score.  Normal coronary arteries as above    Medication  Adjustments/Labs and Tests Ordered: Current medicines are reviewed at length with the patient today.  Concerns regarding medicines are outlined above.  No orders of the defined types were placed in this encounter.  Meds ordered this encounter  Medications  . DISCONTD: carvedilol (COREG) 3.125 MG tablet    Sig: Take 1 tablet (3.125 mg total) by mouth 2 (two) times daily with a meal for 15 days.    Dispense:  180 tablet    Refill:  3    NEW DOSE, D/C 6.25 MG RX. PUT ON FILE, WILL CALL WHEN NEEDS FILLED  . carvedilol (COREG) 3.125 MG tablet    Sig: Take 1 tablet (3.125 mg total) by mouth 2 (two) times daily with a meal.    Dispense:  180 tablet    Refill:  3    NEW DOSE, D/C 6.25 MG RX. PUT ON FILE, WILL CALL WHEN NEEDS FILLED    Patient Instructions  Medication Instructions:  DECREASE YOUR CARVEDILOL TO 3.125 MG TWICE A DAY  *If you need a refill on your cardiac medications before your next appointment, please call your pharmacy*   Lab Work: NONE   Testing/Procedures: NONE   Follow-Up: At [facility], you and your health needs are our priority.  As part of our continuing mission to provide you with exceptional heart care, we have created designated Provider Care Teams.  These Care Teams include your primary Cardiologist (physician) and Advanced Practice Providers (APPs -  Physician Assistants and Nurse Practitioners) who all work together to provide you with the care you need, when you need it.  We recommend signing up for the patient portal called "MyChart".  Sign up information is provided on this After Visit Summary.  MyChart is used to connect with patients for Virtual Visits (Telemedicine).  Patients are able to view lab/test results, encounter notes, upcoming appointments, etc.  Non-urgent messages can be sent to your provider as well.   To learn more about what you can do with MyChart, go to [email or web address].    Your next appointment:   AS NEEDED       Signed, Little Ishikawa, MD  11/05/2020 11:29 PM    [facility]

## 2020-11-04 ENCOUNTER — Ambulatory Visit (INDEPENDENT_AMBULATORY_CARE_PROVIDER_SITE_OTHER): Payer: 59 | Admitting: Cardiology

## 2020-11-04 ENCOUNTER — Other Ambulatory Visit: Payer: Self-pay

## 2020-11-04 ENCOUNTER — Encounter: Payer: Self-pay | Admitting: Cardiology

## 2020-11-04 VITALS — BP 110/72 | HR 60 | Ht 64.0 in | Wt 126.6 lb

## 2020-11-04 DIAGNOSIS — I493 Ventricular premature depolarization: Secondary | ICD-10-CM | POA: Diagnosis not present

## 2020-11-04 DIAGNOSIS — E785 Hyperlipidemia, unspecified: Secondary | ICD-10-CM

## 2020-11-04 DIAGNOSIS — I5042 Chronic combined systolic (congestive) and diastolic (congestive) heart failure: Secondary | ICD-10-CM | POA: Diagnosis not present

## 2020-11-04 MED ORDER — CARVEDILOL 3.125 MG PO TABS: 3.1250 mg | ORAL_TABLET | Freq: Two times a day (BID) | ORAL | 3 refills | Status: DC

## 2020-11-04 NOTE — Patient Instructions (Signed)
Medication Instructions:  DECREASE YOUR CARVEDILOL TO 3.125 MG TWICE A DAY  *If you need a refill on your cardiac medications before your next appointment, please call your pharmacy*   Lab Work: NONE   Testing/Procedures: NONE   Follow-Up: At [facility], you and your health needs are our priority.  As part of our continuing mission to provide you with exceptional heart care, we have created designated Provider Care Teams.  These Care Teams include your primary Cardiologist (physician) and Advanced Practice Providers (APPs -  Physician Assistants and Nurse Practitioners) who all work together to provide you with the care you need, when you need it.  We recommend signing up for the patient portal called "MyChart".  Sign up information is provided on this After Visit Summary.  MyChart is used to connect with patients for Virtual Visits (Telemedicine).  Patients are able to view lab/test results, encounter notes, upcoming appointments, etc.  Non-urgent messages can be sent to your provider as well.   To learn more about what you can do with MyChart, go to [email or web address].    Your next appointment:   AS NEEDED

## 2020-11-12 ENCOUNTER — Telehealth (HOSPITAL_COMMUNITY): Payer: Self-pay | Admitting: *Deleted

## 2020-11-12 NOTE — Telephone Encounter (Signed)
Reaching out to patient to offer assistance regarding upcoming cardiac imaging study; pt verbalizes understanding of appt date/time, parking situation and where to check in, and verified current allergies; name and call back number provided for further questions should they arise  Mikailah Morel RN Navigator Cardiac Imaging [facility] [phone] office [phone] cell  

## 2020-11-13 ENCOUNTER — Other Ambulatory Visit: Payer: Self-pay

## 2020-11-13 ENCOUNTER — Ambulatory Visit (HOSPITAL_COMMUNITY)
Admission: RE | Admit: 2020-11-13 | Discharge: 2020-11-13 | Disposition: A | Discharge status: Home / Self Care | Payer: 59 | Source: Ambulatory Visit | Attending: Cardiology | Admitting: Cardiology

## 2020-11-13 DIAGNOSIS — I428 Other cardiomyopathies: Secondary | ICD-10-CM | POA: Insufficient documentation

## 2020-11-13 MED ORDER — GADOBUTROL 1 MMOL/ML IV SOLN
7.0000 mL | Freq: Once | INTRAVENOUS | Status: AC | PRN
  Administered 2020-11-13: 7 mL via INTRAVENOUS

## 2020-11-25 NOTE — Progress Notes (Signed)
ADVANCED HF CLINIC CONSULT NOTE  Referring Physician: Dr. Bjorn Pippin Primary Care: Mitzi Hansen, NP Primary Cardiologist: Little Ishikawa, MD  HPI:  Janet Ramirez is a 60 yo woman with hypothyroidism, HL and systolic HF referred by Dr. Bjorn Pippin for further evaluation of her HF.   No known h/o of heart problems until last year. Admitted 12/21 with CP and mildly elevated hstrop. Echo 12/21 EF 25-30%, severe LV dilatation, normal RV function, mild to moderate MR, mild AI.  Cath showed normal coronary arteries with well-compensated hemodynamics, 3-day Zio showed one 8 beat run of NSVT, and 21 runs of SVT with longest lasting 18 beats, frequent PVCs (6.1%).  cMRI 11/13/20: 1. LVEF 35% 2.  Normal RV size and systolic function (EF 58%) 3. Basal septal midwall LGE and RV insertion LGE, which is a scar pattern seen in nonischemic cardiomyopathies and associated with worse prognosis 4.  Mild aortic regurgitation, regurgitant fraction 9%  28 day event monitor 1/22:  PVCs 2%  HF meds limited by low BP.   She is here with her husband. Starting to feel a bit better. More energy. Able to do household chores without too much problem but if does too much gets tired easily and back hurts. No edema, orthopnea or PND. No snoring.    Review of Systems: [y] = yes, [ ]  = no   General: Weight gain [ ] ; Weight loss [ ] ; Anorexia [ ] ; Fatigue ]; Fever [ ] ; Chills [ ] ; Weakness [ ]   Cardiac: Chest pain/pressure [ ] ; Resting SOB [ ] ; Exertional SOB [ ] ; Orthopnea [ ] ; Pedal Edema [ ] ; Palpitations ]; Syncope [ ] ; Presyncope [ ] ; Paroxysmal nocturnal dyspnea[ ]   Pulmonary: Cough [ ] ; Wheezing[ ] ; Hemoptysis[ ] ; Sputum [ ] ; Snoring [ ]   GI: Vomiting[ ] ; Dysphagia[ ] ; Melena[ ] ; Hematochezia [ ] ; Heartburn[ ] ; Abdominal pain [ ] ; Constipation [ ] ; Diarrhea [ ] ; BRBPR [ ]   GU: Hematuria[ ] ; Dysuria [ ] ; Nocturia[ ]   Vascular: Pain in legs with walking [ ] ; Pain in feet with lying flat [ ] ; Non-healing  sores [ ] ; Stroke [ ] ; TIA [ ] ; Slurred speech [ ] ;  Neuro: Headaches[ ] ; Vertigo[ ] ; Seizures[ ] ; Paresthesias[ ] ;Blurred vision [ ] ; Diplopia [ ] ; Vision changes [ ]   Ortho/Skin: Arthritis ]; Joint pain ]; Muscle pain [ ] ; Joint swelling [ ] ; Back Pain [ ] ; Rash [ ]   Psych: Depression[ ] ; Anxiety[ ]   Heme: Bleeding problems [ ] ; Clotting disorders [ ] ; Anemia [ ]   Endocrine: Diabetes [ ] ; Thyroid dysfunction[ ]    Past Medical History:  Diagnosis Date  . Hyperlipidemia   . Hypothyroidism     Current Outpatient Medications  Medication Sig Dispense Refill  . alum & mag hydroxide-simeth (MAALOX/MYLANTA) 200-200-20 MG/5ML suspension Take 30 mLs by mouth every 6 (six) hours as needed for indigestion or heartburn.    amoxicillin (AMOXIL) 500 MG capsule Take 1,000 mg by mouth 2 (two) times daily.    . Ascorbic Acid (VITAMIN C) 1000 MG tablet Take 1,000 mg by mouth daily. 1 Tablet Daily    . calcium carbonate (TUMS - DOSED IN MG ELEMENTAL CALCIUM) 500 MG chewable tablet Chew 1-2 tablets by mouth as needed for indigestion or heartburn.    . carvedilol (COREG) 3.125 MG tablet Take 1 tablet (3.125 mg total) by mouth 2 (two) times daily with a meal. 180 tablet 3  . cholecalciferol (VITAMIN D3) 25 MCG (1000 UNIT)  tablet Take 1,000 Units by mouth daily. 1 Tablet Daily    . sacubitril-valsartan (ENTRESTO) 24-26 MG Take 1 tablet by mouth 2 (two) times daily. 60 tablet 6  . simethicone (MYLICON) 125 MG chewable tablet Chew 125 mg by mouth every 6 (six) hours as needed for flatulence.    . zinc gluconate 50 MG tablet Take 50 mg by mouth daily. 1 Tablet Daily     No current facility-administered medications for this encounter.    Allergies  Allergen Reactions  . Codeine Itching  . Sulfacetamide Sodium Diarrhea      Social History   Socioeconomic History  . Marital status: Married    Spouse name: Not on file  . Number of children: Not on file  . Years of education: Not on file  .  Highest education level: Not on file  Occupational History  . Not on file  Tobacco Use  . Smoking status: Never Smoker  . Smokeless tobacco: Never Used  Substance and Sexual Activity  . Alcohol use: Yes    Comment: socially  . Drug use: Never  . Sexual activity: Yes    Partners: Male  Other Topics Concern  . Not on file  Social History Narrative  . Not on file   Social Determinants of Health   Financial Resource Strain: Not on file  Food Insecurity: Not on file  Transportation Needs: Not on file  Physical Activity: Not on file  Stress: Not on file  Social Connections: Not on file  Intimate Partner Violence: Not on file      Family History  Problem Relation Age of Onset  . Hypertension Mother   . Heart disease Father        s/p stent and pacemaker placed in his 38s  . Diabetes Mellitus II Father     Vitals:   11/26/20 1432  BP: 122/70  Pulse: 70  SpO2: 98%  Weight: 57 kg (125 lb 9.6 oz)    PHYSICAL EXAM: General:  Well appearing. No respiratory difficulty HEENT: normal Neck: supple. no JVD. Carotids 2+ bilat; no bruits. No lymphadenopathy or thryomegaly appreciated. Cor: PMI nondisplaced. Mildly irregular  No rubs, gallops or murmurs. Lungs: clear Abdomen: soft, nontender, nondistended. No hepatosplenomegaly. No bruits or masses. Good bowel sounds. Extremities: no cyanosis, clubbing, rash, edema Neuro: alert & oriented x 3, cranial nerves grossly intact. moves all 4 extremities w/o difficulty. Affect pleasant.  ECG: NSR 75 with frequent monomorphic PVCs. Personally reviewed    ASSESSMENT & PLAN:  1. 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 - Echo 12/21 EF 25 to 30%, severe LV dilatation, normal RV function, mild to moderate MR, mild AI.   - Cath 12/21 normal coronary arteries - Zio 09/2020 showed an 8 beat run of NSVT, and 21 runs of SVT with longest lasting 18 beats, frequent PVCs (6.1% with 3 morphologies. Highest daily burden 8.7%) -  28 day event monitor 1/22:  PVCs 2% - cMRI cMRI 11/13/20: LVEF 35% RVEF 58%. Basal septal midwall LGE and RV insertion LGE - Etiology unclear. Possible PVC CM but PVC burden < 10% on Zio and 2% on recent event montior - NYHA II - Volume status ok  - Continue carvedilol 3.125 bid (recently decreased due to low BP) - Continue Entresto 24-26 mg twice daily.   - Off spiro due to hypotension - Difficult case but I suspect PVCs are the smoking gun here and may be getting better though nearly 25% on today's  ECG. Will start mexilitene 200 bid. Repeat Zio in 1 month to see if PVCs completely suppressed. IF so, will then repeat echo in 1-2 months after that to reassess EF.   2. Frequent PVCs:  - Zio patch x 3 days on 09/29/2020 showed an 8 beat run of NSVT, and 21 runs of SVT with longest lasting 18 beats, frequent PVCs (6.1% with 3 morphologies. Highest daily burden 8.7%) - 28 day event monitor 1/22:  PVCs 2% - Plan as above  Arvilla Meres, MD  10:52 PM

## 2020-11-26 ENCOUNTER — Other Ambulatory Visit: Payer: Self-pay

## 2020-11-26 ENCOUNTER — Encounter (HOSPITAL_COMMUNITY): Payer: Self-pay | Admitting: Internal Medicine

## 2020-11-26 ENCOUNTER — Ambulatory Visit (HOSPITAL_COMMUNITY)
Admission: RE | Admit: 2020-11-26 | Discharge: 2020-11-26 | Disposition: A | Discharge status: Home / Self Care | Payer: 59 | Source: Ambulatory Visit | Attending: Internal Medicine | Admitting: Internal Medicine

## 2020-11-26 VITALS — BP 122/70 | HR 70 | Wt 125.6 lb

## 2020-11-26 DIAGNOSIS — Z885 Allergy status to narcotic agent status: Secondary | ICD-10-CM | POA: Insufficient documentation

## 2020-11-26 DIAGNOSIS — E039 Hypothyroidism, unspecified: Secondary | ICD-10-CM | POA: Insufficient documentation

## 2020-11-26 DIAGNOSIS — I5022 Chronic systolic (congestive) heart failure: Secondary | ICD-10-CM

## 2020-11-26 DIAGNOSIS — Z8249 Family history of ischemic heart disease and other diseases of the circulatory system: Secondary | ICD-10-CM | POA: Insufficient documentation

## 2020-11-26 DIAGNOSIS — Z79899 Other long term (current) drug therapy: Secondary | ICD-10-CM | POA: Insufficient documentation

## 2020-11-26 DIAGNOSIS — E785 Hyperlipidemia, unspecified: Secondary | ICD-10-CM | POA: Insufficient documentation

## 2020-11-26 DIAGNOSIS — I5042 Chronic combined systolic (congestive) and diastolic (congestive) heart failure: Secondary | ICD-10-CM | POA: Diagnosis not present

## 2020-11-26 DIAGNOSIS — I428 Other cardiomyopathies: Secondary | ICD-10-CM | POA: Insufficient documentation

## 2020-11-26 DIAGNOSIS — I493 Ventricular premature depolarization: Secondary | ICD-10-CM

## 2020-11-26 MED ORDER — MEXILETINE HCL 200 MG PO CAPS: 200.0000 mg | ORAL_CAPSULE | Freq: Two times a day (BID) | ORAL | 3 refills | Status: DC

## 2020-11-26 NOTE — Patient Instructions (Addendum)
START Mexitil 200mg  (1 tablet) twice daily  Your physician recommends that you schedule a follow-up appointment in: 4-6 weeks  If you have any questions or concerns before your next appointment please send a message through [place] or call our office at [phone].    TO LEAVE A MESSAGE FOR THE NURSE SELECT OPTION 2, PLEASE LEAVE A MESSAGE INCLUDING: . YOUR NAME . DATE OF BIRTH . CALL BACK NUMBER . REASON FOR CALL**this is important as we prioritize the call backs  YOU WILL RECEIVE A CALL BACK THE SAME DAY AS LONG AS YOU CALL BEFORE 4:00 PM

## 2020-11-30 ENCOUNTER — Encounter (HOSPITAL_COMMUNITY): Payer: 59 | Admitting: Internal Medicine

## 2020-12-02 ENCOUNTER — Emergency Department (HOSPITAL_COMMUNITY): Payer: 59

## 2020-12-02 ENCOUNTER — Encounter (HOSPITAL_COMMUNITY): Payer: Self-pay | Admitting: Emergency Medicine

## 2020-12-02 ENCOUNTER — Telehealth (HOSPITAL_COMMUNITY): Payer: Self-pay | Admitting: *Deleted

## 2020-12-02 ENCOUNTER — Emergency Department (HOSPITAL_COMMUNITY)
Admission: EM | Admit: 2020-12-02 | Discharge: 2020-12-02 | Disposition: A | Discharge status: Home / Self Care | Payer: 59 | Attending: Emergency Medicine | Admitting: Emergency Medicine

## 2020-12-02 DIAGNOSIS — I5021 Acute systolic (congestive) heart failure: Secondary | ICD-10-CM | POA: Insufficient documentation

## 2020-12-02 DIAGNOSIS — M549 Dorsalgia, unspecified: Secondary | ICD-10-CM

## 2020-12-02 DIAGNOSIS — Z79899 Other long term (current) drug therapy: Secondary | ICD-10-CM | POA: Diagnosis not present

## 2020-12-02 DIAGNOSIS — I252 Old myocardial infarction: Secondary | ICD-10-CM | POA: Insufficient documentation

## 2020-12-02 DIAGNOSIS — R079 Chest pain, unspecified: Secondary | ICD-10-CM | POA: Diagnosis present

## 2020-12-02 DIAGNOSIS — M546 Pain in thoracic spine: Secondary | ICD-10-CM | POA: Insufficient documentation

## 2020-12-02 LAB — CBC
HCT: 35.7 % — ABNORMAL LOW (ref 36.0–46.0)
Hemoglobin: 12.2 g/dL (ref 12.0–15.0)
MCH: 31.9 pg (ref 26.0–34.0)
MCHC: 34.2 g/dL (ref 30.0–36.0)
MCV: 93.5 fL (ref 80.0–100.0)
Platelets: 267 10*3/uL (ref 150–400)
RBC: 3.82 MIL/uL — ABNORMAL LOW (ref 3.87–5.11)
RDW: 12.5 % (ref 11.5–15.5)
WBC: 5 10*3/uL (ref 4.0–10.5)
nRBC: 0 % (ref 0.0–0.2)

## 2020-12-02 LAB — TROPONIN I (HIGH SENSITIVITY)
Troponin I (High Sensitivity): 11 ng/L (ref <18)
Troponin I (High Sensitivity): 19 ng/L — ABNORMAL HIGH (ref <18)

## 2020-12-02 LAB — BASIC METABOLIC PANEL
Anion gap: 9 (ref 5–15)
BUN: 6 mg/dL (ref 6–20)
CO2: 27 mmol/L (ref 22–32)
Calcium: 9.4 mg/dL (ref 8.9–10.3)
Chloride: 104 mmol/L (ref 98–111)
Creatinine, Ser: 0.75 mg/dL (ref 0.44–1.00)
GFR, Estimated: >60 mL/min (ref >60)
Glucose, Bld: 125 mg/dL — ABNORMAL HIGH (ref 70–99)
Potassium: 3.5 mmol/L (ref 3.5–5.1)
Sodium: 140 mmol/L (ref 135–145)

## 2020-12-02 NOTE — ED Provider Notes (Signed)
[facility] EMERGENCY DEPARTMENT Provider Note   CSN: [id number] Arrival date & time: 12/02/20  1730     History Chief Complaint  Patient presents with  . Chest Pain    Janet Ramirez is a 60 y.o. female.  HPI      Janet Ramirez is a 60 y.o. female, with a history of hyperlipidemia, nonischemic cardiomyopathy, palpitations, acute systolic CHF, NSTEMI, presenting to the ED with left upper back pain beginning this morning after waking. Pain was radiating into the left shoulder and left arm. She called her cardiology office and was told to come to the ED. Upon the time of my interview, patient's symptoms have resolved.  Denies fever/chills, cough, shortness of breath, abdominal pain, numbness, weakness, chest pain, exertional symptoms, or any other complaints.   Past Medical History:  Diagnosis Date  . Hyperlipidemia   . Hypothyroidism     Patient Active Problem List   Diagnosis Date Noted  . Palpitations 09/29/2020  . Non-ischemic cardiomyopathy (HCC) 09/29/2020  . Hyperlipemia 09/29/2020  . 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 Elevated troponin   . Acute systolic CHF (congestive heart failure) (HCC)   . NSTEMI (non-ST elevated myocardial infarction) (HCC) 09/26/2020  . Chest pain 09/26/2020    Past Surgical History:  Procedure Laterality Date  . ABDOMINAL HYSTERECTOMY  1980s  . RIGHT/LEFT HEART CATH AND CORONARY ANGIOGRAPHY N/A 09/28/2020   Procedure: RIGHT/LEFT HEART CATH AND CORONARY ANGIOGRAPHY;  Surgeon: [place], Peter M, MD;  Location: [facility] INVASIVE CV LAB;  Service: Cardiovascular;  Laterality: N/A;     OB History   No obstetric history on file.     Family History  Problem Relation Age of Onset  . Hypertension Mother   . Heart disease Father        s/p stent and pacemaker placed in his 65s  . Diabetes Mellitus II Father     Social History   Tobacco Use  . Smoking status: Never Smoker  . Smokeless tobacco: Never Used  Substance Use Topics  .  Alcohol use: Yes    Comment: socially  . Drug use: Never    Home Medications Prior to Admission medications   Medication Sig Start Date End Date Taking? Authorizing Provider  alum & mag hydroxide-simeth (MAALOX/MYLANTA) 200-200-20 MG/5ML suspension Take 30 mLs by mouth every 6 (six) hours as needed for indigestion or heartburn.    [provider]  Ascorbic Acid (VITAMIN C) 1000 MG tablet Take 1,000 mg by mouth daily. 1 Tablet Daily    [provider]  calcium carbonate (TUMS - DOSED IN MG ELEMENTAL CALCIUM) 500 MG chewable tablet Chew 1-2 tablets by mouth as needed for indigestion or heartburn.    [provider]  carvedilol (COREG) 3.125 MG tablet Take 1 tablet (3.125 mg total) by mouth 2 (two) times daily with a meal. 11/04/20   Little Ishikawa, MD  cholecalciferol (VITAMIN D3) 25 MCG (1000 UNIT) tablet Take 1,000 Units by mouth daily. 1 Tablet Daily    [provider]  mexiletine (MEXITIL) 200 MG capsule Take 1 capsule (200 mg total) by mouth 2 (two) times daily. 11/26/20   Bensimhon, Bevelyn Buckles, MD  sacubitril-valsartan (ENTRESTO) 24-26 MG Take 1 tablet by mouth 2 (two) times daily. 09/29/20   Manson Passey, PA  simethicone (MYLICON) 125 MG chewable tablet Chew 125 mg by mouth every 6 (six) hours as needed for flatulence.    [provider]  zinc gluconate 50 MG tablet Take 25 mg by  mouth daily.    [provider]    Allergies    Codeine and Sulfacetamide sodium  Review of Systems   Review of Systems  Constitutional: Negative for chills, diaphoresis and fever.  Respiratory: Negative for cough and shortness of breath.   Cardiovascular: Negative for chest pain.  Gastrointestinal: Negative for abdominal pain, diarrhea, nausea and vomiting.  Musculoskeletal: Positive for back pain.  Neurological: Negative for dizziness, syncope, weakness and numbness.    Physical Exam Updated Vital Signs BP 124/72 (BP Location: Right  Arm)   Pulse 61   Temp 98 F (36.7 C) (Oral)   Resp 13   SpO2 98%   Physical Exam Vitals and nursing note reviewed.  Constitutional:      General: She is not in acute distress.    Appearance: She is well-developed. She is not diaphoretic.  HENT:     Head: Normocephalic and atraumatic.     Mouth/Throat:     Mouth: Mucous membranes are moist.     Pharynx: Oropharynx is clear.  Eyes:     Conjunctiva/sclera: Conjunctivae normal.  Cardiovascular:     Rate and Rhythm: Normal rate and regular rhythm.     Pulses: Normal pulses.          Radial pulses are 2+ on the right side and 2+ on the left side.       Posterior tibial pulses are 2+ on the right side and 2+ on the left side.     Heart sounds: Normal heart sounds.     Comments: Tactile temperature in the extremities appropriate and equal bilaterally. Pulmonary:     Effort: Pulmonary effort is normal. No respiratory distress.     Breath sounds: Normal breath sounds.  Abdominal:     Palpations: Abdomen is soft.     Tenderness: There is no abdominal tenderness. There is no guarding.  Musculoskeletal:     Cervical back: Neck supple.     Right lower leg: No edema.     Left lower leg: No edema.     Comments: Some tenderness into the left trapezius without noted swelling, color abnormality, deformity. Full range of motion the left shoulder without difficulty or pain.  Skin:    General: Skin is warm and dry.  Neurological:     Mental Status: She is alert.     Comments: No noted acute cognitive deficit. Sensation grossly intact to light touch in the extremities.   Grip strengths equal bilaterally.   Strength 5/5 in all extremities.  Coordination intact.  Cranial nerves III-XII grossly intact.  Handles oral secretions without noted difficulty.  No noted phonation or speech deficit. No facial droop.   Psychiatric:        Mood and Affect: Mood and affect normal.        Speech: Speech normal.        Behavior: Behavior normal.      ED Results / Procedures / Treatments   Labs (all labs ordered are listed, but only abnormal results are displayed) Labs Reviewed  BASIC METABOLIC PANEL - Abnormal; Notable for the following components:      Result Value   Glucose, Bld 125 (*)    All other components within normal limits  CBC - Abnormal; Notable for the following components:   RBC 3.82 (*)    HCT 35.7 (*)    All other components within normal limits  TROPONIN I (HIGH SENSITIVITY) - Abnormal; Notable for the following components:   Troponin I (High  Sensitivity) 19 (*)    All other components within normal limits  TROPONIN I (HIGH SENSITIVITY)    EKG EKG Interpretation  Date/Time:  Wednesday December 02 2020 17:39:24 EST Ventricular Rate:  79 PR Interval:  138 QRS Duration: 90 QT Interval:  378 QTC Calculation: 433 R Axis:   66 Text Interpretation: Sinus rhythm with marked sinus arrhythmia with frequent Premature ventricular complexes Septal infarct , age undetermined ST & T wave abnormality, consider inferior ischemia Abnormal ECG no sig change from previous Confirmed by Arby Barrette [phone]) on 12/02/2020 8:16:31 PM   Radiology DG Chest 2 View  Result Date: 12/02/2020 CLINICAL DATA:  Chest pain EXAM: CHEST - 2 VIEW COMPARISON:  10/18/2020 FINDINGS: Borderline to mild cardiomegaly. No focal opacity or pleural effusion. No pneumothorax. IMPRESSION: Borderline to mild cardiomegaly. Electronically Signed   By: Jasmine Pang M.D.   On: 12/02/2020 18:13    Procedures Procedures   Medications Ordered in ED Medications - No data to display  ED Course  I have reviewed the triage vital signs and the nursing notes.  Pertinent labs & imaging results that were available during my care of the patient were reviewed by me and considered in my medical decision making (see chart for details).    MDM Rules/Calculators/A&P                          Patient presents with left upper back pain that resolves prior to my  evaluation of the patient. Patient is nontoxic appearing, afebrile, not tachycardic, not tachypneic, not hypotensive, maintains excellent SPO2 on room air, and is in no apparent distress.   I have reviewed the patient's chart to obtain more information.   I reviewed and interpreted the patient's labs and radiological studies. Low suspicion for ACS. No high risk/suspicious features; no exertional chest pain, vomiting, diaphoresis. HEART score is 3, indicating low risk for a cardiac event.  EKG without evidence of acute ischemia or pathologic/symptomatic arrhythmia. Wells criteria score is 0, indicating low risk for PE.   Dissection was considered, but thought less likely base on: History and description of the pain are not suggestive, patient is not ill-appearing, lack of risk factors, equal bilateral pulses, lack of neurologic deficits, no widened mediastinum on chest x-ray. Return precautions discussed.  Patient voices understanding of these instructions, accepts the plan, and is comfortable with discharge.     Findings and plan of care discussed with attending physician, Arby Barrette, MD.    Vitals:   12/02/20 2046 12/02/20 2210 12/02/20 2225 12/02/20 2233  BP: 116/68 131/66 124/72   Pulse: 71 74 61   Resp: 16 17 13    Temp:   98 F (36.7 C) 98 F (36.7 C)  TempSrc:   Oral Oral  SpO2: 97% 97% 98%      Final Clinical Impression(s) / ED Diagnoses Final diagnoses:  Upper back pain on left side    Rx / DC Orders ED Discharge Orders    None       12/02/20 2244    02/01/21, MD 12/03/20 [phone]

## 2020-12-02 NOTE — ED Triage Notes (Signed)
Patient complains of left shoulder pain radiating into left arm that started at 0700 this morning. Patient states she called cardiologist who sent patient to ED. Patient alert, oriented, and in no apparent distress at this time.

## 2020-12-02 NOTE — Discharge Instructions (Signed)
Follow-up with your primary care provider and cardiologist for any further assessment or management.

## 2020-12-02 NOTE — Telephone Encounter (Signed)
Pt called c/o left arm pain radiating to middle of upper back. Pt said her bp is 150/74. Pt denies shortness of breath. No other complaints but feels she needs to be evaluated. Pt call received after 4pm. Pt advised to go to the emergency room for evaluation.

## 2020-12-04 ENCOUNTER — Encounter (HOSPITAL_COMMUNITY): Payer: Self-pay

## 2020-12-04 NOTE — Telephone Encounter (Signed)
Pt returned call and is scheduled to see APP Tuesday.

## 2020-12-04 NOTE — Telephone Encounter (Signed)
Called pt to offer APP visit next week. No answer/left vm requesting return call.

## 2020-12-07 NOTE — Progress Notes (Signed)
ADVANCED HF CLINIC CONSULT NOTE  Referring Physician: Dr. Bjorn Pippin Primary Care: Mitzi Hansen, NP Primary Cardiologist: Little Ishikawa, MD  HPI: Janet Ramirez is a 60 yo woman with hypothyroidism, HL, PVCs,  and systolic HF .  5 years ago she was placed on levothyroxine but stopped after a few years.   No known h/o of heart problems until last year. Admitted 12/21 with CP and mildly elevated hstrop. Echo 12/21 EF 25-30%, severe LV dilatation, normal RV function, mild to moderate MR, mild AI.  Cath showed normal coronary arteries with well-compensated hemodynamics, 3-day Zio showed one 8 beat run of NSVT, and 21 runs of SVT with longest lasting 18 beats, frequent PVCs (6.1%).  cMRI 11/13/20: 1. LVEF 35% 2.  Normal RV size and systolic function (EF 58%) 3. Basal septal midwall LGE and RV insertion LGE, which is a scar pattern seen in nonischemic cardiomyopathies and associated with worse prognosis 4.  Mild aortic regurgitation, regurgitant fraction 9%  28 day event monitor 1/22:  PVCs 2%  HF meds limited by low BP.   On 11/26/20 she had initial consultation with Dr Gala Romney for heart failure. EKG with high PVC burden. Started on mexilitene 200 mg twice a day. She started this on 2/28.  On 3/2 she woke up and had left upper back pain after walking with pain radiating it left shoulder. She presented to ED and at that time her symptoms had resolved. HS tropon 11>19. She has not had mexilitene since that time.   Today she returns for HF follow up. As noted above on 2/24  mexilitene was ordered for high PVC burden. Having episodes of light headness. No syncope.  Today she is feeling ok. Having some indigestion after eating.   Denies SOB/PND/Orthopnea. Acitve around the house. Appetite ok. No fever or chills. Weight at home 121 pounds. Taking all medications.  SBP at home 110. Usually stays >100.     Past Medical History:  Diagnosis Date  . Hyperlipidemia   . Hypothyroidism      Current Outpatient Medications  Medication Sig Dispense Refill  . alum & mag hydroxide-simeth (MAALOX/MYLANTA) 200-200-20 MG/5ML suspension Take 30 mLs by mouth every 6 (six) hours as needed for indigestion or heartburn.    . Ascorbic Acid (VITAMIN C) 1000 MG tablet Take 1,000 mg by mouth daily. 1 Tablet Daily    . calcium carbonate (TUMS - DOSED IN MG ELEMENTAL CALCIUM) 500 MG chewable tablet Chew 1-2 tablets by mouth as needed for indigestion or heartburn.    . carvedilol (COREG) 3.125 MG tablet Take 1 tablet (3.125 mg total) by mouth 2 (two) times daily with a meal. 180 tablet 3  . cholecalciferol (VITAMIN D3) 25 MCG (1000 UNIT) tablet Take 1,000 Units by mouth daily. 1 Tablet Daily    . sacubitril-valsartan (ENTRESTO) 24-26 MG Take 1 tablet by mouth 2 (two) times daily. 60 tablet 6  . simethicone (MYLICON) 125 MG chewable tablet Chew 125 mg by mouth every 6 (six) hours as needed for flatulence.    . zinc gluconate 50 MG tablet Take 25 mg by mouth daily.    Marland Kitchen mexiletine (MEXITIL) 200 MG capsule Take 1 capsule (200 mg total) by mouth 2 (two) times daily. (Patient not taking: Reported on 12/08/2020) 60 capsule 3   No current facility-administered medications for this encounter.    Allergies  Allergen Reactions  . Codeine Itching  . Sulfacetamide Sodium Diarrhea      Social History   Socioeconomic History  .  Marital status: Married    Spouse name: Not on file  . Number of children: Not on file  . Years of education: Not on file  . Highest education level: Not on file  Occupational History  . Not on file  Tobacco Use  . Smoking status: Never Smoker  . Smokeless tobacco: Never Used  Substance and Sexual Activity  . Alcohol use: Yes    Comment: socially  . Drug use: Never  . Sexual activity: Yes    Partners: Male  Other Topics Concern  . Not on file  Social History Narrative  . Not on file   Social Determinants of Health   Financial Resource Strain: Not on file  Food  Insecurity: Not on file  Transportation Needs: Not on file  Physical Activity: Not on file  Stress: Not on file  Social Connections: Not on file  Intimate Partner Violence: Not on file      Family History  Problem Relation Age of Onset  . Hypertension Mother   . Heart disease Father        s/p stent and pacemaker placed in his 72s  . Diabetes Mellitus II Father     Vitals:   12/08/20 1038  BP: (!) 144/92  Pulse: 66  SpO2: 99%  Weight: 56.1 kg (123 lb 9.6 oz)    PHYSICAL EXAM: General:  Well appearing. No resp difficulty HEENT: normal Neck: supple. no JVD. Carotids 2+ bilat; no bruits. No lymphadenopathy or thryomegaly appreciated. Cor: PMI nondisplaced. Regular rate & rhythm. No rubs, gallops or murmurs. Lungs: clear Abdomen: soft, nontender, nondistended. No hepatosplenomegaly. No bruits or masses. Good bowel sounds. Extremities: no cyanosis, clubbing, rash, edema Neuro: alert & orientedx3, cranial nerves grossly intact. moves all 4 extremities w/o difficulty. Affect pleasant    ASSESSMENT & PLAN:  1. 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 - Echo 12/21 EF 25 to 30%, severe LV dilatation, normal RV function, mild to moderate MR, mild AI.   - Cath 12/21 normal coronary arteries - Zio 09/2020 showed an 8 beat run of NSVT, and 21 runs of SVT with longest lasting 18 beats, frequent PVCs (6.1% with 3 morphologies. Highest daily burden 8.7%) - 28 day event monitor 1/22:  PVCs 2% - cMRI cMRI 11/13/20: LVEF 35% RVEF 58%. Basal septal midwall LGE and RV insertion LGE - Etiology unclear. Possible PVC CM but PVC burden < 10% on Zio and 2% on recent event montior -NYHA II. Volume status stable.  - Continue carvedilol 3.125 bid (recently decreased due to low BP) - Continue Entresto 24-26 mg twice daily.  Unable to up titrate with soft BP noted at home.  - Off spiro due to hypotension - Off mexilitene due to left upper back discomfort reported on 3/2 . - Plan to  repeat ECHO in 4 weeks with Dr Gala Romney.  -  2. Frequent PVCs:  - Zio patch x 3 days on 09/29/2020 showed an 8 beat run of NSVT, and 21 runs of SVT with longest lasting 18 beats, frequent PVCs (6.1% with 3 morphologies. Highest daily burden 8.7%) - 28 day event monitor 1/22:  PVCs 2% - Intolerant mexilitene after a couple of doses.  - Add 100 mg amio daily.  - Discussed routine monitoring associated with amiodarone.   3. Hypothyroidism TSH normal 09/26/20   Follow up next month with an ECHO and Dr Gala Romney. Discussed medication changes and plan for next visit.  Tonye Becket, NP  11:08 AM

## 2020-12-08 ENCOUNTER — Ambulatory Visit (HOSPITAL_COMMUNITY)
Admission: RE | Admit: 2020-12-08 | Discharge: 2020-12-08 | Disposition: A | Discharge status: Home / Self Care | Payer: 59 | Source: Ambulatory Visit | Attending: Adult Health | Admitting: Adult Health

## 2020-12-08 ENCOUNTER — Encounter (HOSPITAL_COMMUNITY): Payer: Self-pay

## 2020-12-08 ENCOUNTER — Other Ambulatory Visit: Payer: Self-pay

## 2020-12-08 VITALS — BP 144/92 | HR 66 | Wt 123.6 lb

## 2020-12-08 DIAGNOSIS — I5022 Chronic systolic (congestive) heart failure: Secondary | ICD-10-CM | POA: Diagnosis not present

## 2020-12-08 DIAGNOSIS — I428 Other cardiomyopathies: Secondary | ICD-10-CM | POA: Diagnosis not present

## 2020-12-08 DIAGNOSIS — I493 Ventricular premature depolarization: Secondary | ICD-10-CM | POA: Diagnosis not present

## 2020-12-08 DIAGNOSIS — Z8249 Family history of ischemic heart disease and other diseases of the circulatory system: Secondary | ICD-10-CM | POA: Insufficient documentation

## 2020-12-08 DIAGNOSIS — E785 Hyperlipidemia, unspecified: Secondary | ICD-10-CM | POA: Insufficient documentation

## 2020-12-08 DIAGNOSIS — E039 Hypothyroidism, unspecified: Secondary | ICD-10-CM | POA: Diagnosis not present

## 2020-12-08 DIAGNOSIS — Z79899 Other long term (current) drug therapy: Secondary | ICD-10-CM | POA: Diagnosis not present

## 2020-12-08 DIAGNOSIS — I5042 Chronic combined systolic (congestive) and diastolic (congestive) heart failure: Secondary | ICD-10-CM | POA: Insufficient documentation

## 2020-12-08 MED ORDER — AMIODARONE HCL 200 MG PO TABS: 100.0000 mg | ORAL_TABLET | Freq: Every day | ORAL | 3 refills | Status: DC

## 2020-12-08 NOTE — Patient Instructions (Signed)
Start Amiodarone 100 mg (1/2 tab) daily at night  Your physician has requested that you have an echocardiogram. Echocardiography is a painless test that uses sound waves to create images of your heart. It provides your doctor with information about the size and shape of your heart and how well your heart's chambers and valves are working. This procedure takes approximately one hour. There are no restrictions for this procedure.  Your physician recommends that you schedule a follow-up appointment in: 1 month with echocardiogram  If you have any questions or concerns before your next appointment please send [place] a message through [place] or call our office at [phone].    TO LEAVE A MESSAGE FOR THE NURSE SELECT OPTION 2, PLEASE LEAVE A MESSAGE INCLUDING: . YOUR NAME . DATE OF BIRTH . CALL BACK NUMBER . REASON FOR CALL**this is important as we prioritize the call backs  YOU WILL RECEIVE A CALL BACK THE SAME DAY AS LONG AS YOU CALL BEFORE 4:00 PM  At the [facility], you and your health needs are our priority. As part of our continuing mission to provide you with exceptional heart care, we have created designated Provider Care Teams. These Care Teams include your primary Cardiologist (physician) and Advanced Practice Providers (APPs- Physician Assistants and Nurse Practitioners) who all work together to provide you with the care you need, when you need it.   You may see any of the following providers on your designated Care Team at your next follow up: Marland Kitchen Dr Arvilla Meres . Dr Marca Ancona . Dr Thornell Mule . Tonye Becket, NP . Robbie Lis, PA . Shanda Bumps [place],NP . Karle Plumber, PharmD   Please be sure to bring in all your medications bottles to every appointment.

## 2020-12-10 ENCOUNTER — Telehealth (HOSPITAL_COMMUNITY): Payer: Self-pay | Admitting: Adult Health

## 2020-12-10 ENCOUNTER — Encounter (HOSPITAL_COMMUNITY): Payer: Self-pay

## 2020-12-10 DIAGNOSIS — I493 Ventricular premature depolarization: Secondary | ICD-10-CM

## 2020-12-10 NOTE — Telephone Encounter (Signed)
Pt aware and agreeable with plan. Referral placed.  

## 2020-12-10 NOTE — Telephone Encounter (Signed)
Called pt and went over advice from Amy Clegg,NP-c. Pt thanked me for the call and verbalized understanding. Referral to EP placed.

## 2020-12-10 NOTE — Telephone Encounter (Signed)
Please call.  I understand her reluctance to start amiodarone.   I discussed her case with Dr Gala Romney. Given possible difficulty with Mexiletine we will not restart.   Dr Gala Romney recommends referral to EP for PVCs. We are concerned PVCs may be the result of reduced EF.   Please call.  Curlie Macken NP-C  12:16 PM

## 2020-12-11 ENCOUNTER — Ambulatory Visit: Payer: 59 | Admitting: Cardiology

## 2020-12-21 ENCOUNTER — Other Ambulatory Visit: Payer: Self-pay

## 2020-12-21 ENCOUNTER — Encounter: Payer: Self-pay | Admitting: Internal Medicine

## 2020-12-21 ENCOUNTER — Ambulatory Visit (INDEPENDENT_AMBULATORY_CARE_PROVIDER_SITE_OTHER): Payer: 59 | Admitting: Internal Medicine

## 2020-12-21 VITALS — BP 140/78 | HR 61 | Ht 64.0 in | Wt 124.0 lb

## 2020-12-21 DIAGNOSIS — I493 Ventricular premature depolarization: Secondary | ICD-10-CM

## 2020-12-21 DIAGNOSIS — I428 Other cardiomyopathies: Secondary | ICD-10-CM

## 2020-12-21 DIAGNOSIS — I519 Heart disease, unspecified: Secondary | ICD-10-CM

## 2020-12-21 NOTE — Progress Notes (Signed)
Electrophysiology Office Note   Date:  12/21/2020   ID:  Janet Ramirez, DOB [birth year], MRN [id number]  PCP:  Janet Hansen, NP  Cardiologist:  Dr Janet Ramirez Primary Electrophysiologist: Janet Range, MD    CC: PVCs   History of Present Illness: [name] is a 60 y.o. female who presents today for electrophysiology evaluation.   She is referred by Dr Janet Ramirez for EP consultation regarding PVCs. She has chronic systolic dysfunction. PVC burden by Zio of 6% with rare and short NSVT and SVT. She was started on mexiletine by Dr Janet Ramirez but only took this briefly. She has been started on amiodarone but did not take this medicine due to concern of side effects.   She reports that her palpitations have substantially improved.  She does not think that she is having very frequent ectopy at this time.  Today, she denies symptoms of chest pain,   lower extremity edema, claudication, dizziness, presyncope, syncope, bleeding, or neurologic sequela.  + occasional SOB, more prominent when bending over.  The patient is tolerating medications without difficulties and is otherwise without complaint today.    Past Medical History:  Diagnosis Date  . Hyperlipidemia   . Hypothyroidism    Past Surgical History:  Procedure Laterality Date  . ABDOMINAL HYSTERECTOMY  1980s  . RIGHT/LEFT HEART CATH AND CORONARY ANGIOGRAPHY N/A 09/28/2020   Procedure: RIGHT/LEFT HEART CATH AND CORONARY ANGIOGRAPHY;  Surgeon: [place], Peter M, MD;  Location: [facility] INVASIVE CV LAB;  Service: Cardiovascular;  Laterality: N/A;     Current Outpatient Medications  Medication Sig Dispense Refill  . alum & mag hydroxide-simeth (MAALOX/MYLANTA) 200-200-20 MG/5ML suspension Take 30 mLs by mouth every 6 (six) hours as needed for indigestion or heartburn.    . Ascorbic Acid (VITAMIN C) 1000 MG tablet Take 1,000 mg by mouth daily. 1 Tablet Daily    . calcium carbonate (TUMS - DOSED IN MG ELEMENTAL CALCIUM) 500 MG chewable tablet Chew  1-2 tablets by mouth as needed for indigestion or heartburn.    . carvedilol (COREG) 3.125 MG tablet Take 1 tablet (3.125 mg total) by mouth 2 (two) times daily with a meal. 180 tablet 3  . cholecalciferol (VITAMIN D3) 25 MCG (1000 UNIT) tablet Take 1,000 Units by mouth daily. 1 Tablet Daily    . sacubitril-valsartan (ENTRESTO) 24-26 MG Take 1 tablet by mouth 2 (two) times daily. 60 tablet 6  . simethicone (MYLICON) 125 MG chewable tablet Chew 125 mg by mouth every 6 (six) hours as needed for flatulence.    . zinc gluconate 50 MG tablet Take 25 mg by mouth daily.     No current facility-administered medications for this visit.    Allergies:   Codeine and Sulfacetamide sodium   Social History:  The patient  reports that she has never smoked. She has never used smokeless tobacco. She reports current alcohol use. She reports that she does not use drugs.   Family History:  The patient's  family history includes Diabetes Mellitus II in her father; Heart disease in her father; Hypertension in her mother.    ROS:  Please see the history of present illness.   All other systems are personally reviewed and negative.    PHYSICAL EXAM: VS:  There were no vitals taken for this visit. , BMI There is no height or weight on file to calculate BMI. GEN: Well nourished, well developed, in no acute distress HEENT: normal Neck: no JVD, carotid bruits, or masses Cardiac: RRR;  Very rare  ectopy on exam today (2 PVCs in 1 minute) no murmurs, rubs, or gallops,no edema  Respiratory:  clear to auscultation bilaterally, normal work of breathing GI: soft, nontender, nondistended, + BS MS: no deformity or atrophy Skin: warm and dry  Neuro:  Strength and sensation are intact Psych: euthymic mood, full affect  EKG:  EKG is ordered today. The ekg ordered today is personally reviewed and shows sinus rhythm   Recent Labs: 09/26/2020: ALT 19; B Natriuretic Peptide 608.1; Magnesium 2.2; TSH 2.261 12/02/2020: BUN 6;  Creatinine, Ser 0.75; Hemoglobin 12.2; Platelets 267; Potassium 3.5; Sodium 140  personally reviewed   Lipid Panel     Component Value Date/Time   CHOL 189 09/27/2020 0501   TRIG 45 09/27/2020 0501   HDL 76 09/27/2020 0501   CHOLHDL 2.5 09/27/2020 0501   VLDL 9 09/27/2020 0501   LDLCALC 104 (H) 09/27/2020 0501   personally reviewed   Wt Readings from Last 3 Encounters:  12/08/20 123 lb 9.6 oz (56.1 kg)  11/26/20 125 lb 9.6 oz (57 kg)  11/04/20 126 lb 9.6 oz (57.4 kg)      Other studies personally reviewed: Additional studies/ records that were reviewed today include: prior CHF clinic notes, ekgs, echo  Review of the above records today demonstrates: as above   ASSESSMENT AND PLAN:  1.  PVCs Very rare on exam today.  Reduced on recent ekgs when compared to December.  I do not think that this is the likely cause of her CM.  She has at least 2 morphologies on prior ecgs, both with RBB morphology suggesting LV source.  Relatively low burden on prior monitors (<10%).  These are likely secondary to her CM rather than the primary cause. She seems to have improved with medical management of her CM.  I would advise ongoing titration of CM medicines.  I would not advise AAD or ablation at this time.  2. Nonischemic CM Clinically improving with CHF medicines She will continue medicine optimization with the CHF clinic.  Could eventually consider ICD if her EF remains <35% once medicines have been fully optimized for 3 months.   Risks, benefits and potential toxicities for medications prescribed and/or refilled reviewed with patient today.    Follow-up:  Return as needed    Signed, Janet Range, MD  12/21/2020 11:44 AM     [facility] [address] [place] [address] [phone] (office) [phone] (fax)

## 2020-12-21 NOTE — Patient Instructions (Signed)
Medication Instructions:  Your physician recommends that you continue on your current medications as directed. Please refer to the Current Medication list given to you today.  Labwork: None ordered.  Testing/Procedures: None ordered.  Follow-Up: Your physician wants you to follow-up in: as needed with James Allred, MD   Any Other Special Instructions Will Be Listed Below (If Applicable).  If you need a refill on your cardiac medications before your next appointment, please call your pharmacy.         

## 2021-01-07 ENCOUNTER — Encounter (HOSPITAL_COMMUNITY): Payer: 59 | Admitting: Internal Medicine

## 2021-01-07 ENCOUNTER — Encounter (HOSPITAL_COMMUNITY): Payer: Self-pay | Admitting: Internal Medicine

## 2021-01-07 ENCOUNTER — Other Ambulatory Visit (HOSPITAL_COMMUNITY): Payer: Self-pay

## 2021-01-07 ENCOUNTER — Other Ambulatory Visit: Payer: Self-pay

## 2021-01-07 ENCOUNTER — Ambulatory Visit (HOSPITAL_COMMUNITY)
Admission: RE | Admit: 2021-01-07 | Discharge: 2021-01-07 | Disposition: A | Discharge status: Home / Self Care | Payer: 59 | Source: Ambulatory Visit | Attending: Internal Medicine | Admitting: Internal Medicine

## 2021-01-07 ENCOUNTER — Ambulatory Visit (HOSPITAL_BASED_OUTPATIENT_CLINIC_OR_DEPARTMENT_OTHER)
Admission: RE | Admit: 2021-01-07 | Discharge: 2021-01-07 | Disposition: A | Discharge status: Home / Self Care | Payer: 59 | Source: Ambulatory Visit | Attending: Internal Medicine | Admitting: Internal Medicine

## 2021-01-07 VITALS — BP 124/70 | HR 60 | Wt 121.4 lb

## 2021-01-07 DIAGNOSIS — E785 Hyperlipidemia, unspecified: Secondary | ICD-10-CM | POA: Diagnosis not present

## 2021-01-07 DIAGNOSIS — I509 Heart failure, unspecified: Secondary | ICD-10-CM | POA: Insufficient documentation

## 2021-01-07 DIAGNOSIS — I428 Other cardiomyopathies: Secondary | ICD-10-CM

## 2021-01-07 DIAGNOSIS — I071 Rheumatic tricuspid insufficiency: Secondary | ICD-10-CM | POA: Diagnosis not present

## 2021-01-07 DIAGNOSIS — I493 Ventricular premature depolarization: Secondary | ICD-10-CM

## 2021-01-07 DIAGNOSIS — I5022 Chronic systolic (congestive) heart failure: Secondary | ICD-10-CM | POA: Diagnosis not present

## 2021-01-07 HISTORY — DX: Heart failure, unspecified: I50.9

## 2021-01-07 LAB — ECHOCARDIOGRAM COMPLETE
Area-P 1/2: 2.87 cm2
P 1/2 time: 805 msec
S' Lateral: 5.1 cm

## 2021-01-07 MED ORDER — DAPAGLIFLOZIN PROPANEDIOL 5 MG PO TABS
5.0000 mg | ORAL_TABLET | Freq: Every day | ORAL | 3 refills | Status: DC
Start: 2021-01-07 — End: 2021-03-10

## 2021-01-07 NOTE — Progress Notes (Signed)
ADVANCED HF CLINIC CONSULT NOTE  Referring Physician: Dr. Bjorn Pippin Primary Care: Mitzi Hansen, NP Primary Cardiologist: Little Ishikawa, MD  HPI: Janet Ramirez is a 60 yo woman with hypothyroidism, HL, PVCs,  and systolic HF .  5 years ago she was placed on levothyroxine but stopped after a few years.   No known h/o of heart problems until last year. Admitted 12/21 with CP and mildly elevated hstrop. Echo 12/21 EF 25-30%, severe LV dilatation, normal RV function, mild to moderate MR, mild AI.  Cath showed normal coronary arteries with well-compensated hemodynamics, 3-day Zio showed one 8 beat run of NSVT, and 21 runs of SVT with longest lasting 18 beats, frequent PVCs (6.1%).  cMRI 11/13/20: 1. LVEF 35% 2.  Normal RV size and systolic function (EF 58%) 3. Basal septal midwall LGE and RV insertion LGE, which is a scar pattern seen in nonischemic cardiomyopathies and associated with worse prognosis 4.  Mild aortic regurgitation, regurgitant fraction 9%  28 day event monitor 1/22:  PVCs 2%  I saw her for the first time on 11/26/20  EKG with high PVC burden. 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 Developed CP so stopped. Saw Amy Clegg and we discussed and decided on short trial of amio but patient refused due to potential SEs.   She saw Dr. Johney Frame in 3/22 who felt PVCs the result of her CM and not cause and did not think she required AAD or ablation (PVCs felt to be in LV)  Today she returns for HF follow up. Says she is feeling ok. Has a lot of good day where she she feels great. About once a week she has a day where she is completly wiped out. Feels like it might be related to what she eats but also notes more palpitations on this days. Very active. No LE edema, orthopnea or PND. + bendopnea. Says she walks better than her husband. Rare dizziness. BP at home 90/60s for the most part.    Echo today 01/07/21: EF 30-35% RV normal mild AI  Personally reviewed   SBP at home  110. Usually stays >100.     Past Medical History:  Diagnosis Date  . CHF (congestive heart failure) (HCC)   . Hyperlipidemia   . Hypothyroidism     Current Outpatient Medications  Medication Sig Dispense Refill  . alum & mag hydroxide-simeth (MAALOX/MYLANTA) 200-200-20 MG/5ML suspension Take 30 mLs by mouth every 6 (six) hours as needed for indigestion or heartburn.    . calcium carbonate (TUMS - DOSED IN MG ELEMENTAL CALCIUM) 500 MG chewable tablet Chew 1-2 tablets by mouth as needed for indigestion or heartburn.    . carvedilol (COREG) 3.125 MG tablet Take 1 tablet (3.125 mg total) by mouth 2 (two) times daily with a meal. 180 tablet 3  . sacubitril-valsartan (ENTRESTO) 24-26 MG Take 1 tablet by mouth 2 (two) times daily. 60 tablet 6  . simethicone (MYLICON) 125 MG chewable tablet Chew 125 mg by mouth every 6 (six) hours as needed for flatulence.     No current facility-administered medications for this encounter.    Allergies  Allergen Reactions  . Codeine Itching  . Sulfacetamide Sodium Diarrhea      Social History   Socioeconomic History  . Marital status: Married    Spouse name: Not on file  . Number of children: Not on file  . Years of education: Not on file  . Highest education level: Not on file  Occupational History  . Not on file  Tobacco Use  . Smoking status: Never Smoker  . Smokeless tobacco: Never Used  Substance and Sexual Activity  . Alcohol use: Yes    Comment: socially  . Drug use: Never  . Sexual activity: Yes    Partners: Male  Other Topics Concern  . Not on file  Social History Narrative  . Not on file   Social Determinants of Health   Financial Resource Strain: Not on file  Food Insecurity: Not on file  Transportation Needs: Not on file  Physical Activity: Not on file  Stress: Not on file  Social Connections: Not on file  Intimate Partner Violence: Not on file      Family History  Problem Relation Age of Onset  . Hypertension  Mother   . Heart disease Father        s/p stent and pacemaker placed in his 60s  . Diabetes Mellitus II Father     Vitals:   01/07/21 0900  BP: 124/70  Pulse: 60  SpO2: 100%  Weight: 55.1 kg (121 lb 6.4 oz)    PHYSICAL EXAM: General:  Well appearing. No resp difficulty HEENT: normal Neck: supple. no JVD. Carotids 2+ bilat; no bruits. No lymphadenopathy or thryomegaly appreciated. Cor: PMI nondisplaced. Regular rate & rhythm. No rubs, gallops or murmurs. Lungs: clear Abdomen: soft, nontender, nondistended. No hepatosplenomegaly. No bruits or masses. Good bowel sounds. Extremities: no cyanosis, clubbing, rash, edema Neuro: alert & orientedx3, cranial nerves grossly intact. moves all 4 extremities w/o difficulty. Affect pleasant   ASSESSMENT & PLAN:  1. 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 - Echo 12/21 EF 25 to 30%, severe LV dilatation, normal RV function, mild to moderate MR, mild AI.   - Cath 12/21 normal coronary arteries - Zio 09/2020 showed an 8 beat run of NSVT, and 21 runs of SVT with longest lasting 18 beats, frequent PVCs (6.1% with 3 morphologies. Highest daily burden 8.7%) - 28 day event monitor 1/22:  PVCs 2% - cMRI 11/13/20: LVEF 35% RVEF 58%. Basal septal midwall LGE and RV insertion LGE - Echo today 01/07/21: EF 30-35% RV normal mild AI  Personally reviewed - Etiology unclear. 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 Has seen Dr. Johney Frame who felt PVC s result of CM and not cause - Feeling better. NYHA II.  - Continue carvedilol 3.125 bid (recently decreased due to low BP) - Continue Entresto 24-26 mg twice daily.  Unable to up titrate with soft BP noted at home.  - Off spiro due to hypotension - Add Farxiga 5mg  daily - Will continue to follow for LV recovery.   2. Frequent PVCs:  - Zio patch x 3 days on 09/29/2020 showed an 8 beat run of NSVT, and 21 runs of SVT with longest lasting 18 beats, frequent PVCs  (6.1% with 3 morphologies. Highest daily burden 8.7%) - 28 day event monitor 1/22:  PVCs 2% - Intolerant mexilitene after a couple of doses due to CP. Refused amio - Saw Dr. 2/22 in 3/22 who felt PVCs the result of her CM and not cause and did not think she required AAD or ablation (PVCs felt to be in LV) - Continue to monitor  3. Hypothyroidism - TSH normal 09/26/20   09/28/20, MD  9:21 AM

## 2021-01-07 NOTE — Progress Notes (Signed)
  Echocardiogram 2D Echocardiogram has been performed.  Janet Ramirez 01/07/2021, 8:44 AM

## 2021-01-07 NOTE — Patient Instructions (Signed)
Your physician recommends that you schedule a follow-up appointment in: 2 months  Start Farxiga  5 mg Daily  If you have any questions or concerns before your next appointment please send [place] a message through [place] or call our office at [phone].    TO LEAVE A MESSAGE FOR THE NURSE SELECT OPTION 2, PLEASE LEAVE A MESSAGE INCLUDING: . YOUR NAME . DATE OF BIRTH . CALL BACK NUMBER . REASON FOR CALL**this is important as we prioritize the call backs  YOU WILL RECEIVE A CALL BACK THE SAME DAY AS LONG AS YOU CALL BEFORE 4:00 PM  At the [facility], you and your health needs are our priority. As part of our continuing mission to provide you with exceptional heart care, we have created designated Provider Care Teams. These Care Teams include your primary Cardiologist (physician) and Advanced Practice Providers (APPs- Physician Assistants and Nurse Practitioners) who all work together to provide you with the care you need, when you need it.   You may see any of the following providers on your designated Care Team at your next follow up: Marland Kitchen Dr Arvilla Meres . Dr Marca Ancona . Dr Thornell Mule . Tonye Becket, NP . Robbie Lis, PA . Shanda Bumps [place],NP . Karle Plumber, PharmD   Please be sure to bring in all your medications bottles to every appointment.

## 2021-02-22 ENCOUNTER — Encounter (HOSPITAL_COMMUNITY): Payer: Self-pay

## 2021-02-26 ENCOUNTER — Encounter (HOSPITAL_COMMUNITY): Payer: Self-pay

## 2021-03-08 NOTE — Telephone Encounter (Signed)
If SBPs still low <100, can stop Coreg and add back [place].

## 2021-03-10 ENCOUNTER — Ambulatory Visit (HOSPITAL_COMMUNITY)
Admission: RE | Admit: 2021-03-10 | Discharge: 2021-03-10 | Disposition: A | Discharge status: Home / Self Care | Payer: 59 | Source: Ambulatory Visit | Attending: Internal Medicine | Admitting: Internal Medicine

## 2021-03-10 ENCOUNTER — Other Ambulatory Visit: Payer: Self-pay

## 2021-03-10 ENCOUNTER — Encounter (HOSPITAL_COMMUNITY): Payer: Self-pay | Admitting: Internal Medicine

## 2021-03-10 VITALS — BP 128/80 | HR 67 | Wt 119.6 lb

## 2021-03-10 DIAGNOSIS — Z79899 Other long term (current) drug therapy: Secondary | ICD-10-CM | POA: Diagnosis not present

## 2021-03-10 DIAGNOSIS — I428 Other cardiomyopathies: Secondary | ICD-10-CM | POA: Diagnosis not present

## 2021-03-10 DIAGNOSIS — Z8249 Family history of ischemic heart disease and other diseases of the circulatory system: Secondary | ICD-10-CM | POA: Diagnosis not present

## 2021-03-10 DIAGNOSIS — E785 Hyperlipidemia, unspecified: Secondary | ICD-10-CM | POA: Diagnosis not present

## 2021-03-10 DIAGNOSIS — I5022 Chronic systolic (congestive) heart failure: Secondary | ICD-10-CM

## 2021-03-10 DIAGNOSIS — E039 Hypothyroidism, unspecified: Secondary | ICD-10-CM | POA: Diagnosis not present

## 2021-03-10 DIAGNOSIS — I5042 Chronic combined systolic (congestive) and diastolic (congestive) heart failure: Secondary | ICD-10-CM | POA: Insufficient documentation

## 2021-03-10 DIAGNOSIS — I493 Ventricular premature depolarization: Secondary | ICD-10-CM | POA: Diagnosis not present

## 2021-03-10 LAB — CBC
HCT: 37.1 % (ref 36.0–46.0)
Hemoglobin: 12.2 g/dL (ref 12.0–15.0)
MCH: 32 pg (ref 26.0–34.0)
MCHC: 32.9 g/dL (ref 30.0–36.0)
MCV: 97.4 fL (ref 80.0–100.0)
Platelets: 233 10*3/uL (ref 150–400)
RBC: 3.81 MIL/uL — ABNORMAL LOW (ref 3.87–5.11)
RDW: 11.9 % (ref 11.5–15.5)
WBC: 5.7 10*3/uL (ref 4.0–10.5)
nRBC: 0 % (ref 0.0–0.2)

## 2021-03-10 LAB — BASIC METABOLIC PANEL
Anion gap: 5 (ref 5–15)
BUN: 14 mg/dL (ref 6–20)
CO2: 29 mmol/L (ref 22–32)
Calcium: 9.1 mg/dL (ref 8.9–10.3)
Chloride: 106 mmol/L (ref 98–111)
Creatinine, Ser: 0.8 mg/dL (ref 0.44–1.00)
GFR, Estimated: >60 mL/min (ref >60)
Glucose, Bld: 102 mg/dL — ABNORMAL HIGH (ref 70–99)
Potassium: 3.8 mmol/L (ref 3.5–5.1)
Sodium: 140 mmol/L (ref 135–145)

## 2021-03-10 LAB — TSH: TSH: 2.476 u[IU]/mL (ref 0.350–4.500)

## 2021-03-10 LAB — BRAIN NATRIURETIC PEPTIDE: B Natriuretic Peptide: 97.2 pg/mL (ref 0.0–100.0)

## 2021-03-10 MED ORDER — DAPAGLIFLOZIN PROPANEDIOL 5 MG PO TABS: 5.0000 mg | ORAL_TABLET | Freq: Every day | ORAL | 3 refills | Status: DC

## 2021-03-10 NOTE — Progress Notes (Signed)
ADVANCED HF CLINIC NOTE  Referring Physician: Dr. Bjorn Pippin Primary Care: Mitzi Hansen, NP Primary Cardiologist: Little Ishikawa, MD  HPI: Janet Ramirez is a 60 yo woman with hypothyroidism, HL, PVCs and systolic HF   No known h/o of heart problems until 12/21 -> admitted with CP and mildly elevated hstrop. Echo 12/21 EF 25-30%, severe LV dilatation, normal RV function, mild to moderate MR, mild AI.  Cath showed normal coronary arteries with well-compensated hemodynamics, 3-day Zio showed one 8 beat run of NSVT, and 21 runs of SVT with longest lasting 18 beats, frequent PVCs (6.1%).  cMRI 11/13/20: 1. LVEF 35% 2.  Normal RV size and systolic function (EF 58%) 3. Basal septal midwall LGE and RV insertion LGE, which is a scar pattern seen in nonischemic cardiomyopathies and associated with worse prognosis 4.  Mild aortic regurgitation, regurgitant fraction 9%  28 day event monitor 1/22:  PVCs 2%  I saw her for the first time on 11/26/20  EKG with high PVC burden. 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 Developed CP so stopped. Saw Amy Clegg and we discussed and decided on short trial of amio but patient refused due to potential SEs.   She saw Dr. Johney Frame in 3/22 who felt PVCs the result of her CM and not cause and did not think she required AAD or ablation (PVCs felt to be in LV)  Today she returns for HF follow up. At last visit Farxiga added but stopped on 02/23/21 (was on it for over a month) due to migraines so [place]. No starting to have recurrent edema. Mild SOB with activity.  No orthopnea or PND. No weight gain. BP at home SBPs mostly around 110 but some 95-99. HR 50-60s    Echo 01/07/21: EF 30-35% RV normal mild AI  Personally reviewed    Past Medical History:  Diagnosis Date  . CHF (congestive heart failure) (HCC)   . Hyperlipidemia   . Hypothyroidism     Current Outpatient Medications  Medication Sig Dispense Refill  . alum & mag hydroxide-simeth  (MAALOX/MYLANTA) 200-200-20 MG/5ML suspension Take 30 mLs by mouth every 6 (six) hours as needed for indigestion or heartburn.    . calcium carbonate (TUMS - DOSED IN MG ELEMENTAL CALCIUM) 500 MG chewable tablet Chew 1-2 tablets by mouth as needed for indigestion or heartburn.    . carvedilol (COREG) 3.125 MG tablet Take 1 tablet (3.125 mg total) by mouth 2 (two) times daily with a meal. 180 tablet 3  . sacubitril-valsartan (ENTRESTO) 24-26 MG Take 1 tablet by mouth 2 (two) times daily. 60 tablet 6  . simethicone (MYLICON) 125 MG chewable tablet Chew 125 mg by mouth every 6 (six) hours as needed for flatulence.     No current facility-administered medications for this encounter.    Allergies  Allergen Reactions  . Codeine Itching  . Sulfacetamide Sodium Diarrhea      Social History   Socioeconomic History  . Marital status: Married    Spouse name: Not on file  . Number of children: Not on file  . Years of education: Not on file  . Highest education level: Not on file  Occupational History  . Not on file  Tobacco Use  . Smoking status: Never Smoker  . Smokeless tobacco: Never Used  Substance and Sexual Activity  . Alcohol use: Yes    Comment: socially  . Drug use: Never  . Sexual activity: Yes    Partners: Male  Other  Topics Concern  . Not on file  Social History Narrative  . Not on file   Social Determinants of Health   Financial Resource Strain: Not on file  Food Insecurity: Not on file  Transportation Needs: Not on file  Physical Activity: Not on file  Stress: Not on file  Social Connections: Not on file  Intimate Partner Violence: Not on file      Family History  Problem Relation Age of Onset  . Hypertension Mother   . Heart disease Father        s/p stent and pacemaker placed in his 68s  . Diabetes Mellitus II Father     Vitals:   03/10/21 1342  BP: 128/80  Pulse: 67  SpO2: 100%  Weight: 54.3 kg (119 lb 9.6 oz)    PHYSICAL EXAM: General:  Well  appearing. No resp difficulty HEENT: normal Neck: supple. no JVD. Carotids 2+ bilat; no bruits. No lymphadenopathy or thryomegaly appreciated. Cor: PMI nondisplaced. Regular rate & rhythm. No rubs, gallops or murmurs. Lungs: clear Abdomen: soft, nontender, nondistended. No hepatosplenomegaly. No bruits or masses. Good bowel sounds. Extremities: no cyanosis, clubbing, rash, edema Neuro: alert & orientedx3, cranial nerves grossly intact. moves all 4 extremities w/o difficulty. Affect pleasant   ASSESSMENT & PLAN:  1. 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 - Echo 12/21 EF 25 to 30%, severe LV dilatation, normal RV function, mild to moderate MR, mild AI.   - Cath 12/21 normal coronary arteries - Zio 09/2020 showed an 8 beat run of NSVT, and 21 runs of SVT with longest lasting 18 beats, frequent PVCs (6.1% with 3 morphologies. Highest daily burden 8.7%) - 28 day event monitor 1/22:  PVCs 2% - cMRI 11/13/20: LVEF 35% RVEF 58%. Basal septal midwall LGE and RV insertion LGE - Echo 01/07/21: EF 30-35% RV normal mild AI  Personally reviewed - Etiology unclear. 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 Has seen Dr. Johney Frame who felt PVC s result of CM and not cause - Stable NYHA II.  - Continue carvedilol 3.125 bid (recently decreased due to low BP) - Continue Entresto 24-26 mg twice daily.  Unable to up titrate with soft BP noted at home.  - Off spiro due to hypotension - Resume Farxiga 5mg  daily - Will continue to follow for LV recovery.  - Labs today  2. Frequent PVCs:  - Zio patch x 3 days on 09/29/2020 showed an 8 beat run of NSVT, and 21 runs of SVT with longest lasting 18 beats, frequent PVCs (6.1% with 3 morphologies. Highest daily burden 8.7%) - 28 day event monitor 1/22:  PVCs 2% - Intolerant mexilitene after a couple of doses due to CP. Refused amio - Saw Dr. 2/22 in 3/22 who felt PVCs the result of her CM and not cause and did not  think she required AAD or ablation (PVCs felt to be in LV) - Continue to monitor  3. Hypothyroidism - TSH normal 09/26/20   09/28/20, MD  1:52 PM

## 2021-03-10 NOTE — Addendum Note (Signed)
Encounter addended by: Dolores Patty, MD on: 03/10/2021 4:15 PM  Actions taken: Level of Service modified, Visit diagnoses modified

## 2021-03-10 NOTE — Patient Instructions (Addendum)
Labs done today. We will contact you only if your labs are abnormal.  RESTART Farxiga 5mg  (1 tablet) by mouth daily.   No other medication changes were made. Please continue all current medications as prescribed.  Your physician recommends that you schedule a follow-up appointment in: 2 months with an echo prior to your exam.   If you have any questions or concerns before your next appointment please send a message through [place] or call our office at [phone].    TO LEAVE A MESSAGE FOR THE NURSE SELECT OPTION 2, PLEASE LEAVE A MESSAGE INCLUDING: . YOUR NAME . DATE OF BIRTH . CALL BACK NUMBER . REASON FOR CALL**this is important as we prioritize the call backs  YOU WILL RECEIVE A CALL BACK THE SAME DAY AS LONG AS YOU CALL BEFORE 4:00 PM   Do the following things EVERYDAY: 1) Weigh yourself in the morning before breakfast. Write it down and keep it in a log. 2) Take your medicines as prescribed 3) Eat low salt foods--Limit salt (sodium) to 2000 mg per day.  4) Stay as active as you can everyday 5) Limit all fluids for the day to less than 2 liters   At the [facility], you and your health needs are our priority. As part of our continuing mission to provide you with exceptional heart care, we have created designated Provider Care Teams. These Care Teams include your primary Cardiologist (physician) and Advanced Practice Providers (APPs- Physician Assistants and Nurse Practitioners) who all work together to provide you with the care you need, when you need it.   You may see any of the following providers on your designated Care Team at your next follow up: [phone] Dr Marland Kitchen . Dr Arvilla Meres . Marca Ancona, NP . Tonye Becket, PA . Robbie Lis, PharmD   Please be sure to bring in all your medications bottles to every appointment.

## 2021-04-22 ENCOUNTER — Other Ambulatory Visit: Payer: Self-pay | Admitting: Physician Assistant

## 2021-05-31 ENCOUNTER — Ambulatory Visit (HOSPITAL_BASED_OUTPATIENT_CLINIC_OR_DEPARTMENT_OTHER)
Admission: RE | Admit: 2021-05-31 | Discharge: 2021-05-31 | Disposition: A | Discharge status: Home / Self Care | Payer: 59 | Source: Ambulatory Visit | Attending: Internal Medicine | Admitting: Internal Medicine

## 2021-05-31 ENCOUNTER — Ambulatory Visit (HOSPITAL_COMMUNITY)
Admission: RE | Admit: 2021-05-31 | Discharge: 2021-05-31 | Disposition: A | Discharge status: Home / Self Care | Payer: 59 | Source: Ambulatory Visit | Attending: Internal Medicine | Admitting: Internal Medicine

## 2021-05-31 ENCOUNTER — Other Ambulatory Visit: Payer: Self-pay

## 2021-05-31 ENCOUNTER — Encounter (HOSPITAL_COMMUNITY): Payer: Self-pay | Admitting: Internal Medicine

## 2021-05-31 VITALS — BP 118/69 | HR 61 | Wt 118.0 lb

## 2021-05-31 DIAGNOSIS — I5022 Chronic systolic (congestive) heart failure: Secondary | ICD-10-CM

## 2021-05-31 DIAGNOSIS — Z711 Person with feared health complaint in whom no diagnosis is made: Secondary | ICD-10-CM

## 2021-05-31 DIAGNOSIS — E785 Hyperlipidemia, unspecified: Secondary | ICD-10-CM | POA: Diagnosis not present

## 2021-05-31 DIAGNOSIS — I351 Nonrheumatic aortic (valve) insufficiency: Secondary | ICD-10-CM | POA: Insufficient documentation

## 2021-05-31 DIAGNOSIS — I493 Ventricular premature depolarization: Secondary | ICD-10-CM

## 2021-05-31 LAB — COMPREHENSIVE METABOLIC PANEL
ALT: 19 U/L (ref 0–44)
AST: 27 U/L (ref 15–41)
Albumin: 4.2 g/dL (ref 3.5–5.0)
Alkaline Phosphatase: 52 U/L (ref 38–126)
Anion gap: 5 (ref 5–15)
BUN: 10 mg/dL (ref 6–20)
CO2: 29 mmol/L (ref 22–32)
Calcium: 9.5 mg/dL (ref 8.9–10.3)
Chloride: 107 mmol/L (ref 98–111)
Creatinine, Ser: 0.76 mg/dL (ref 0.44–1.00)
GFR, Estimated: >60 mL/min (ref >60)
Glucose, Bld: 96 mg/dL (ref 70–99)
Potassium: 4.1 mmol/L (ref 3.5–5.1)
Sodium: 141 mmol/L (ref 135–145)
Total Bilirubin: 0.7 mg/dL (ref 0.3–1.2)
Total Protein: 6.5 g/dL (ref 6.5–8.1)

## 2021-05-31 LAB — ECHOCARDIOGRAM COMPLETE
AR max vel: 2.09 cm2
AV Area VTI: 2.3 cm2
AV Area mean vel: 2.03 cm2
AV Mean grad: 2 mmHg
AV Peak grad: 4.2 mmHg
Ao pk vel: 1.03 m/s
Area-P 1/2: 2.8 cm2
MV VTI: 2.96 cm2
P 1/2 time: 492 msec
S' Lateral: 3.8 cm

## 2021-05-31 LAB — BRAIN NATRIURETIC PEPTIDE: B Natriuretic Peptide: 101.1 pg/mL — ABNORMAL HIGH (ref 0.0–100.0)

## 2021-05-31 NOTE — Patient Instructions (Addendum)
Labs done today. We will contact you only if your labs are abnormal.  No medication changes were made. Please continue all current medications as prescribed.  Your provider has requested that you have a abdominal Ultrasound. This has to be approved through your insurance prior to scheduling, once approved we will  contact you to schedule an appointment.   Your physician recommends that you schedule a follow-up appointment in: 4 months  If you have any questions or concerns before your next appointment please send [place] a message through [place] or call our office at [phone].    TO LEAVE A MESSAGE FOR THE NURSE SELECT OPTION 2, PLEASE LEAVE A MESSAGE INCLUDING: YOUR NAME DATE OF BIRTH CALL BACK NUMBER REASON FOR CALL**this is important as we prioritize the call backs  YOU WILL RECEIVE A CALL BACK THE SAME DAY AS LONG AS YOU CALL BEFORE 4:00 PM   Do the following things EVERYDAY: Weigh yourself in the morning before breakfast. Write it down and keep it in a log. Take your medicines as prescribed Eat low salt foods--Limit salt (sodium) to 2000 mg per day.  Stay as active as you can everyday Limit all fluids for the day to less than 2 liters   At the [facility], you and your health needs are our priority. As part of our continuing mission to provide you with exceptional heart care, we have created designated Provider Care Teams. These Care Teams include your primary Cardiologist (physician) and Advanced Practice Providers (APPs- Physician Assistants and Nurse Practitioners) who all work together to provide you with the care you need, when you need it.   You may see any of the following providers on your designated Care Team at your next follow up: Dr Arvilla Meres Dr Carron Curie, NP Robbie Lis, [place] Karle Plumber, PharmD   Please be sure to bring in all your medications bottles to every appointment.

## 2021-05-31 NOTE — Progress Notes (Addendum)
ADVANCED HF CLINIC NOTE  Referring Physician: Dr. Bjorn Pippin Primary Care: Mitzi Hansen, NP Primary Cardiologist: Little Ishikawa, MD  HPI: Janet Ramirez is a 60 yo woman with hypothyroidism, HL, PVCs and systolic HF   No known h/o of heart problems until 12/21 -> admitted with CP and mildly elevated hstrop. Echo 12/21 EF 25-30%, severe LV dilatation, normal RV function, mild to moderate MR, mild AI.  Cath showed normal coronary arteries with well-compensated hemodynamics, 3-day Zio showed one 8 beat run of NSVT, and 21 runs of SVT with longest lasting 18 beats, frequent PVCs (6.1%).  cMRI 11/13/20: 1. LVEF 35% 2.  Normal RV size and systolic function (EF 58%) 3. Basal septal midwall LGE and RV insertion LGE, which is a scar pattern seen in nonischemic cardiomyopathies and associated with worse prognosis 4.  Mild aortic regurgitation, regurgitant fraction 9%  28 day event monitor 1/22:  PVCs 2%  I saw her for the first time on 11/26/20  EKG with high PVC burden. 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 Developed CP so stopped. Saw Amy Clegg and we discussed and decided on short trial of amio but patient refused due to potential SEs.   She saw Dr. Johney Frame in 3/22 who felt PVCs the result of her CM and not cause and did not think she required AAD or ablation (PVCs felt to be in LV)  Today she returns for HF follow up. Doing well. Wasn't able to tolerate Farxiga 10 due to migraines. Now tolerating 5mg  daily. Remains active cutting the grass and other activities. Able to walk without problem. No further bendopnea. Gets fatigued at times. Gets pain in her left shoulder and arm when eating certain foods particularly fried foods. BP at home 100-110/50-60s   Echo today 05/31/21: EF 35-40% Personally reviewed   Echo 01/07/21: EF 30-35% RV normal mild AI  Personally reviewed    Past Medical History:  Diagnosis Date   CHF (congestive heart failure) (HCC)    Hyperlipidemia     Hypothyroidism     Current Outpatient Medications  Medication Sig Dispense Refill   alum & mag hydroxide-simeth (MAALOX/MYLANTA) 200-200-20 MG/5ML suspension Take 30 mLs by mouth every 6 (six) hours as needed for indigestion or heartburn.     calcium carbonate (TUMS - DOSED IN MG ELEMENTAL CALCIUM) 500 MG chewable tablet Chew 1-2 tablets by mouth as needed for indigestion or heartburn.     carvedilol (COREG) 3.125 MG tablet Take 1 tablet (3.125 mg total) by mouth 2 (two) times daily with a meal. 180 tablet 3   dapagliflozin propanediol (FARXIGA) 5 MG TABS tablet Take 1 tablet (5 mg total) by mouth daily. 90 tablet 3   ENTRESTO 24-26 MG TAKE 1 TABLET BY MOUTH TWICE DAILY 60 tablet 10   simethicone (MYLICON) 125 MG chewable tablet Chew 125 mg by mouth every 6 (six) hours as needed for flatulence.     No current facility-administered medications for this encounter.    Allergies  Allergen Reactions   Codeine Itching   Sulfacetamide Sodium Diarrhea      Social History   Socioeconomic History   Marital status: Married    Spouse name: Not on file   Number of children: Not on file   Years of education: Not on file   Highest education level: Not on file  Occupational History   Not on file  Tobacco Use   Smoking status: Never   Smokeless tobacco: Never  Substance and Sexual Activity  Alcohol use: Yes    Comment: socially   Drug use: Never   Sexual activity: Yes    Partners: Male  Other Topics Concern   Not on file  Social History Narrative   Not on file   Social Determinants of Health   Financial Resource Strain: Not on file  Food Insecurity: Not on file  Transportation Needs: Not on file  Physical Activity: Not on file  Stress: Not on file  Social Connections: Not on file  Intimate Partner Violence: Not on file      Family History  Problem Relation Age of Onset   Hypertension Mother    Heart disease Father        s/p stent and pacemaker placed in his 19s    Diabetes Mellitus II Father     Vitals:   05/31/21 1522  BP: 118/69  Pulse: 61  SpO2: 97%  Weight: 53.5 kg (118 lb)     PHYSICAL EXAM: General:  Well appearing. No resp difficulty HEENT: normal Neck: supple. no JVD. Carotids 2+ bilat; no bruits. No lymphadenopathy or thryomegaly appreciated. Cor: PMI nondisplaced. Regular rate & rhythm. No rubs, gallops or murmurs. Lungs: clear Abdomen: soft, nontender, nondistended. No hepatosplenomegaly. No bruits or masses. Good bowel sounds. Extremities: no cyanosis, clubbing, rash, edema Neuro: alert & orientedx3, cranial nerves grossly intact. moves all 4 extremities w/o difficulty. Affect pleasant   ASSESSMENT & PLAN:  1. 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 - Echo 12/21 EF 25 to 30%, severe LV dilatation, normal RV function, mild to moderate MR, mild AI.   - Cath 12/21 normal coronary arteries - Zio 09/2020 showed an 8 beat run of NSVT, and 21 runs of SVT with longest lasting 18 beats, frequent PVCs (6.1% with 3 morphologies. Highest daily burden 8.7%) - 28 day event monitor 1/22:  PVCs 2% - cMRI 11/13/20: LVEF 35% RVEF 58%. Basal septal midwall LGE and RV insertion LGE - Echo 01/07/21: EF 30-35% RV normal mild AI   - Echo today 05/31/21: EF 35-40% Personally reviewed - Etiology unclear. 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 Has seen Dr. Johney Frame who felt PVC s result of CM and not cause - Doing well NYHA II - Continue carvedilol 3.125 bid (recently decreased due to low BP) - Continue Entresto 24-26 mg twice daily.   - Off spiro due to hypotension - we discussed rechallenging with this today but she would like to defer to previous low BP - Farxiga 5mg  daily - Labs today  2. Frequent PVCs:  - Zio patch x 3 days on 09/29/2020 showed an 8 beat run of NSVT, and 21 runs of SVT with longest lasting 18 beats, frequent PVCs (6.1% with 3 morphologies. Highest daily burden 8.7%) - 28 day  event monitor 1/22:  PVCs 2% - Intolerant mexilitene after a couple of doses due to CP. Refused amio - Saw Dr. 2/22 in 3/22 who felt PVCs the result of her CM and not cause and did not think she required AAD or ablation (PVCs felt to be in LV) - PVCs not heard on exam today  3. Hypothyroidism - TSH normal 09/26/20   4. Post prandial abdominal and shoulder pain - concerning for gallbladder colic. Check RUQ u/s  09/28/20, MD  3:52 PM

## 2021-05-31 NOTE — Progress Notes (Signed)
  Echocardiogram 2D Echocardiogram has been performed.  JAELLA WEINERT 05/31/2021, 2:49 PM

## 2021-06-07 ENCOUNTER — Other Ambulatory Visit (HOSPITAL_COMMUNITY): Payer: Self-pay | Admitting: Internal Medicine

## 2021-06-08 ENCOUNTER — Other Ambulatory Visit (HOSPITAL_COMMUNITY): Payer: 59

## 2021-06-10 ENCOUNTER — Ambulatory Visit (HOSPITAL_COMMUNITY)
Admission: RE | Admit: 2021-06-10 | Discharge: 2021-06-10 | Disposition: A | Discharge status: Home / Self Care | Payer: 59 | Source: Ambulatory Visit | Attending: Internal Medicine | Admitting: Internal Medicine

## 2021-06-10 ENCOUNTER — Other Ambulatory Visit: Payer: Self-pay

## 2021-06-10 DIAGNOSIS — Z711 Person with feared health complaint in whom no diagnosis is made: Secondary | ICD-10-CM | POA: Diagnosis not present

## 2021-06-10 DIAGNOSIS — E785 Hyperlipidemia, unspecified: Secondary | ICD-10-CM | POA: Diagnosis present

## 2021-09-22 ENCOUNTER — Ambulatory Visit (HOSPITAL_COMMUNITY)
Admission: RE | Admit: 2021-09-22 | Discharge: 2021-09-22 | Disposition: A | Discharge status: Home / Self Care | Payer: 59 | Source: Ambulatory Visit | Attending: Internal Medicine | Admitting: Internal Medicine

## 2021-09-22 ENCOUNTER — Encounter (HOSPITAL_COMMUNITY): Payer: Self-pay | Admitting: Internal Medicine

## 2021-09-22 ENCOUNTER — Other Ambulatory Visit: Payer: Self-pay

## 2021-09-22 VITALS — BP 122/70 | HR 57 | Wt 121.4 lb

## 2021-09-22 DIAGNOSIS — I428 Other cardiomyopathies: Secondary | ICD-10-CM | POA: Diagnosis not present

## 2021-09-22 DIAGNOSIS — I959 Hypotension, unspecified: Secondary | ICD-10-CM | POA: Diagnosis not present

## 2021-09-22 DIAGNOSIS — E039 Hypothyroidism, unspecified: Secondary | ICD-10-CM | POA: Diagnosis not present

## 2021-09-22 DIAGNOSIS — I5021 Acute systolic (congestive) heart failure: Secondary | ICD-10-CM | POA: Diagnosis not present

## 2021-09-22 DIAGNOSIS — R002 Palpitations: Secondary | ICD-10-CM

## 2021-09-22 DIAGNOSIS — Z79899 Other long term (current) drug therapy: Secondary | ICD-10-CM | POA: Diagnosis not present

## 2021-09-22 DIAGNOSIS — I493 Ventricular premature depolarization: Secondary | ICD-10-CM | POA: Diagnosis not present

## 2021-09-22 DIAGNOSIS — I5022 Chronic systolic (congestive) heart failure: Secondary | ICD-10-CM

## 2021-09-22 DIAGNOSIS — I5042 Chronic combined systolic (congestive) and diastolic (congestive) heart failure: Secondary | ICD-10-CM | POA: Insufficient documentation

## 2021-09-22 LAB — BASIC METABOLIC PANEL
Anion gap: 6 (ref 5–15)
BUN: 10 mg/dL (ref 6–20)
CO2: 27 mmol/L (ref 22–32)
Calcium: 9.2 mg/dL (ref 8.9–10.3)
Chloride: 106 mmol/L (ref 98–111)
Creatinine, Ser: 0.81 mg/dL (ref 0.44–1.00)
GFR, Estimated: >60 mL/min (ref >60)
Glucose, Bld: 95 mg/dL (ref 70–99)
Potassium: 3.9 mmol/L (ref 3.5–5.1)
Sodium: 139 mmol/L (ref 135–145)

## 2021-09-22 LAB — BRAIN NATRIURETIC PEPTIDE: B Natriuretic Peptide: 87.2 pg/mL (ref 0.0–100.0)

## 2021-09-22 NOTE — Patient Instructions (Signed)
Medication Changes:  No Changes  Lab Work:  Labs done today, your results will be available in MyChart, we will contact you for abnormal readings.   Testing/Procedures:  Your physician has requested that you have an echocardiogram. Echocardiography is a painless test that uses sound waves to create images of your heart. It provides your doctor with information about the size and shape of your heart and how well your hearts chambers and valves are working. This procedure takes approximately one hour. There are no restrictions for this procedure.   Referrals:  none  Special Instructions // Education:  none  Follow-Up in: 4 moths (April 2023) with Echocardiogram ** Call in March For Appointment   At the [facility], you and your health needs are our priority. We have a designated team specialized in the treatment of Heart Failure. This Care Team includes your primary Heart Failure Specialized Cardiologist (physician), Advanced Practice Providers (APPs- Physician Assistants and Nurse Practitioners), and Pharmacist who all work together to provide you with the care you need, when you need it.   You may see any of the following providers on your designated Care Team at your next follow up:  Dr Arvilla Meres Dr Carron Curie, NP Robbie Lis, [place] [facility] [place], [place] Karle Plumber, PharmD   Please be sure to bring in all your medications bottles to every appointment.   Need to Contact us:  If you have any questions or concerns before your next appointment please send [place] a message through [place] or call our office at [phone].    TO LEAVE A MESSAGE FOR THE NURSE SELECT OPTION 2, PLEASE LEAVE A MESSAGE INCLUDING: YOUR NAME DATE OF BIRTH CALL BACK NUMBER REASON FOR CALL**this is important as we prioritize the call backs  YOU WILL RECEIVE A CALL BACK THE SAME DAY AS LONG AS YOU CALL BEFORE 4:00 PM

## 2021-09-22 NOTE — Progress Notes (Signed)
ReDS Vest / Clip - 09/22/21 1100       ReDS Vest / Clip   Station Marker A    Ruler Value 28    ReDS Value Range Low volume    ReDS Actual Value 21

## 2021-09-22 NOTE — Progress Notes (Signed)
ADVANCED HF CLINIC NOTE  Referring Physician: Dr. Gardiner Rhyme Primary Care: Carolee Rota, NP Primary Cardiologist: Donato Heinz, MD  HPI: Janet Ramirez is a 60 yo woman with hypothyroidism, HL, PVCs and systolic HF   No known h/o of heart problems until 12/21 -> admitted with CP and mildly elevated hstrop. Echo 12/21 EF 25-30%, severe LV dilatation, normal RV function, mild to moderate MR, mild AI.  Cath showed normal coronary arteries with well-compensated hemodynamics, 3-day Zio showed one 8 beat run of NSVT, and 21 runs of SVT with longest lasting 18 beats, frequent PVCs (6.1%).  cMRI 11/13/20: 1. LVEF 35% 2.  Normal RV size and systolic function (EF 99991111) 3. Basal septal midwall LGE and RV insertion LGE, which is a scar pattern seen in nonischemic cardiomyopathies and associated with worse prognosis 4.  Mild aortic regurgitation, regurgitant fraction 9%  28 day event monitor 1/22:  PVCs 2%  I saw her for the first time on 11/26/20  EKG with high PVC burden. 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 Developed CP so stopped. Saw Amy Clegg and we discussed and decided on short trial of amio but patient refused due to potential SEs.   She saw Dr. Rayann Heman in 3/22 who felt PVCs the result of her CM and not cause and did not think she required AAD or ablation (PVCs felt to be in LV)  Wasn't able to tolerate Farxiga 10 due to migraines. Now tolerating 5mg  daily.  Here for routine f/u. For the last month has felt more bloated. No LE edema. Able to still do activities. Occasional SOB. BP ok. SBP usually 100-110. No problems with meds. Not taking loop diuretic  Echo 05/31/21: EF 35-40% Personally reviewed  Echo 01/07/21: EF 30-35% RV normal mild AI  Personally reviewed    Past Medical History:  Diagnosis Date   CHF (congestive heart failure) (HCC)    Hyperlipidemia    Hypothyroidism     Current Outpatient Medications  Medication Sig Dispense Refill   alum & mag  hydroxide-simeth (MAALOX/MYLANTA) 200-200-20 MG/5ML suspension Take 30 mLs by mouth every 6 (six) hours as needed for indigestion or heartburn.     calcium carbonate (TUMS - DOSED IN MG ELEMENTAL CALCIUM) 500 MG chewable tablet Chew 1-2 tablets by mouth as needed for indigestion or heartburn.     carvedilol (COREG) 3.125 MG tablet Take 1 tablet (3.125 mg total) by mouth 2 (two) times daily with a meal. 180 tablet 3   cholecalciferol (VITAMIN D3) 25 MCG (1000 UNIT) tablet Take 1,000 Units by mouth daily.     ENTRESTO 24-26 MG TAKE 1 TABLET BY MOUTH TWICE DAILY 60 tablet 10   FARXIGA 5 MG TABS tablet TAKE 1 TABLET(5 MG) BY MOUTH DAILY 30 tablet 11   Multiple Vitamins-Minerals (MULTIVITAMIN WITH MINERALS) tablet Take 1 tablet by mouth daily.     simethicone (MYLICON) 0000000 MG chewable tablet Chew 125 mg by mouth every 6 (six) hours as needed for flatulence.     vitamin C (ASCORBIC ACID) 500 MG tablet Take 500 mg by mouth daily.     No current facility-administered medications for this encounter.    Allergies  Allergen Reactions   Codeine Itching   Sulfacetamide Sodium Diarrhea      Social History   Socioeconomic History   Marital status: Married    Spouse name: Not on file   Number of children: Not on file   Years of education: Not on file  Highest education level: Not on file  Occupational History   Not on file  Tobacco Use   Smoking status: Never   Smokeless tobacco: Never  Substance and Sexual Activity   Alcohol use: Yes    Comment: socially   Drug use: Never   Sexual activity: Yes    Partners: Male  Other Topics Concern   Not on file  Social History Narrative   Not on file   Social Determinants of Health   Financial Resource Strain: Not on file  Food Insecurity: Not on file  Transportation Needs: Not on file  Physical Activity: Not on file  Stress: Not on file  Social Connections: Not on file  Intimate Partner Violence: Not on file      Family History  Problem  Relation Age of Onset   Hypertension Mother    Heart disease Father        s/p stent and pacemaker placed in his 60s   Diabetes Mellitus II Father     Vitals:   09/22/21 1052  BP: 122/70  Pulse: (!) 57  SpO2: 100%  Weight: 55.1 kg (121 lb 6.4 oz)     PHYSICAL EXAM: General:  Well appearing. No resp difficulty HEENT: normal Neck: supple. no JVD. Carotids 2+ bilat; no bruits. No lymphadenopathy or thryomegaly appreciated. Cor: PMI nondisplaced. Regular rate & rhythm. No rubs, gallops or murmurs. Lungs: clear Abdomen: soft, nontender, nondistended. No hepatosplenomegaly. No bruits or masses. Good bowel sounds. Extremities: no cyanosis, clubbing, rash, edema Neuro: alert & orientedx3, cranial nerves grossly intact. moves all 4 extremities w/o difficulty. Affect pleasant   ASSESSMENT & PLAN:  1. 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 - Echo 12/21 EF 25 to 30%, severe LV dilatation, normal RV function, mild to moderate MR, mild AI.   - Cath 12/21 normal coronary arteries - Zio 09/2020 showed an 8 beat run of NSVT, and 21 runs of SVT with longest lasting 18 beats, frequent PVCs (6.1% with 3 morphologies. Highest daily burden 8.7%) - 28 day event monitor 1/22:  PVCs 2% - cMRI 11/13/20: LVEF 35% RVEF 58%. Basal septal midwall LGE and RV insertion LGE - Echo 01/07/21: EF 30-35% RV normal mild AI   - Echo today 05/31/21: EF 35-40% Personally reviewed - Etiology unclear. 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 Has seen Dr. Johney Frame who felt PVC s result of CM and not cause - NYHA II. Volume status looks good despite feeling of bloating ReDS low at 22% - Continue carvedilol 3.125 bid (Previously decreased due to low BP) - Continue Entresto 24-26 mg twice daily.   - Off spiro due to hypotension - Continue Farxiga 5mg  daily - Labs today  2. Frequent PVCs:  - Zio patch x 3 days on 09/29/2020 showed an 8 beat run of NSVT, and 21 runs of  SVT with longest lasting 18 beats, frequent PVCs (6.1% with 3 morphologies. Highest daily burden 8.7%) - 28 day event monitor 1/22:  PVCs 2% - Intolerant mexilitene after a couple of doses due to CP. Refused amio - Saw Dr. 2/22 in 3/22 who felt PVCs the result of her CM and not cause and did not think she required AAD or ablation (PVCs felt to be in LV) - No PVCs on ECG today  3. Hypothyroidism - TSH normal 09/26/20   09/28/20, MD  11:06 AM

## 2021-10-13 ENCOUNTER — Other Ambulatory Visit: Payer: Self-pay | Admitting: Cardiology

## 2021-11-10 ENCOUNTER — Other Ambulatory Visit: Payer: Self-pay | Admitting: Cardiology

## 2021-12-07 ENCOUNTER — Other Ambulatory Visit: Payer: Self-pay | Admitting: Cardiology

## 2021-12-07 MED ORDER — CARVEDILOL 3.125 MG PO TABS: 3.1250 mg | ORAL_TABLET | Freq: Two times a day (BID) | ORAL | 0 refills | Status: DC

## 2022-01-05 ENCOUNTER — Other Ambulatory Visit (HOSPITAL_COMMUNITY): Payer: Self-pay | Admitting: *Deleted

## 2022-01-05 ENCOUNTER — Encounter (HOSPITAL_COMMUNITY): Payer: Self-pay | Admitting: Internal Medicine

## 2022-01-05 MED ORDER — CARVEDILOL 3.125 MG PO TABS: 3.1250 mg | ORAL_TABLET | Freq: Two times a day (BID) | ORAL | 3 refills | Status: DC

## 2022-01-06 ENCOUNTER — Other Ambulatory Visit: Payer: Self-pay | Admitting: Cardiology

## 2022-01-18 ENCOUNTER — Other Ambulatory Visit (HOSPITAL_COMMUNITY): Payer: Self-pay | Admitting: *Deleted

## 2022-01-18 DIAGNOSIS — I5022 Chronic systolic (congestive) heart failure: Secondary | ICD-10-CM

## 2022-01-18 DIAGNOSIS — I493 Ventricular premature depolarization: Secondary | ICD-10-CM

## 2022-01-21 ENCOUNTER — Encounter (HOSPITAL_COMMUNITY): Payer: 59 | Admitting: Internal Medicine

## 2022-01-21 ENCOUNTER — Ambulatory Visit (HOSPITAL_COMMUNITY)
Admission: RE | Admit: 2022-01-21 | Discharge: 2022-01-21 | Disposition: A | Discharge status: Home / Self Care | Payer: 59 | Source: Ambulatory Visit | Attending: Internal Medicine | Admitting: Internal Medicine

## 2022-01-21 DIAGNOSIS — I5042 Chronic combined systolic (congestive) and diastolic (congestive) heart failure: Secondary | ICD-10-CM | POA: Insufficient documentation

## 2022-01-21 DIAGNOSIS — I080 Rheumatic disorders of both mitral and aortic valves: Secondary | ICD-10-CM | POA: Diagnosis not present

## 2022-01-21 DIAGNOSIS — E039 Hypothyroidism, unspecified: Secondary | ICD-10-CM | POA: Diagnosis not present

## 2022-01-21 DIAGNOSIS — I428 Other cardiomyopathies: Secondary | ICD-10-CM | POA: Diagnosis not present

## 2022-01-21 DIAGNOSIS — I5022 Chronic systolic (congestive) heart failure: Secondary | ICD-10-CM

## 2022-01-21 DIAGNOSIS — I493 Ventricular premature depolarization: Secondary | ICD-10-CM | POA: Diagnosis not present

## 2022-01-21 LAB — ECHOCARDIOGRAM COMPLETE
Area-P 1/2: 2.8 cm2
Calc EF: 56.5 %
P 1/2 time: 769 msec
S' Lateral: 4.7 cm
Single Plane A2C EF: 56.7 %
Single Plane A4C EF: 54.4 %

## 2022-01-21 NOTE — Progress Notes (Signed)
?  Echocardiogram ?2D Echocardiogram has been performed. ? Janet Ramirez ?01/21/2022, 11:42 AM ?

## 2022-01-25 ENCOUNTER — Encounter (HOSPITAL_COMMUNITY): Payer: Self-pay | Admitting: Internal Medicine

## 2022-01-25 ENCOUNTER — Ambulatory Visit (HOSPITAL_COMMUNITY)
Admission: RE | Admit: 2022-01-25 | Discharge: 2022-01-25 | Disposition: A | Discharge status: Home / Self Care | Payer: 59 | Source: Ambulatory Visit | Attending: Internal Medicine | Admitting: Internal Medicine

## 2022-01-25 VITALS — BP 138/88 | HR 65 | Wt 122.0 lb

## 2022-01-25 DIAGNOSIS — I428 Other cardiomyopathies: Secondary | ICD-10-CM | POA: Insufficient documentation

## 2022-01-25 DIAGNOSIS — I493 Ventricular premature depolarization: Secondary | ICD-10-CM

## 2022-01-25 DIAGNOSIS — I5022 Chronic systolic (congestive) heart failure: Secondary | ICD-10-CM

## 2022-01-25 LAB — BASIC METABOLIC PANEL
Anion gap: 10 (ref 5–15)
BUN: 12 mg/dL (ref 8–23)
CO2: 28 mmol/L (ref 22–32)
Calcium: 9.5 mg/dL (ref 8.9–10.3)
Chloride: 103 mmol/L (ref 98–111)
Creatinine, Ser: 0.85 mg/dL (ref 0.44–1.00)
GFR, Estimated: >60 mL/min (ref >60)
Glucose, Bld: 99 mg/dL (ref 70–99)
Potassium: 3.7 mmol/L (ref 3.5–5.1)
Sodium: 141 mmol/L (ref 135–145)

## 2022-01-25 LAB — BRAIN NATRIURETIC PEPTIDE: B Natriuretic Peptide: 79.1 pg/mL (ref 0.0–100.0)

## 2022-01-25 MED ORDER — DAPAGLIFLOZIN PROPANEDIOL 5 MG PO TABS: ORAL_TABLET | ORAL | 11 refills | Status: DC

## 2022-01-25 MED ORDER — ENTRESTO 24-26 MG PO TABS: 1.0000 | ORAL_TABLET | Freq: Two times a day (BID) | ORAL | 11 refills | Status: DC

## 2022-01-25 NOTE — Progress Notes (Signed)
ReDS Vest / Clip - 01/25/22 1500   ? ?  ? ReDS Vest / Clip  ? Station Marker A   ? Ruler Value 26   ? ReDS Value Range Low volume   ? ReDS Actual Value 27   ? ?  ?  ? ?  ? ? ?

## 2022-01-25 NOTE — Progress Notes (Signed)
? ?ADVANCED HF CLINIC NOTE ? ?Referring Physician: Dr. Gardiner Rhyme ?Primary Care: Carolee Rota, NP ?Primary Cardiologist: Donato Heinz, MD ? ?HPI: ?Janet Ramirez is a 61 yo woman with hypothyroidism, HL, PVCs and systolic HF  ? ?No known h/o of heart problems until 12/21 -> admitted with CP and mildly elevated hstrop. Echo 12/21 EF 25-30%, severe LV dilatation, normal RV function, mild to moderate MR, mild AI.  Cath showed normal coronary arteries with well-compensated hemodynamics, 3-day Zio showed one 8 beat run of NSVT, and 21 runs of SVT with longest lasting 18 beats, frequent PVCs (6.1%). ? ?cMRI 11/13/20: ?1. LVEF 35% ?2.  Normal RV size and systolic function (EF 99991111) ?3. Basal septal midwall LGE and RV insertion LGE, which is a scar pattern seen in ?nonischemic cardiomyopathies and associated with worse prognosis ?4.  Mild aortic regurgitation, regurgitant fraction 9% ? ?28 day event monitor 1/22:  PVCs 2% ? ?She was seen for first time 11/26/20  EKG with high PVC burden. 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 Developed CP so stopped. Later decided on short trial of amio but patient refused due to potential SEs.  ? ?She saw Dr. Rayann Heman in 3/22 who felt PVCs the result of her CM and not cause and did not think she required AAD or ablation (PVCs felt to be in LV) ? ?Echo 01/07/21: EF 30-35% RV normal mild AI   ? ?Echo 05/31/21: EF 35-40%  ? ?Wasn't able to tolerate Farxiga 10 due to migraines. Now tolerating 5mg  daily. ? ?Echo 04/23: EF ~ 45% (Dr. Ivie Savitt's review), RV okay ? ?Here today for follow-up. Recently returned from a trip to the beach. Typically weights 118 lb >> up to 123.5 lb after vacation. Attributes weight gain to her diet on vacation. Now down to 120 lb. Had a little bit of dyspnea while digging holes in the garden yesterday but otherwise no significant SOB. No orthopnea, PND or leg edema. Notes a little bit of abdominal bloating. Occasional lightheadedness. Compliant with  medications. SBP typically averages in 100s. ? ? ?Past Medical History:  ?Diagnosis Date  ? CHF (congestive heart failure) ([place])   ? Hyperlipidemia   ? Hypothyroidism   ? ? ?Current Outpatient Medications  ?Medication Sig Dispense Refill  ? alum & mag hydroxide-simeth (MAALOX/MYLANTA) 200-200-20 MG/5ML suspension Take 30 mLs by mouth every 6 (six) hours as needed for indigestion or heartburn.    ? calcium carbonate (TUMS - DOSED IN MG ELEMENTAL CALCIUM) 500 MG chewable tablet Chew 1-2 tablets by mouth as needed for indigestion or heartburn.    ? carvedilol (COREG) 3.125 MG tablet Take 1 tablet (3.125 mg total) by mouth 2 (two) times daily with a meal. 60 tablet 3  ? cholecalciferol (VITAMIN D3) 25 MCG (1000 UNIT) tablet Take 1,000 Units by mouth daily.    ? ENTRESTO 24-26 MG TAKE 1 TABLET BY MOUTH TWICE DAILY 60 tablet 10  ? FARXIGA 5 MG TABS tablet TAKE 1 TABLET(5 MG) BY MOUTH DAILY 30 tablet 11  ? Multiple Vitamins-Minerals (MULTIVITAMIN WITH MINERALS) tablet Take 1 tablet by mouth daily.    ? simethicone (MYLICON) 0000000 MG chewable tablet Chew 125 mg by mouth every 6 (six) hours as needed for flatulence.    ? vitamin C (ASCORBIC ACID) 500 MG tablet Take 500 mg by mouth daily.    ? ?No current facility-administered medications for this encounter.  ? ? ?Allergies  ?Allergen Reactions  ? Codeine Itching  ? Sulfacetamide Sodium Diarrhea  ? ? ?  ?  Social History  ? ?Socioeconomic History  ? Marital status: Married  ?  Spouse name: Not on file  ? Number of children: Not on file  ? Years of education: Not on file  ? Highest education level: Not on file  ?Occupational History  ? Not on file  ?Tobacco Use  ? Smoking status: Never  ? Smokeless tobacco: Never  ?Substance and Sexual Activity  ? Alcohol use: Yes  ?  Comment: socially  ? Drug use: Never  ? Sexual activity: Yes  ?  Partners: Male  ?Other Topics Concern  ? Not on file  ?Social History Narrative  ? Not on file  ? ?Social Determinants of Health  ? ?Financial Resource  Strain: Not on file  ?Food Insecurity: Not on file  ?Transportation Needs: Not on file  ?Physical Activity: Not on file  ?Stress: Not on file  ?Social Connections: Not on file  ?Intimate Partner Violence: Not on file  ? ? ?  ?Family History  ?Problem Relation Age of Onset  ? Hypertension Mother   ? Heart disease Father   ?     s/p stent and pacemaker placed in his 71s  ? Diabetes Mellitus II Father   ? ? ?Vitals:  ? 01/25/22 1423  ?BP: 138/88  ?Pulse: 65  ?SpO2: 94%  ?Weight: 55.3 kg (122 lb)  ? ? ? ? ?PHYSICAL EXAM: ?General:  No distress. Ambulated into clinic. Husband present. ?HEENT: normal ?Neck: supple. no JVD. Carotids 2+ bilat; no bruits.  ?Cor: PMI nondisplaced. Regular rate & rhythm. No rubs, gallops or murmurs. ?Lungs: clear ?Abdomen: soft, nontender, nondistended.  ?Extremities: no cyanosis, clubbing, rash, edema ?Neuro: alert & orientedx3, cranial nerves grossly intact. moves all 4 extremities w/o difficulty. Affect pleasant ? ? ?ASSESSMENT & PLAN: ? ?1. 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 ?- Echo 12/21 EF 25 to 30%, severe LV dilatation, normal RV function, mild to moderate MR, mild AI.   ?- Cath 12/21 normal coronary arteries ?- Zio 09/2020 showed an 8 beat run of NSVT, and 21 runs of SVT with longest lasting 18 beats, frequent PVCs (6.1% with 3 morphologies. Highest daily burden 8.7%) ?- 28 day event monitor 1/22:  PVCs 2% ?- cMRI 11/13/20: LVEF 35% RVEF 58%. Basal septal midwall LGE and RV insertion LGE ?- Echo 01/07/21: EF 30-35% RV normal mild AI   ?- Echo 05/31/21: EF 35-40% Personally reviewed ?- Echo 04/23: EF 45% Personally reviewed by Dr. Haroldine Laws. ?- Etiology unclear. 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 Has seen Dr. Rayann Heman who felt PVC s result of CM and not cause ?- NYHA II. Volume looks okay on exam. ReDS 27% ?- Continue carvedilol 3.125 bid (Previously decreased due to low BP) ?- Continue Entresto 24-26 mg twice daily.   ?- Off  spiro due to hypotension. Does not want to retrial, made her feel poorly. This is reasonable since EF is continuing to improve. ?- Continue Farxiga 5mg  daily ?- Labs today ? ?2. Frequent PVCs:  ?- Zio patch x 3 days on 09/29/2020 showed an 8 beat run of NSVT, and 21 runs of SVT with longest lasting 18 beats, frequent PVCs (6.1% with 3 morphologies. Highest daily burden 8.7%) ?- 28 day event monitor 1/22:  PVCs 2% ?- Intolerant mexilitene after a couple of doses due to CP. Refused amio ?- Saw Dr. Rayann Heman in 3/22 who felt PVCs the result of her CM and not  cause and did not think she required AAD or ablation (PVCs felt to be in LV) ? ?3. Hypothyroidism ?- TSH normal 06/22  ? ?Follow-up: 1 year with echo, she will call sooner with concerns. ? ?FINCH, LINDSAY N, PA-C  ?3:14 PM ? ?Patient seen and examined with the above-signed Advanced Practice Provider and/or Housestaff. I personally reviewed laboratory data, imaging studies and relevant notes. I independently examined the patient and formulated the important aspects of the plan. I have edited the note to reflect any of my changes or salient points. I have personally discussed the plan with the patient and/or family. ? ?She is doing well. Still feels bloated and SOB at times but able to do all activities without too much trouble.  ? ?Echo today EF 45% ? ?REDS 27% ? ?General:  Well appearing. No resp difficulty ?HEENT: normal ?Neck: supple. no JVD. Carotids 2+ bilat; no bruits. No lymphadenopathy or thryomegaly appreciated. ?Cor: PMI nondisplaced. Regular rate & rhythm. No rubs, gallops or murmurs. ?Lungs: clear ?Abdomen: soft, nontender, nondistended. No hepatosplenomegaly. No bruits or masses. Good bowel sounds. ?Extremities: no cyanosis, clubbing, rash, edema ?Neuro: alert & orientedx3, cranial nerves grossly intact. moves all 4 extremities w/o difficulty. Affect pleasant ? ?NYHA II. EF improving slowly with PVC suppression. Discussed adding spiro but she was against  this. Will check labs. Emphasized need not to overdue diuretics as she often feels bloated when fluid level is ok.  ? ?Glori Bickers, MD  ?10:31 PM ? ?

## 2022-01-25 NOTE — Patient Instructions (Signed)
Medication Changes: ? ?none ? ?Lab Work: ? ?Labs done today, your results will be available in MyChart, we will contact you for abnormal readings. ? ? ?Testing/Procedures: ? ?Your physician has requested that you have an echocardiogram. Echocardiography is a painless test that uses sound waves to create images of your heart. It provides your doctor with information about the size and shape of your heart and how well your heart?s chambers and valves are working. This procedure takes approximately one hour. There are no restrictions for this procedure. IN ONE YEAR. CALL us TO SCHEDULE THIS APPOINTMENT IN February 2024.  ? ? ?Referrals: ? ?none ? ?Special Instructions // Education: ? ?Do the following things EVERYDAY: ?Weigh yourself in the morning before breakfast. Write it down and keep it in a log. ?Take your medicines as prescribed ?Eat low salt foods--Limit salt (sodium) to 2000 mg per day.  ?Stay as active as you can everyday ?Limit all fluids for the day to less than 2 liters ? ? ?Follow-Up in: 1 year. Call us in February 2024 to schedule this appointment.  ? ?At the [facility], you and your health needs are our priority. We have a designated team specialized in the treatment of Heart Failure. This Care Team includes your primary Heart Failure Specialized Cardiologist (physician), Advanced Practice Providers (APPs- Physician Assistants and Nurse Practitioners), and Pharmacist who all work together to provide you with the care you need, when you need it.  ? ?You may see any of the following providers on your designated Care Team at your next follow up: ? ?Dr Arvilla Meres ?Dr Marca Ancona ?Tonye Becket, NP ?Robbie Lis, PA ?Jessica Milford,NP ?Anna Genre, PA ?Karle Plumber, PharmD ? ? ?Please be sure to bring in all your medications bottles to every appointment.  ? ?Need to Contact us: ? ?If you have any questions or concerns before your next appointment please send [place] a message through  [place] or call our office at [phone].   ? ?TO LEAVE A MESSAGE FOR THE NURSE SELECT OPTION 2, PLEASE LEAVE A MESSAGE INCLUDING: ?YOUR NAME ?DATE OF BIRTH ?CALL BACK NUMBER ?REASON FOR CALL**this is important as we prioritize the call backs ? ?YOU WILL RECEIVE A CALL BACK THE SAME DAY AS LONG AS YOU CALL BEFORE 4:00 PM ? ? ?

## 2022-03-11 ENCOUNTER — Telehealth (HOSPITAL_COMMUNITY): Payer: Self-pay | Admitting: *Deleted

## 2022-03-11 NOTE — Telephone Encounter (Signed)
Pt left vm stating her abdomen and fingers are swollen. Pt said her weight is stable and she is not short of breath. Pt asked if her meds needed to be adjusted.   Routed to Dr.Daniel Bensimhon

## 2022-03-14 NOTE — Telephone Encounter (Signed)
Left vm for return call

## 2022-03-27 ENCOUNTER — Other Ambulatory Visit: Payer: Self-pay | Admitting: Internal Medicine

## 2022-03-29 NOTE — Telephone Encounter (Signed)
This is a CHF pt 

## 2022-05-23 IMAGING — CR DG CHEST 2V
2 series · 2 of 2 positions shown · non-contrast
Comparison: None.

CLINICAL DATA: Chest pain

EXAM:
CHEST - 2 VIEW

[w chest pa]
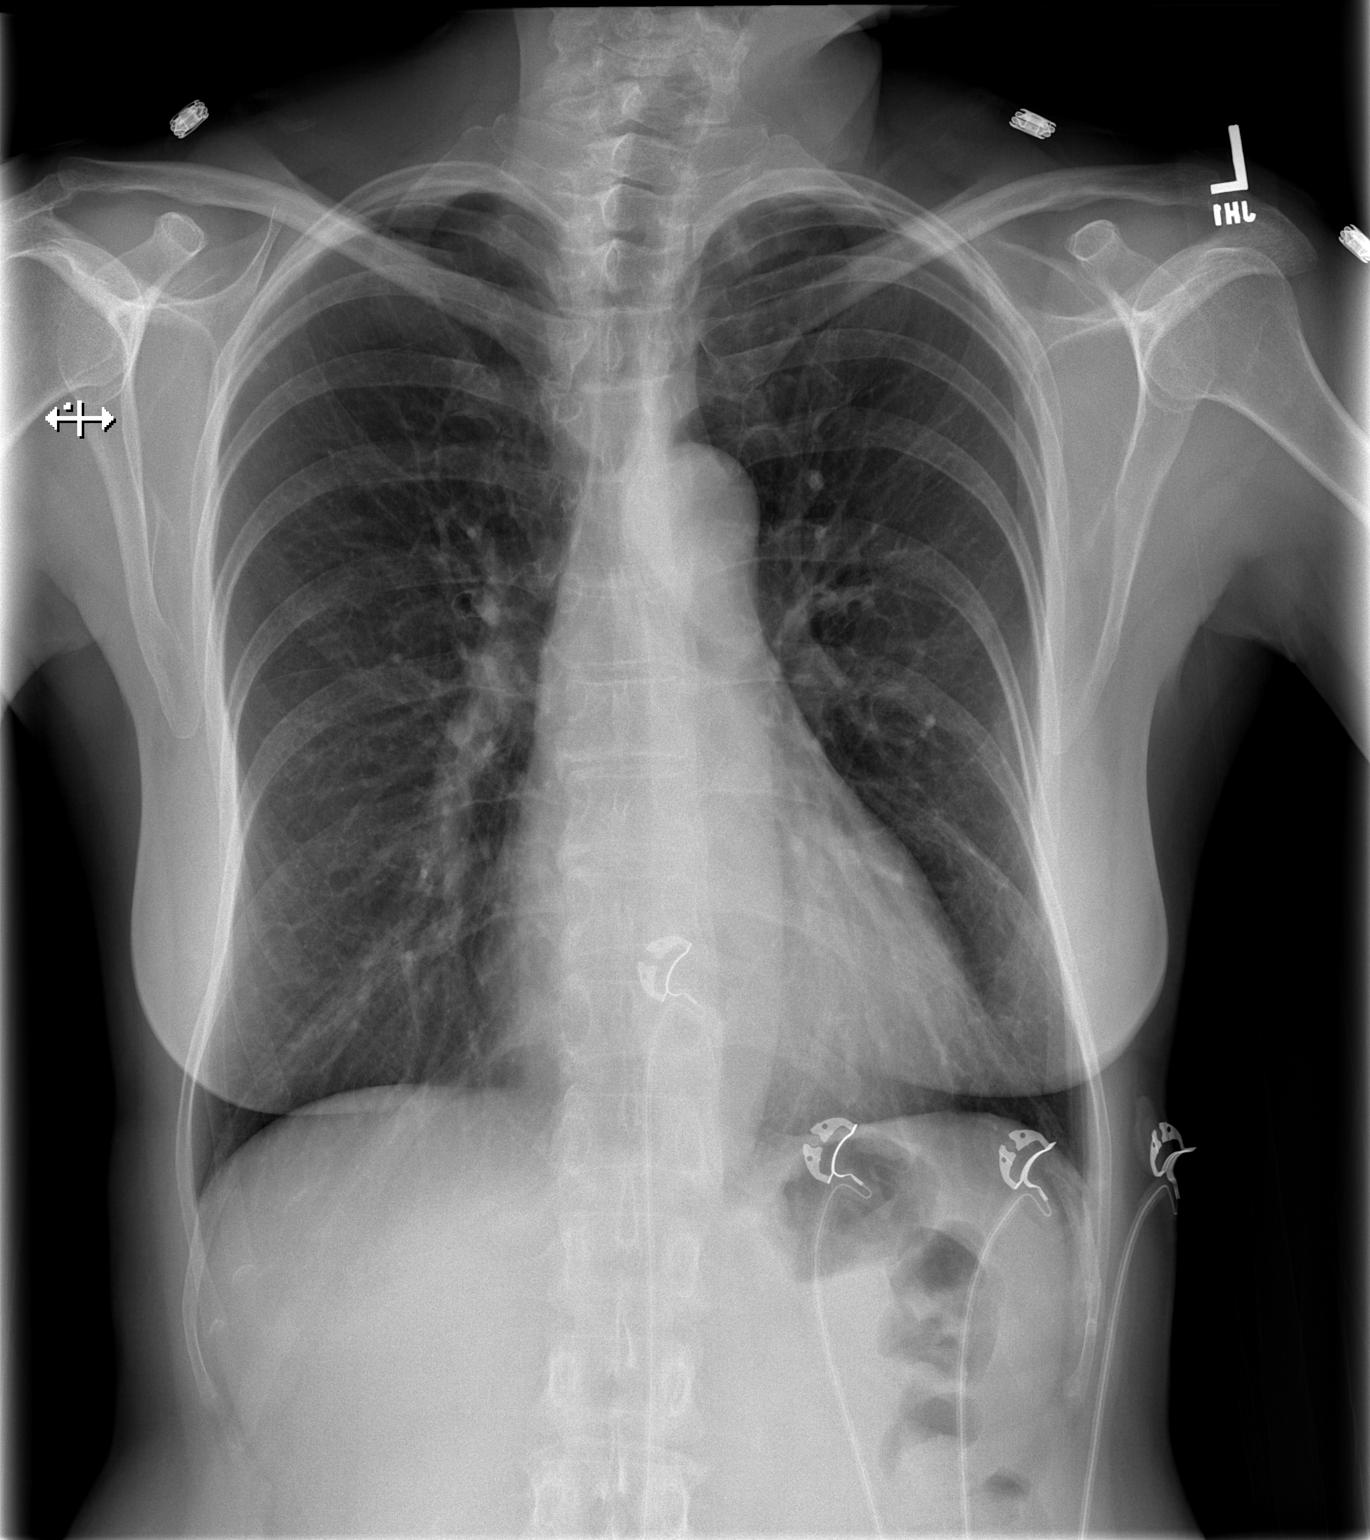

[w chest lat]
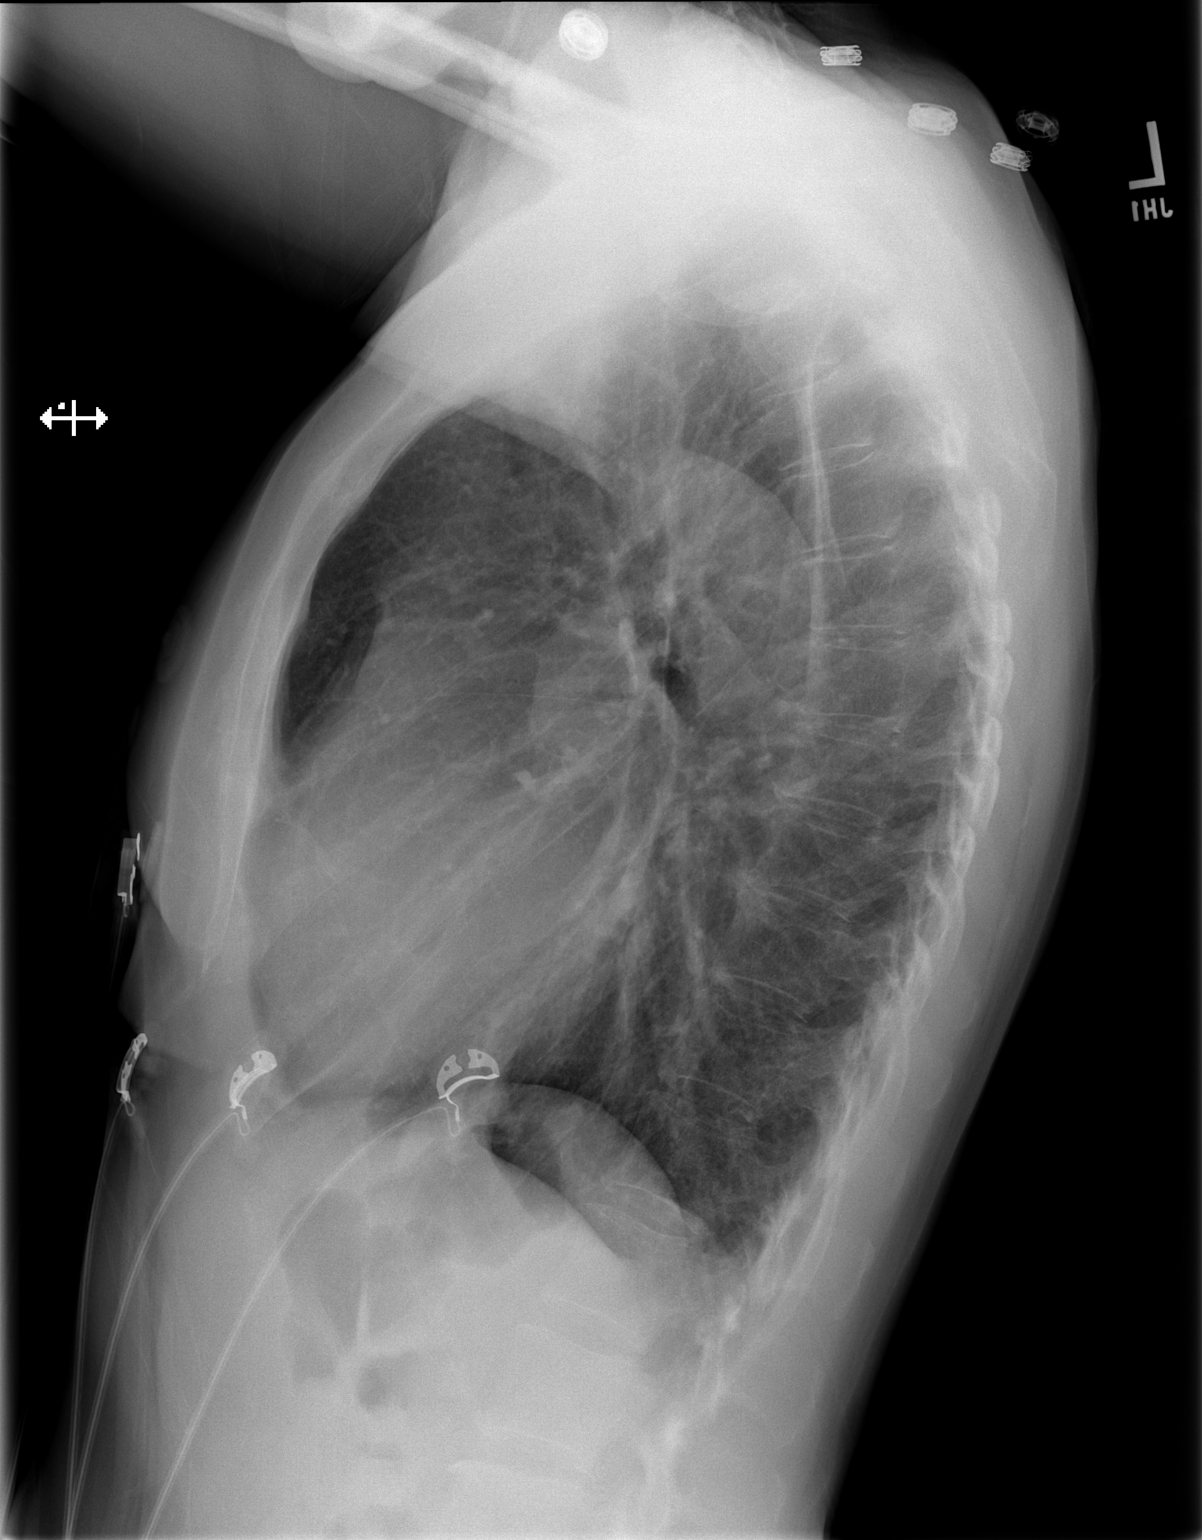

[2 of 2 positions shown; findings below may reference images not displayed]

FINDINGS: Borderline cardiomegaly. Negative aortic and hilar contours. There
is no edema, consolidation, effusion, or pneumothorax.
IMPRESSION: No evidence of active disease.

## 2022-06-14 IMAGING — DX DG CHEST 2V
2 series · 2 of 2 positions shown · non-contrast
Comparison: 09/26/2020

CLINICAL DATA: Racing heart.  Dizziness.

EXAM:
CHEST - 2 VIEW

[chest pa]
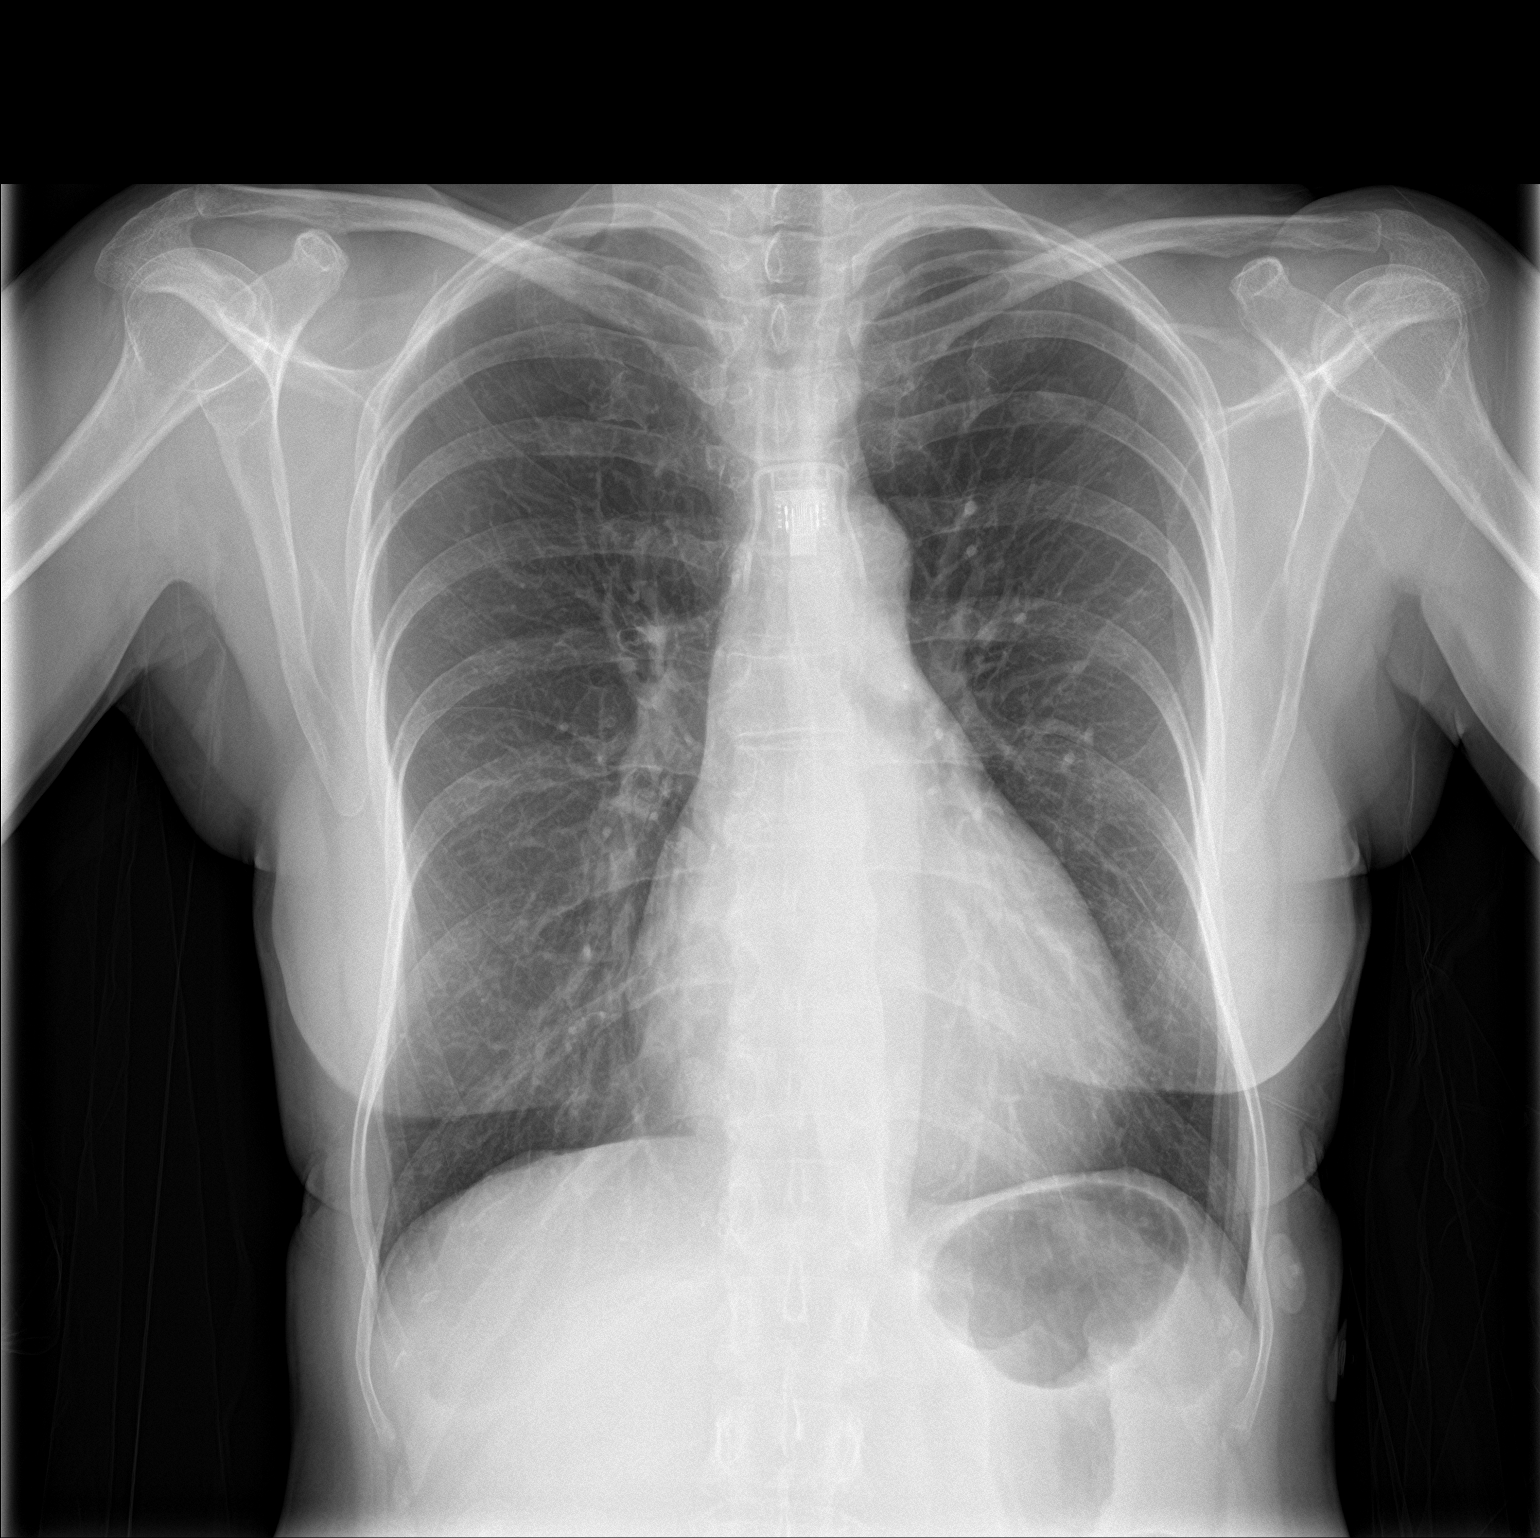

[chest lat]
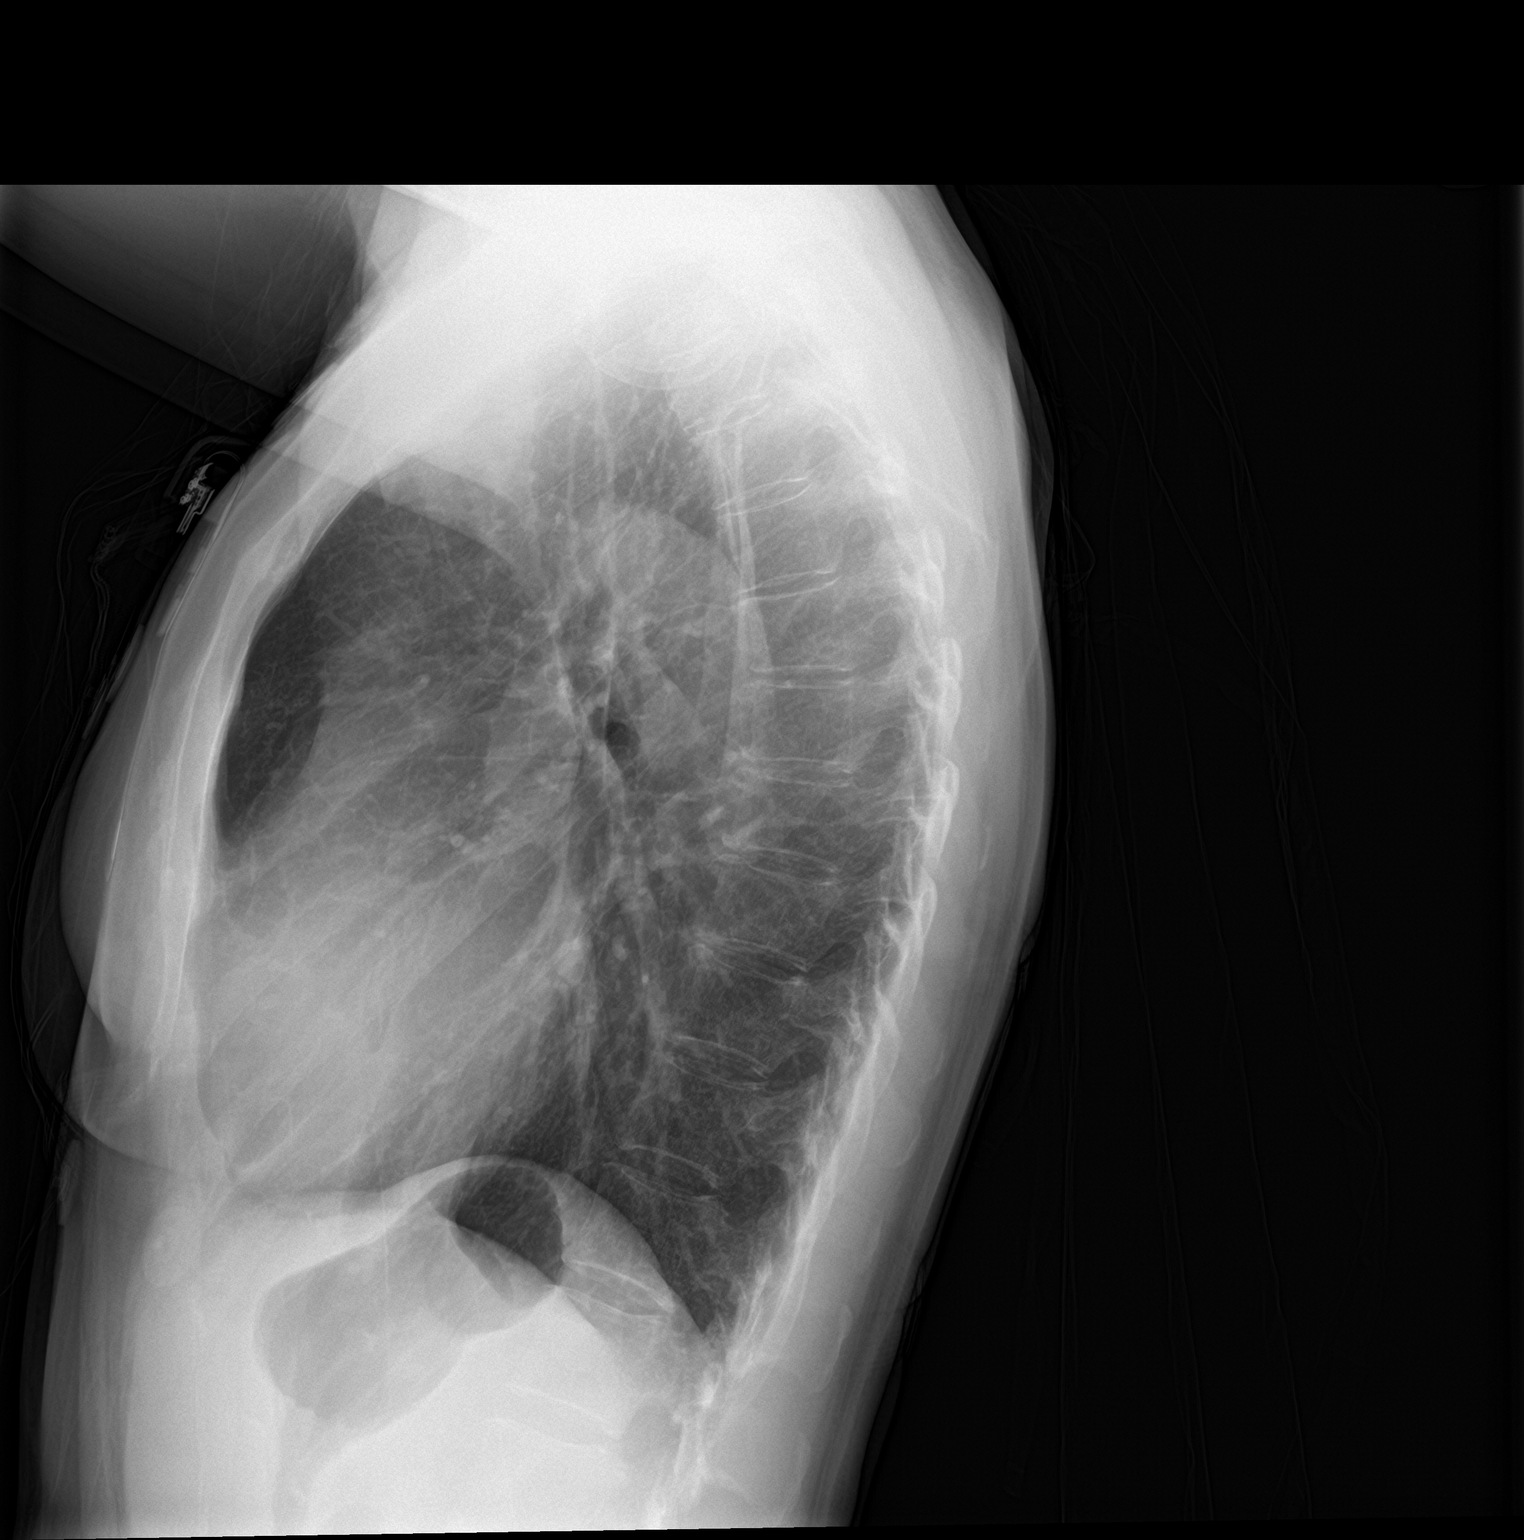

[2 of 2 positions shown; findings below may reference images not displayed]

FINDINGS: There is cardiomegaly. No pneumothorax. No large pleural effusion or
focal infiltrate. No acute osseous abnormality.
IMPRESSION: Cardiomegaly without edema or focal infiltrate.

## 2022-10-27 ENCOUNTER — Telehealth (HOSPITAL_COMMUNITY): Payer: Self-pay

## 2022-10-27 NOTE — Telephone Encounter (Signed)
She called and stated her BP has been dropping into the 80's/50's range after she takes her medication. She stated she stopped her morning dose of Carvedilol and is taking her Entresto at lunch. When it comes back up it stays in the 100's/60's range. She stated when she takes her [place] she notices back pain, arm pain as well. She is not sure if it is related to her [place]ade or if other medication changes need to be made.  Please advise.

## 2022-10-28 ENCOUNTER — Other Ambulatory Visit: Payer: Self-pay | Admitting: Cardiology

## 2022-10-31 ENCOUNTER — Other Ambulatory Visit (HOSPITAL_COMMUNITY): Payer: Self-pay

## 2022-10-31 MED ORDER — LOSARTAN POTASSIUM 25 MG PO TABS: 25.0000 mg | ORAL_TABLET | Freq: Every day | ORAL | 2 refills | Status: DC

## 2022-10-31 NOTE — Telephone Encounter (Signed)
Spoke to patient and gave her information. Prescription called into pharmacy

## 2022-11-03 ENCOUNTER — Telehealth (HOSPITAL_COMMUNITY): Payer: Self-pay | Admitting: *Deleted

## 2022-11-03 NOTE — Telephone Encounter (Signed)
Pt called stating she tolerated med changes for two days but this morning she woke up with blurry vision and her bp was 142/72 she took her meds and bp is now 107/54. Pt asked if she should continue losartan or go back to entresto.   Routed to Dr. Sharlotte Alamo

## 2022-11-04 NOTE — Telephone Encounter (Signed)
Pt aware.

## 2022-11-14 ENCOUNTER — Other Ambulatory Visit: Payer: Self-pay

## 2022-11-14 MED ORDER — CARVEDILOL 3.125 MG PO TABS: 3.1250 mg | ORAL_TABLET | Freq: Two times a day (BID) | ORAL | 0 refills | Status: DC

## 2022-12-13 ENCOUNTER — Other Ambulatory Visit (HOSPITAL_COMMUNITY): Payer: Self-pay

## 2022-12-13 DIAGNOSIS — I5022 Chronic systolic (congestive) heart failure: Secondary | ICD-10-CM

## 2023-01-05 ENCOUNTER — Other Ambulatory Visit (HOSPITAL_COMMUNITY): Payer: Self-pay | Admitting: Cardiology

## 2023-01-05 MED ORDER — DAPAGLIFLOZIN PROPANEDIOL 5 MG PO TABS: ORAL_TABLET | ORAL | 2 refills | Status: DC

## 2023-01-05 MED ORDER — LOSARTAN POTASSIUM 25 MG PO TABS: 25.0000 mg | ORAL_TABLET | Freq: Every day | ORAL | 2 refills | Status: DC

## 2023-02-04 IMAGING — US US ABDOMEN LIMITED
1 series · 14 of 25 positions shown · non-contrast
Comparison: None.

CLINICAL DATA: Possible gallstone.

EXAM:
ULTRASOUND ABDOMEN LIMITED RIGHT UPPER QUADRANT

[Series 1: us abdomen limited ruq (liver/gb) · 14 of 61 slices shown]
[im 1/61]
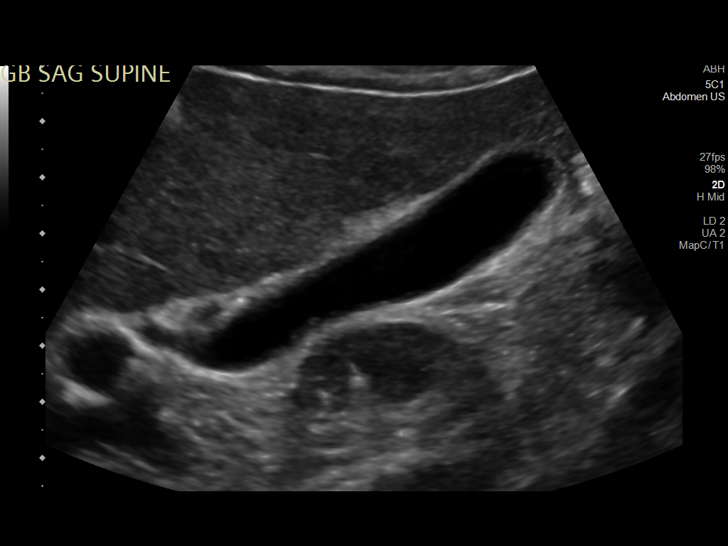
[im 6/61]
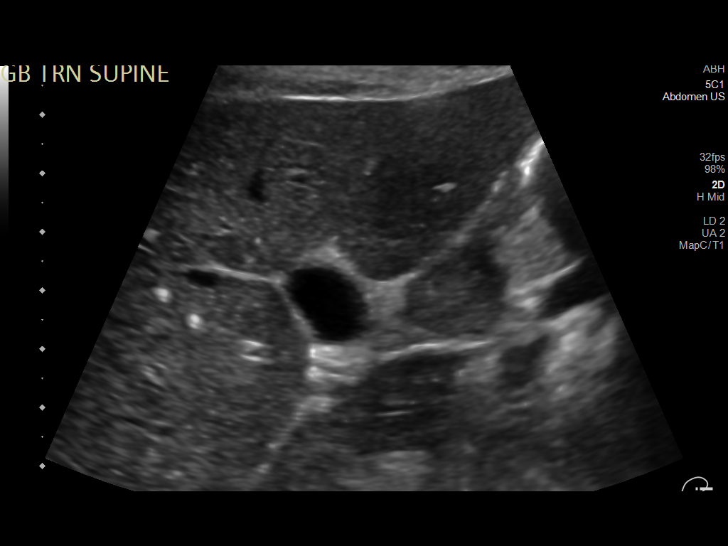
[im 11/61]
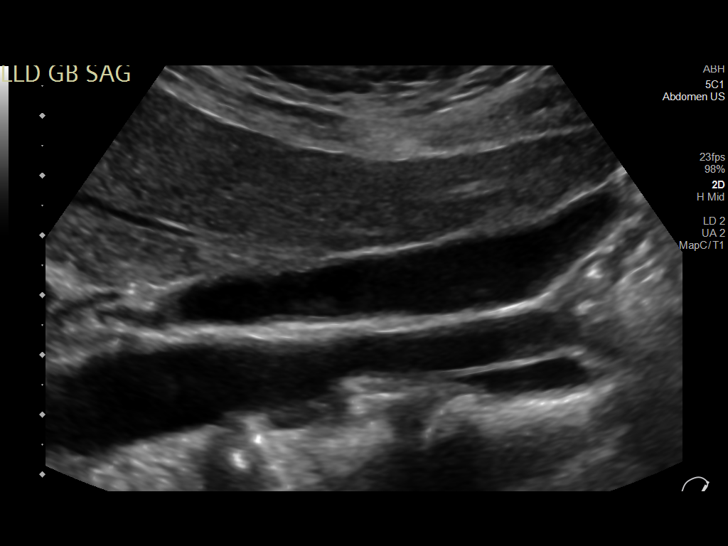
[im 16/61]
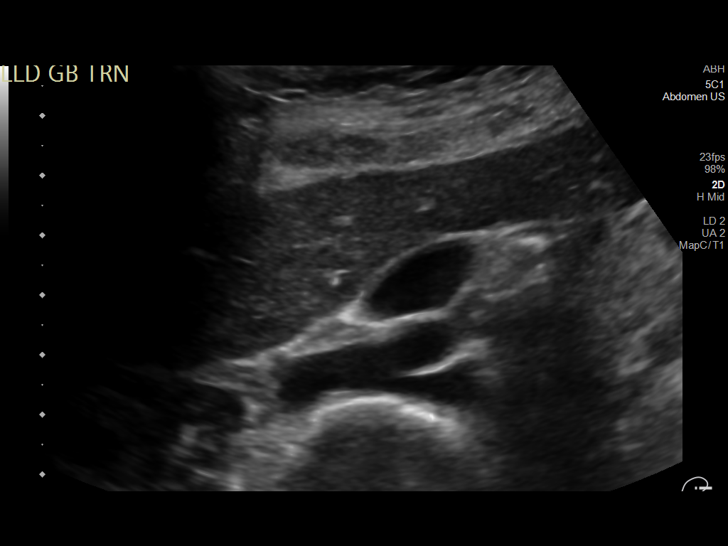
[im 21/61]
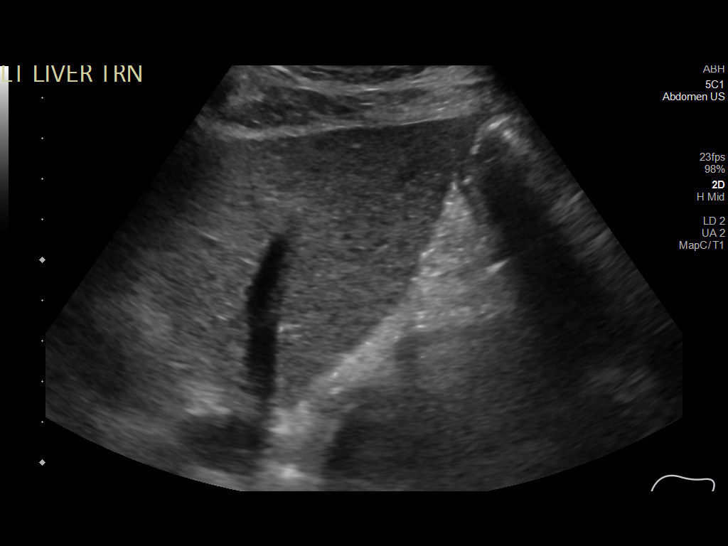
[im 23/61]
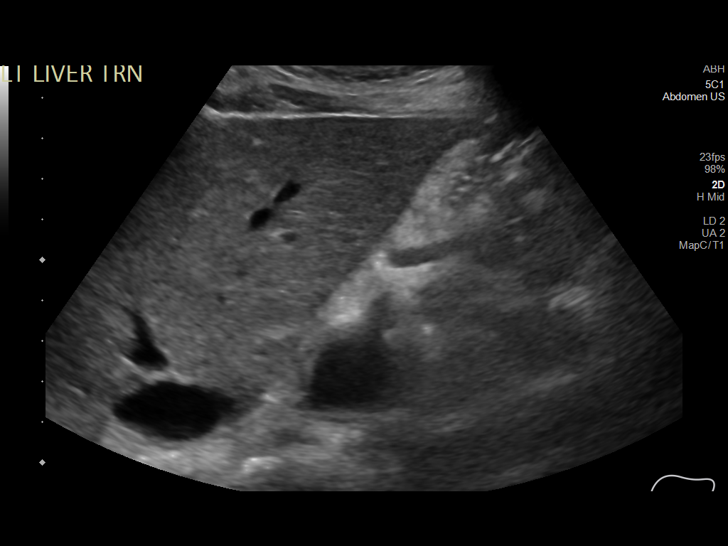
[im 28/61]
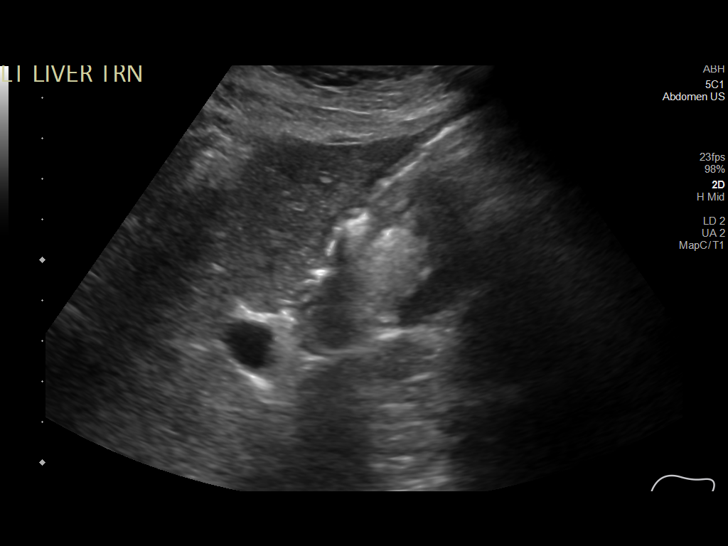
[im 33/61]
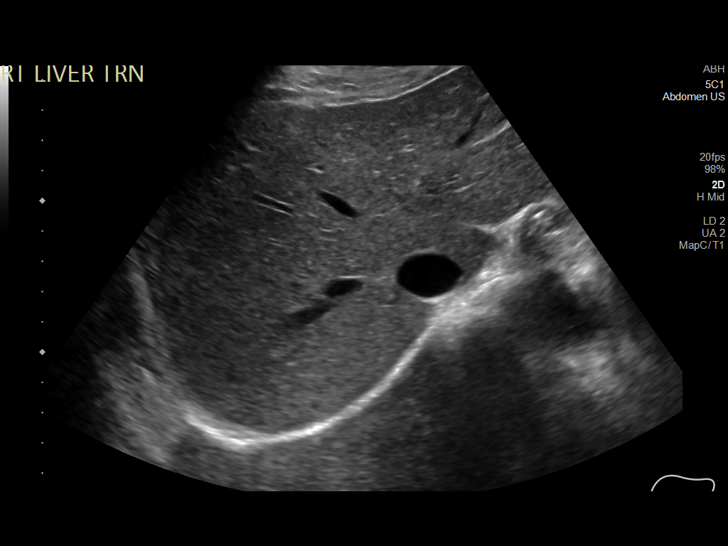
[im 38/61]
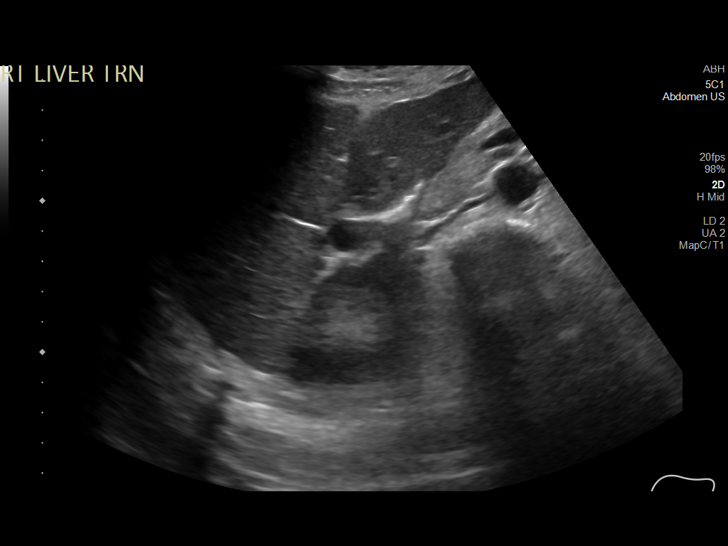
[im 41/61]
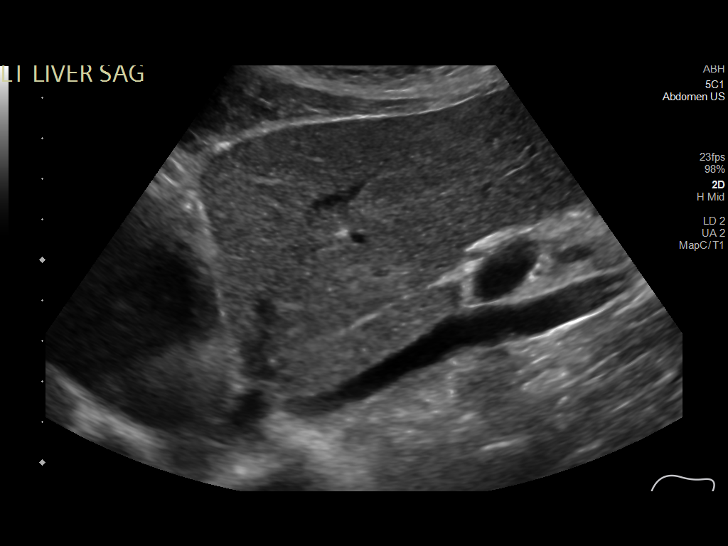
[im 46/61]
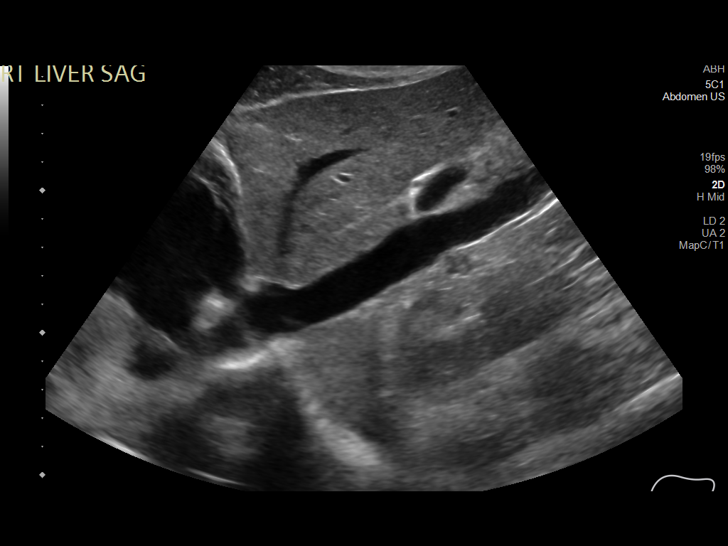
[im 51/61]
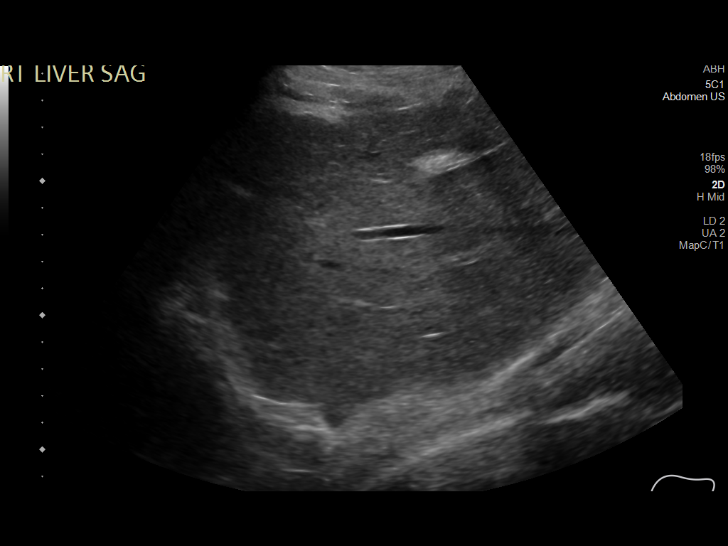
[im 56/61]
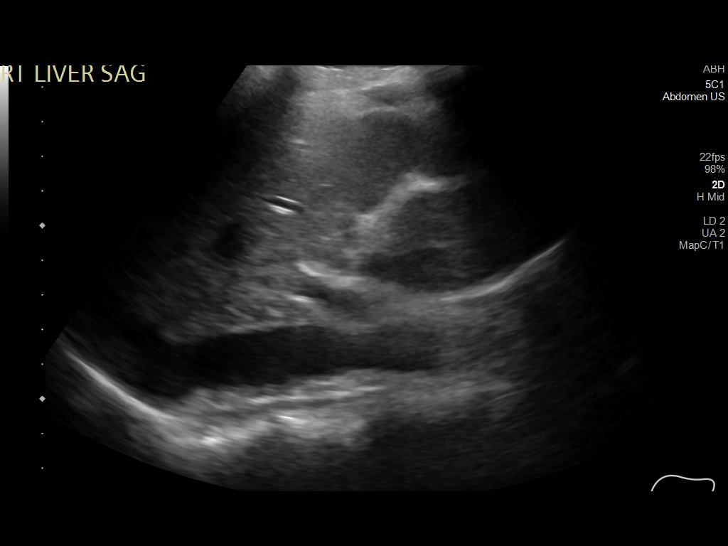
[im 61/61]
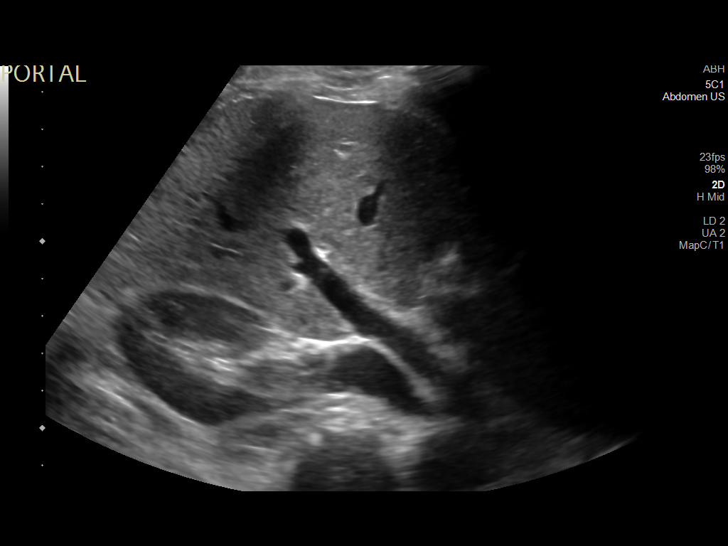

[14 of 25 positions shown; findings below may reference images not displayed]

FINDINGS: Gallbladder:

No gallstones or wall thickening visualized. No sonographic Murphy
sign noted by sonographer.

Common bile duct:

Diameter: 3 mm

Liver:

No focal lesion identified. Within normal limits in parenchymal
echogenicity. Portal vein is patent on color Doppler imaging with
normal direction of blood flow towards the liver.

Other: None.
IMPRESSION: Unremarkable right upper quadrant ultrasound.

## 2023-02-06 NOTE — Progress Notes (Signed)
ADVANCED HF CLINIC NOTE  Referring Physician: Dr. Bjorn Pippin Primary Care: Mitzi Hansen, NP Primary Cardiologist: Little Ishikawa, MD  HPI: Janet Ramirez is a 62 yo woman with hypothyroidism, HL, PVCs and systolic HF   No known h/o of heart problems until 12/21 -> admitted with CP and mildly elevated hstrop. Echo 12/21 EF 25-30%, severe LV dilatation, normal RV function, mild to moderate MR, mild AI.  Cath showed normal coronary arteries with well-compensated hemodynamics, 3-day Zio showed one 8 beat run of NSVT, and 21 runs of SVT with longest lasting 18 beats, frequent PVCs (6.1%).  cMRI 11/13/20: 1. LVEF 35% 2.  Normal RV size and systolic function (EF 58%) 3. Basal septal midwall LGE and RV insertion LGE, which is a scar pattern seen in nonischemic cardiomyopathies and associated with worse prognosis 4.  Mild aortic regurgitation, regurgitant fraction 9%  28 day event monitor 1/22:  PVCs 2%  She was seen for first time 11/26/20  EKG with high PVC burden. 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 Developed CP so stopped. Later decided on short trial of amio but patient refused due to potential SEs.   She saw Dr. Johney Frame in 3/22 who felt PVCs the result of her CM and not cause and did not think she required AAD or ablation (PVCs felt to be in LV)  Echo 01/07/21: EF 30-35% RV normal mild AI    Echo 05/31/21: EF 35-40%   Wasn't able to tolerate Farxiga 10 due to migraines. Now tolerating 5mg  daily.  Echo 04/23: EF ~ 45% RV okay  Here today for follow-up. Recently returned from a trip to the beach. Typically weights 118 lb >> up to 123.5 lb after vacation. Attributes weight gain to her diet on vacation. Now down to 120 lb. Had a little bit of dyspnea while digging holes in the garden yesterday but otherwise no significant SOB. No orthopnea, PND or leg edema. Notes a little bit of abdominal bloating. Occasional lightheadedness. Compliant with medications. SBP typically  averages in 100s.   Past Medical History:  Diagnosis Date   CHF (congestive heart failure) ([facility])    Hyperlipidemia    Hypothyroidism     Current Outpatient Medications  Medication Sig Dispense Refill   alum & mag hydroxide-simeth (MAALOX/MYLANTA) 200-200-20 MG/5ML suspension Take 30 mLs by mouth every 6 (six) hours as needed for indigestion or heartburn.     calcium carbonate (TUMS - DOSED IN MG ELEMENTAL CALCIUM) 500 MG chewable tablet Chew 1-2 tablets by mouth as needed for indigestion or heartburn.     carvedilol (COREG) 3.125 MG tablet Take 1 tablet (3.125 mg total) by mouth 2 (two) times daily with a meal. 60 tablet 3   cholecalciferol (VITAMIN D3) 25 MCG (1000 UNIT) tablet Take 1,000 Units by mouth daily.     ENTRESTO 24-26 MG TAKE 1 TABLET BY MOUTH TWICE DAILY 60 tablet 10   FARXIGA 5 MG TABS tablet TAKE 1 TABLET(5 MG) BY MOUTH DAILY 30 tablet 11   Multiple Vitamins-Minerals (MULTIVITAMIN WITH MINERALS) tablet Take 1 tablet by mouth daily.     simethicone (MYLICON) 125 MG chewable tablet Chew 125 mg by mouth every 6 (six) hours as needed for flatulence.     vitamin C (ASCORBIC ACID) 500 MG tablet Take 500 mg by mouth daily.     No current facility-administered medications for this encounter.    Allergies  Allergen Reactions   Codeine Itching   Sulfacetamide Sodium Diarrhea  Social History   Socioeconomic History   Marital status: Married    Spouse name: Not on file   Number of children: Not on file   Years of education: Not on file   Highest education level: Not on file  Occupational History   Not on file  Tobacco Use   Smoking status: Never   Smokeless tobacco: Never  Substance and Sexual Activity   Alcohol use: Yes    Comment: socially   Drug use: Never   Sexual activity: Yes    Partners: Male  Other Topics Concern   Not on file  Social History Narrative   Not on file   Social Determinants of Health   Financial Resource Strain: Not on file  Food  Insecurity: Not on file  Transportation Needs: Not on file  Physical Activity: Not on file  Stress: Not on file  Social Connections: Not on file  Intimate Partner Violence: Not on file      Family History  Problem Relation Age of Onset   Hypertension Mother    Heart disease Father        s/p stent and pacemaker placed in his 62s   Diabetes Mellitus II Father     Vitals:   01/25/22 1423  BP: 138/88  Pulse: 65  SpO2: 94%  Weight: 55.3 kg (122 lb)      PHYSICAL EXAM: General:  No distress. Ambulated into clinic. Husband present. HEENT: normal Neck: supple. no JVD. Carotids 2+ bilat; no bruits.  Cor: PMI nondisplaced. Regular rate & rhythm. No rubs, gallops or murmurs. Lungs: clear Abdomen: soft, nontender, nondistended.  Extremities: no cyanosis, clubbing, rash, edema Neuro: alert & orientedx3, cranial nerves grossly intact. moves all 4 extremities w/o difficulty. Affect pleasant   ASSESSMENT & PLAN:  1. 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 - Echo 12/21 EF 25-30%, severe LV dilatation, normal RV, mild-mod MR, mild AI.   - Cath 12/21 normal coronary arteries - Zio 12/21 8 beat run NSVT, 21 runs of SVT, frequent PVCs (6.1%)with 3 morphologies.  - 28 day event monitor 1/22:  PVCs 2% - cMRI 2/22: LVEF 35% RVEF 58%. Basal septal midwall LGE and RV insertion LGE - Echo 01/07/21: EF 30-35% RV normal mild AI   - Echo 05/31/21: EF 35-40%  - Echo 04/23: EF 45%  - Etiology unclear. 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 Has seen Dr. Johney Frame who felt PVC s result of CM and not cause - NYHA II. Volume looks okay on exam.  - Continue carvedilol 3.125 bid (Previously decreased due to low BP) - Continue Entresto 24-26 mg twice daily.   - Off spiro due to hypotension. Does not want to retrial, made her feel poorly.  - Continue Farxiga 5mg  daily - - If EF not improving fully consider PET scan to r/o sarcoid vs genetic testing  to evaluate for LMNA - Labs today  2. Frequent PVCs:  - Zio patch x 3 days on 09/29/2020 showed an 8 beat run of NSVT, and 21 runs of SVT with longest lasting 18 beats, frequent PVCs (6.1% with 3 morphologies. Highest daily burden 8.7%) - 28 day event monitor 1/22:  PVCs 2% - Intolerant mexilitene after a couple of doses due to CP. Refused amio - Saw Dr. Johney Frame in 3/22 who felt PVCs the result of her CM and not cause and did not think she required AAD or ablation (PVCs felt  to be in LV)  3. Hypothyroidism - TSH normal 06/22   Arvilla Meres, MD  11:55 PM

## 2023-02-07 ENCOUNTER — Ambulatory Visit (HOSPITAL_BASED_OUTPATIENT_CLINIC_OR_DEPARTMENT_OTHER)
Admission: RE | Admit: 2023-02-07 | Discharge: 2023-02-07 | Disposition: A | Discharge status: Home / Self Care | Payer: BC Managed Care – PPO | Source: Ambulatory Visit | Attending: Internal Medicine | Admitting: Internal Medicine

## 2023-02-07 ENCOUNTER — Other Ambulatory Visit (HOSPITAL_COMMUNITY): Payer: Self-pay | Admitting: Internal Medicine

## 2023-02-07 ENCOUNTER — Encounter (HOSPITAL_COMMUNITY): Payer: Self-pay | Admitting: Internal Medicine

## 2023-02-07 ENCOUNTER — Inpatient Hospital Stay (HOSPITAL_COMMUNITY)
Admission: RE | Admit: 2023-02-07 | Discharge: 2023-02-07 | Disposition: A | Discharge status: Home / Self Care | Payer: BC Managed Care – PPO | Source: Ambulatory Visit | Attending: Internal Medicine | Admitting: Internal Medicine

## 2023-02-07 ENCOUNTER — Ambulatory Visit (HOSPITAL_COMMUNITY)
Admission: RE | Admit: 2023-02-07 | Discharge: 2023-02-07 | Disposition: A | Discharge status: Home / Self Care | Payer: BC Managed Care – PPO | Source: Ambulatory Visit | Attending: Internal Medicine | Admitting: Internal Medicine

## 2023-02-07 VITALS — BP 142/80 | HR 56 | Wt 123.8 lb

## 2023-02-07 DIAGNOSIS — I5042 Chronic combined systolic (congestive) and diastolic (congestive) heart failure: Secondary | ICD-10-CM | POA: Diagnosis not present

## 2023-02-07 DIAGNOSIS — I252 Old myocardial infarction: Secondary | ICD-10-CM | POA: Diagnosis not present

## 2023-02-07 DIAGNOSIS — I5022 Chronic systolic (congestive) heart failure: Secondary | ICD-10-CM | POA: Diagnosis not present

## 2023-02-07 DIAGNOSIS — I959 Hypotension, unspecified: Secondary | ICD-10-CM | POA: Diagnosis not present

## 2023-02-07 DIAGNOSIS — E785 Hyperlipidemia, unspecified: Secondary | ICD-10-CM | POA: Insufficient documentation

## 2023-02-07 DIAGNOSIS — E039 Hypothyroidism, unspecified: Secondary | ICD-10-CM | POA: Insufficient documentation

## 2023-02-07 DIAGNOSIS — I493 Ventricular premature depolarization: Secondary | ICD-10-CM | POA: Insufficient documentation

## 2023-02-07 DIAGNOSIS — Z79899 Other long term (current) drug therapy: Secondary | ICD-10-CM | POA: Insufficient documentation

## 2023-02-07 DIAGNOSIS — R079 Chest pain, unspecified: Secondary | ICD-10-CM | POA: Insufficient documentation

## 2023-02-07 DIAGNOSIS — I428 Other cardiomyopathies: Secondary | ICD-10-CM | POA: Insufficient documentation

## 2023-02-07 LAB — BASIC METABOLIC PANEL
Anion gap: 10 (ref 5–15)
BUN: 11 mg/dL (ref 8–23)
CO2: 27 mmol/L (ref 22–32)
Calcium: 9.3 mg/dL (ref 8.9–10.3)
Chloride: 99 mmol/L (ref 98–111)
Creatinine, Ser: 0.77 mg/dL (ref 0.44–1.00)
GFR, Estimated: >60 mL/min (ref >60)
Glucose, Bld: 105 mg/dL — ABNORMAL HIGH (ref 70–99)
Potassium: 4 mmol/L (ref 3.5–5.1)
Sodium: 136 mmol/L (ref 135–145)

## 2023-02-07 LAB — ECHOCARDIOGRAM COMPLETE
AR max vel: 2.18 cm2
AV Area VTI: 2.25 cm2
AV Area mean vel: 1.99 cm2
AV Mean grad: 3 mmHg
AV Peak grad: 4.6 mmHg
AV Vena cont: 0.4 cm
Ao pk vel: 1.07 m/s
Area-P 1/2: 2.72 cm2
Calc EF: 32.8 %
MV M vel: 5.24 m/s
MV Peak grad: 109.8 mmHg
MV VTI: 2.75 cm2
MV Vena cont: 0.5 cm
P 1/2 time: 662 msec
Radius: 0.6 cm
S' Lateral: 5.3 cm
Single Plane A2C EF: 28.2 %
Single Plane A4C EF: 36.8 %

## 2023-02-07 LAB — MAGNESIUM: Magnesium: 2.3 mg/dL (ref 1.7–2.4)

## 2023-02-07 LAB — TSH: TSH: 3.799 u[IU]/mL (ref 0.350–4.500)

## 2023-02-07 MED ORDER — DAPAGLIFLOZIN PROPANEDIOL 5 MG PO TABS: ORAL_TABLET | ORAL | 3 refills | Status: DC

## 2023-02-07 MED ORDER — CARVEDILOL 3.125 MG PO TABS: 3.1250 mg | ORAL_TABLET | Freq: Two times a day (BID) | ORAL | 3 refills | Status: DC

## 2023-02-07 NOTE — Patient Instructions (Signed)
The has been no changes to your medications.  Labs done today, your results will be available in MyChart, we will contact you for abnormal readings.  Genetic test has been done, this has to be sent to [place] to be processed and can take 1-2 weeks to get results back.  We will let you know the results.  Your provider has recommended that  you wear a Zio Patch for 7 days.  This monitor will record your heart rhythm for our review.  IF you have any symptoms while wearing the monitor please press the button.  If you have any issues with the patch or you notice a red or orange light on it please call the company at [phone].  Once you remove the patch please mail it back to the company as soon as possible so we can get the results.   How to Prepare for Your Cardiac PET/CT Stress Test:  1. Please do not take these medications before your test:   Medications that may interfere with the cardiac pharmacological stress agent (ex. nitrates - including erectile dysfunction medications, isosorbide mononitrate, tamulosin or beta-blockers) the day of the exam. (Erectile dysfunction medication should be held for at least 72 hrs prior to test) Theophylline containing medications for 12 hours. Dipyridamole 48 hours prior to the test. Your remaining medications may be taken with water.  2. Nothing to eat or drink, except water, 3 hours prior to arrival time.   NO caffeine/decaffeinated products, or chocolate 12 hours prior to arrival.  3. NO perfume, cologne or lotion  4. Total time is 1 to 2 hours; you may want to bring reading material for the waiting time.  5. Please report to Radiology at the [facility] Main Entrance 30 minutes early for your test.  [address] [place], [place] [address]  Diabetic Preparation:  Hold oral medications. You may take NPH and Lantus insulin. Do not take Humalog or Humulin R (Regular Insulin) the day of your test. Check blood sugars prior to  leaving the house. If able to eat breakfast prior to 3 hour fasting, you may take all medications, including your insulin, Do not worry if you miss your breakfast dose of insulin - start at your next meal.  IF YOU THINK YOU MAY BE PREGNANT, OR ARE NURSING PLEASE INFORM THE TECHNOLOGIST.  In preparation for your appointment, medication and supplies will be purchased.  Appointment availability is limited, so if you need to cancel or reschedule, please call the Radiology Department at [phone]  24 hours in advance to avoid a cancellation fee of $100.00  What to Expect After you Arrive:  Once you arrive and check in for your appointment, you will be taken to a preparation room within the Radiology Department.  A technologist or Nurse will obtain your medical history, verify that you are correctly prepped for the exam, and explain the procedure.  Afterwards,  an IV will be started in your arm and electrodes will be placed on your skin for EKG monitoring during the stress portion of the exam. Then you will be escorted to the PET/CT scanner.  There, staff will get you positioned on the scanner and obtain a blood pressure and EKG.  During the exam, you will continue to be connected to the EKG and blood pressure machines.  A small, safe amount of a radioactive tracer will be injected in your IV to obtain a series of pictures of your heart along with an injection of a  stress agent.    After your Exam:  It is recommended that you eat a meal and drink a caffeinated beverage to counter act any effects of the stress agent.  Drink plenty of fluids for the remainder of the day and urinate frequently for the first couple of hours after the exam.  Your doctor will inform you of your test results within 7-10 business days.  For questions about your test or how to prepare for your test, please call: Rockwell Alexandria, Cardiac Imaging Nurse Navigator  Larey Brick, Cardiac Imaging Nurse Navigator Office:  [phone]  Your physician recommends that you schedule a follow-up appointment in: 2 months  If you have any questions or concerns before your next appointment please send [place] a message through [facility] or call our office at [phone].    TO LEAVE A MESSAGE FOR THE NURSE SELECT OPTION 2, PLEASE LEAVE A MESSAGE INCLUDING: YOUR NAME DATE OF BIRTH CALL BACK NUMBER REASON FOR CALL**this is important as we prioritize the call backs  YOU WILL RECEIVE A CALL BACK THE SAME DAY AS LONG AS YOU CALL BEFORE 4:00 PM  At the [facility], you and your health needs are our priority. As part of our continuing mission to provide you with exceptional heart care, we have created designated Provider Care Teams. These Care Teams include your primary Cardiologist (physician) and Advanced Practice Providers (APPs- Physician Assistants and Nurse Practitioners) who all work together to provide you with the care you need, when you need it.   You may see any of the following providers on your designated Care Team at your next follow up: Dr Arvilla Meres Dr Marca Ancona Dr. Marcos Eke, NP Robbie Lis, [place] [facility] [place], [place] Brynda Peon, NP Karle Plumber, PharmD   Please be sure to bring in all your medications bottles to every appointment.    Thank you for choosing [facility]

## 2023-02-08 ENCOUNTER — Encounter (HOSPITAL_COMMUNITY): Payer: Self-pay | Admitting: Internal Medicine

## 2023-02-08 ENCOUNTER — Other Ambulatory Visit (HOSPITAL_COMMUNITY): Payer: Self-pay

## 2023-02-08 MED ORDER — VALSARTAN 40 MG PO TABS: 20.0000 mg | ORAL_TABLET | Freq: Every day | ORAL | 1 refills | Status: DC

## 2023-02-09 ENCOUNTER — Other Ambulatory Visit (HOSPITAL_COMMUNITY): Payer: Self-pay

## 2023-02-09 MED ORDER — VALSARTAN 40 MG PO TABS: 20.0000 mg | ORAL_TABLET | Freq: Two times a day (BID) | ORAL | 1 refills | Status: DC

## 2023-02-09 MED ORDER — TORSEMIDE 20 MG PO TABS: 20.0000 mg | ORAL_TABLET | Freq: Two times a day (BID) | ORAL | 3 refills | Status: DC

## 2023-02-09 NOTE — Progress Notes (Signed)
error 

## 2023-02-20 DIAGNOSIS — I5022 Chronic systolic (congestive) heart failure: Secondary | ICD-10-CM | POA: Diagnosis not present

## 2023-03-30 ENCOUNTER — Encounter: Payer: Self-pay | Admitting: Internal Medicine

## 2023-04-07 NOTE — Progress Notes (Signed)
ADVANCED HF CLINIC NOTE  Referring Physician: Dr. Bjorn Pippin Primary Care: Mitzi Hansen, NP Primary Cardiologist: Little Ishikawa, MD  HPI: Janet Ramirez is a 62 yo woman with hypothyroidism, HL, PVCs and systolic HF   No known h/o of heart problems until 12/21 -> admitted with CP and mildly elevated hstrop. Echo 12/21 EF 25-30%, severe LV dilatation, normal RV function, mild to moderate MR, mild AI.  Cath showed normal coronary arteries with well-compensated hemodynamics, 3-day Zio showed one 8 beat run of NSVT, and 21 runs of SVT with longest lasting 18 beats, frequent PVCs (6.1%).  cMRI 11/13/20: 1. LVEF 35% 2.  Normal RV size and systolic function (EF 58%) 3. Basal septal midwall LGE and RV insertion LGE, which is a scar pattern seen in nonischemic cardiomyopathies and associated with worse prognosis 4.  Mild aortic regurgitation, regurgitant fraction 9%  28 day event monitor 1/22:  PVCs 2%  She was seen for first time 11/26/20  EKG with high PVC burden. 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 Developed CP so stopped. Later decided on short trial of amio but patient refused due to potential SEs.   She saw Dr. Johney Frame in 3/22 who felt PVCs the result of her CM and not cause and did not think she required AAD or ablation (PVCs felt to be in LV)  Echo 01/07/21: EF 30-35% RV normal mild AI   Echo 05/31/21: EF 35-40%  Echo 04/23: EF ~ 45% RV okay Echo 5/24 EF 25-30% ECG: Frequent PVCs -> Zio, PET and genetic testing ordered  Zio 4/24: 15.2% PVCs - mostly one morphology. 25 runs SVT   Past Medical History:  Diagnosis Date   CHF (congestive heart failure) (HCC)    Hyperlipidemia    Hypothyroidism     Current Outpatient Medications  Medication Sig Dispense Refill   alum & mag hydroxide-simeth (MAALOX/MYLANTA) 200-200-20 MG/5ML suspension Take 30 mLs by mouth every 6 (six) hours as needed for indigestion or heartburn.     calcium carbonate (TUMS - DOSED IN MG  ELEMENTAL CALCIUM) 500 MG chewable tablet Chew 1-2 tablets by mouth as needed for indigestion or heartburn.     carvedilol (COREG) 3.125 MG tablet Take 1 tablet (3.125 mg total) by mouth 2 (two) times daily with a meal. 60 tablet 3   cholecalciferol (VITAMIN D3) 25 MCG (1000 UNIT) tablet Take 1,000 Units by mouth daily.     ENTRESTO 24-26 MG TAKE 1 TABLET BY MOUTH TWICE DAILY 60 tablet 10   FARXIGA 5 MG TABS tablet TAKE 1 TABLET(5 MG) BY MOUTH DAILY 30 tablet 11   Multiple Vitamins-Minerals (MULTIVITAMIN WITH MINERALS) tablet Take 1 tablet by mouth daily.     simethicone (MYLICON) 125 MG chewable tablet Chew 125 mg by mouth every 6 (six) hours as needed for flatulence.     vitamin C (ASCORBIC ACID) 500 MG tablet Take 500 mg by mouth daily.     No current facility-administered medications for this encounter.    Allergies  Allergen Reactions   Codeine Itching   Sulfacetamide Sodium Diarrhea      Social History   Socioeconomic History   Marital status: Married    Spouse name: Not on file   Number of children: Not on file   Years of education: Not on file   Highest education level: Not on file  Occupational History   Not on file  Tobacco Use   Smoking status: Never   Smokeless tobacco: Never  Substance and Sexual Activity   Alcohol use: Yes    Comment: socially   Drug use: Never   Sexual activity: Yes    Partners: Male  Other Topics Concern   Not on file  Social History Narrative   Not on file   Social Determinants of Health   Financial Resource Strain: Not on file  Food Insecurity: Not on file  Transportation Needs: Not on file  Physical Activity: Not on file  Stress: Not on file  Social Connections: Not on file  Intimate Partner Violence: Not on file      Family History  Problem Relation Age of Onset   Hypertension Mother    Heart disease Father        s/p stent and pacemaker placed in his 27s   Diabetes Mellitus II Father     Vitals:   01/25/22 1423   BP: 138/88  Pulse: 65  SpO2: 94%  Weight: 55.3 kg (122 lb)    ECG: Sinus rhythm with very frequent PVCs. Personally reviewed   PHYSICAL EXAM: General:  Well appearing. No resp difficulty HEENT: normal Neck: supple. no JVD. Carotids 2+ bilat; no bruits. No lymphadenopathy or thryomegaly appreciated. Cor: PMI nondisplaced. Irregular rate & rhythm. No rubs, gallops or murmurs. Lungs: clear Abdomen: soft, nontender, nondistended. No hepatosplenomegaly. No bruits or masses. Good bowel sounds. Extremities: no cyanosis, clubbing, rash, edema Neuro: alert & orientedx3, cranial nerves grossly intact. moves all 4 extremities w/o difficulty. Affect pleasant  ASSESSMENT & PLAN:  1. 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 - Echo 12/21 EF 25-30%, severe LV dilatation, normal RV, mild-mod MR, mild AI.   - Cath 12/21 normal coronary arteries - Zio 12/21 8 beat run NSVT, 21 runs of SVT, frequent PVCs (6.1%)with 3 morphologies.  - 28 day event monitor 1/22:  PVCs 2% - cMRI 2/22: LVEF 35% RVEF 58%. Basal septal midwall LGE and RV insertion LGE - Echo 01/07/21: EF 30-35% RV normal mild AI   - Echo 05/31/21: EF 35-40%  - Echo 04/23: EF 45%  - Echo 02/07/23 worsening EF 25-30% in setting of frequent PVCs - Etiology unclear. 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 Has seen Dr. Johney Frame who felt PVC s result of CM and not cause - NYHA II. Volume looks okay on exam.  - Continue carvedilol 3.125 bid (Previously decreased due to low BP) - Failed Entrresto due to low BP - Continue losartan 25 qhs  - Off spiro due to hypotension. Does not want to retrial, made her feel poorly.  - Continue Farxiga 5mg  daily (did not tolerate 10mg  due to migraines) - Zio 5/24 15.2% PVCs -> Plan trial of amio 200 daily for suppression (failed mexilitene) - Pending PET scan to r/o sarcoid + genetic testing to evaluate for LMNA  2. Frequent PVCs:  - Zio patch x 3 days on  09/29/2020 showed an 8 beat run of NSVT, and 21 runs of SVT with longest lasting 18 beats, frequent PVCs (6.1% with 3 morphologies. Highest daily burden 8.7%) - 28 day event monitor 1/22:  PVCs 2% - Intolerant mexilitene after a couple of doses due to CP. Refused amio - Saw Dr. Johney Frame in 3/22 who felt PVCs the result of her CM and not cause and did not think she required AAD or ablation (PVCs felt to be in LV) - Zio 5/24 15.2% PVCs - Plan trial of amio 200 daily for suppression (  failed mexilitene)  3. Hypothyroidism - TSH normal 06/22   Arvilla Meres, MD  11:55 PM

## 2023-04-10 ENCOUNTER — Ambulatory Visit (HOSPITAL_COMMUNITY)
Admission: RE | Admit: 2023-04-10 | Discharge: 2023-04-10 | Disposition: A | Discharge status: Home / Self Care | Payer: BC Managed Care – PPO | Source: Ambulatory Visit | Attending: Internal Medicine | Admitting: Internal Medicine

## 2023-04-10 ENCOUNTER — Encounter (HOSPITAL_COMMUNITY): Payer: Self-pay | Admitting: Internal Medicine

## 2023-04-10 VITALS — BP 130/78 | HR 71 | Wt 124.2 lb

## 2023-04-10 DIAGNOSIS — Z8249 Family history of ischemic heart disease and other diseases of the circulatory system: Secondary | ICD-10-CM | POA: Diagnosis not present

## 2023-04-10 DIAGNOSIS — I5042 Chronic combined systolic (congestive) and diastolic (congestive) heart failure: Secondary | ICD-10-CM | POA: Diagnosis not present

## 2023-04-10 DIAGNOSIS — I493 Ventricular premature depolarization: Secondary | ICD-10-CM

## 2023-04-10 DIAGNOSIS — Z79899 Other long term (current) drug therapy: Secondary | ICD-10-CM | POA: Insufficient documentation

## 2023-04-10 DIAGNOSIS — I5022 Chronic systolic (congestive) heart failure: Secondary | ICD-10-CM

## 2023-04-10 DIAGNOSIS — I428 Other cardiomyopathies: Secondary | ICD-10-CM | POA: Insufficient documentation

## 2023-04-10 MED ORDER — LOSARTAN POTASSIUM 25 MG PO TABS: 25.0000 mg | ORAL_TABLET | Freq: Every day | ORAL | 3 refills | Status: DC

## 2023-04-10 MED ORDER — MEXILETINE HCL 150 MG PO CAPS: 150.0000 mg | ORAL_CAPSULE | Freq: Two times a day (BID) | ORAL | 3 refills | Status: DC

## 2023-04-10 NOTE — Patient Instructions (Signed)
Medication Changes:  START: MEXILETINE 150mg  TWICE DAILY    Testing/Procedures:  Cardiac Sarcoidosis/Inflammation PET Scan  Food Diary Name: _____________________________ Please fill in EXACTLY what you have eaten and when for 24 hours PRIOR to your test date.  Time Food/Drink Comments  Breakfast                Lunch                Dinner                Snacks                 DO NOT EXERCISE THE DAY BEFORE YOUR TEST DO NOT EAT AFTER 5 PM THE DAY BEFORE YOUR TEST.  ON THE DAY OF YOUR TEST, DO NOT EAT ANY FOOD AND ONLY DRINK CLEAR WATER! PLEASE BRING THIS FOOD DIARY WITH YOU TO YOUR APPOINTMENT    Cardiac Sarcoidosis/Inflammation PET Scan Patient Instructions  Please report to Radiology at the [facility] Main Entrance 15 minutes early for your test.  [address] [place], [place] [address] BRING FOOD DIARY WITH YOU TO THIS APPOINTMENT For 24 hours before the test: Do not exercise! Do not eat after 5 pm the day before your test! To make sure that your scanning results are accurate, you MUST follow the sarcoid prep meal diet starting the day before your PET scan. This diet involves eating no carbohydrates 24 hours before the test.  You will keep a log of all that you eat the day before your test. If you have questions or do not understand this diet, please call [phone] for more information. If you are unable to follow this diet, please discuss an alternative strategy with the coordinator.  If you are diabetic, continue your diabetes medications as usual on the day before until you begin to fast. NO DIABETES MEDICATIONS ONCE YOU BEGIN TO FAST. What foods can I eat the day before my test?  Drink only water or black coffee (WITHOUT sugar, artificial sweetener, cream, or milk). Eggs (prepared without milk or cheese)  Meat that is either broiled or pan fried in butter WITHOUT breading (chicken, [place], bacon, meat-only sausage, hamburger, steak,  fish) Butter, salt & pepper What foods must I AVOID the day before my test?  Do not consume alcoholic beverages, sodas, fruit juice, coffee creamer, or sports drinks  Do not eat vegetables, beans, nuts, fruits, juices, bread, grains, rice, pasta, potatoes, or any baked goods Do not eat dairy products (milk, cheese, etc.)  Do not eat mayonnaise, ketchup, tartar sauce, mustard, or other condiments Do not add sugar, artificial sweeteners, or Splenda (sucralose) to foods or drinks  Do not eat breaded foods (like fried chicken)  Do not eat sweets, candy, gum, sweetened cough drops, lozenges, or sugar  Do not eat sweetened, grilled, or cured meats or meat with carbohydrate-containing additives (some sausages, ham, sweetened bacon) Suggested items for breakfast, lunch, or dinner:  Breakfast  3 to 5 fatty sausage links fried in butter. 3 to 5 bacon strips.  3 eggs pan fried in butter (no milk or cheese).  Lunch/Dinner  2 hamburger patties fried in butter. Chicken or fatty fish pan fried in butter. No breading. 8 oz. fatty steak pan fried in butter.  Beverages  Drink only water or black coffee. DO NOT ADD SUGAR, ARTIFICIAL SWEETENER, CREAM, OR MILK   For more information and frequently asked questions, please visit our website : [email or web address]  Follow-Up in: PLEASE CALL OUR OFFICE AROUND SEPTEMBER 2024 TO GET SCHEDULED FOR YOUR 3 MONTH APPOINTMENT. PHONE NUMBER IS [phone] OPTION 2   At the [facility], you and your health needs are our priority. We have a designated team specialized in the treatment of Heart Failure. This Care Team includes your primary Heart Failure Specialized Cardiologist (physician), Advanced Practice Providers (APPs- Physician Assistants and Nurse Practitioners), and Pharmacist who all work together to provide you with the care you need, when you need it.   You may see any of the following providers on your designated Care Team at  your next follow up:  Dr. Arvilla Meres Dr. Marca Ancona Dr. Marcos Eke, NP Robbie Lis, [place] [facility] [place], [place] Brynda Peon, NP Karle Plumber, PharmD   Please be sure to bring in all your medications bottles to every appointment.   Need to Contact us:  If you have any questions or concerns before your next appointment please send [place] a message through [place] or call our office at [phone].    TO LEAVE A MESSAGE FOR THE NURSE SELECT OPTION 2, PLEASE LEAVE A MESSAGE INCLUDING: YOUR NAME DATE OF BIRTH CALL BACK NUMBER REASON FOR CALL**this is important as we prioritize the call backs  YOU WILL RECEIVE A CALL BACK THE SAME DAY AS LONG AS YOU CALL BEFORE 4:00 PM

## 2023-04-12 ENCOUNTER — Telehealth (HOSPITAL_COMMUNITY): Payer: Self-pay | Admitting: *Deleted

## 2023-04-12 NOTE — Telephone Encounter (Signed)
Cardiac pet Berkley Harvey   Order ID: [id number]       Authorized  Approval Valid Through: 04/12/2023 - 05/11/2023

## 2023-04-26 ENCOUNTER — Encounter (HOSPITAL_COMMUNITY): Payer: Self-pay

## 2023-04-26 DIAGNOSIS — Z1231 Encounter for screening mammogram for malignant neoplasm of breast: Secondary | ICD-10-CM | POA: Diagnosis not present

## 2023-05-08 ENCOUNTER — Telehealth (HOSPITAL_COMMUNITY): Payer: Self-pay | Admitting: *Deleted

## 2023-05-08 NOTE — Telephone Encounter (Signed)
Reaching out to patient to offer assistance regarding upcoming cardiac imaging study; pt verbalizes understanding of appt date/time, parking situation and where to check in, pre-test NPO status and verified current allergies; name and call back number provided for further questions should they arise  Larey Brick RN Navigator Cardiac Imaging [facility] [phone] office [phone] cell  Patient verbalizes understanding of diet prep for PET Sarcoid study.

## 2023-05-10 ENCOUNTER — Encounter (HOSPITAL_COMMUNITY)
Admission: RE | Admit: 2023-05-10 | Discharge: 2023-05-10 | Disposition: A | Discharge status: Home / Self Care | Payer: BC Managed Care – PPO | Source: Ambulatory Visit | Attending: Internal Medicine | Admitting: Internal Medicine

## 2023-05-10 DIAGNOSIS — R918 Other nonspecific abnormal finding of lung field: Secondary | ICD-10-CM | POA: Diagnosis not present

## 2023-05-10 DIAGNOSIS — I517 Cardiomegaly: Secondary | ICD-10-CM | POA: Diagnosis not present

## 2023-05-10 DIAGNOSIS — I5022 Chronic systolic (congestive) heart failure: Secondary | ICD-10-CM

## 2023-05-10 LAB — NM PET CT MYOCARDIAL SARCOIDOSIS
Nuc Stress EF: 20 %
Rest Nuclear Isotope Dose: 14.8 mCi

## 2023-05-10 MED ORDER — RUBIDIUM RB82 GENERATOR (RUBYFILL)
14.7800 | PACK | Freq: Once | INTRAVENOUS | Status: AC
  Administered 2023-05-10: 14.78 via INTRAVENOUS

## 2023-05-10 MED ORDER — FLUDEOXYGLUCOSE F - 18 (FDG) INJECTION
9.6000 | Freq: Once | INTRAVENOUS | Status: AC | PRN
  Administered 2023-05-10: 9.6 via INTRAVENOUS

## 2023-05-22 NOTE — Telephone Encounter (Signed)
Dr Gala Romney Please see message regarding Mexiltine and LVEF recovery

## 2023-05-29 DIAGNOSIS — E782 Mixed hyperlipidemia: Secondary | ICD-10-CM | POA: Diagnosis not present

## 2023-05-29 DIAGNOSIS — Z0000 Encounter for general adult medical examination without abnormal findings: Secondary | ICD-10-CM | POA: Diagnosis not present

## 2023-06-06 ENCOUNTER — Telehealth (HOSPITAL_COMMUNITY): Payer: Self-pay | Admitting: *Deleted

## 2023-06-06 NOTE — Telephone Encounter (Signed)
Called patient for reminder of CPX appointment on Monday 06/12/23 at 11am . Patient confirmed appointment and all applicable questions were answered. Provided patient with call back [phone] if cancellation/reschedule is necessary. Any clinical concerns beyond CPX procedure were advised to be discussed with ordering provider.    Reggy Eye, MS, ACSM, [facility] Clinical Exercise Physiologist/ Health and Wellness Coach

## 2023-06-12 ENCOUNTER — Other Ambulatory Visit (HOSPITAL_COMMUNITY): Payer: Self-pay | Admitting: *Deleted

## 2023-06-12 ENCOUNTER — Ambulatory Visit (HOSPITAL_COMMUNITY): Payer: BC Managed Care – PPO | Attending: Internal Medicine

## 2023-06-12 DIAGNOSIS — I509 Heart failure, unspecified: Secondary | ICD-10-CM | POA: Insufficient documentation

## 2023-06-15 ENCOUNTER — Telehealth (HOSPITAL_COMMUNITY): Payer: Self-pay | Admitting: Cardiology

## 2023-06-15 NOTE — Telephone Encounter (Signed)
Patient called to report she has started mexilitine, was reported at last ov 7/8 she was not taking  Message as Lorain Childes

## 2023-07-07 ENCOUNTER — Other Ambulatory Visit (HOSPITAL_COMMUNITY): Payer: Self-pay | Admitting: Cardiology

## 2023-07-07 ENCOUNTER — Ambulatory Visit (HOSPITAL_COMMUNITY)
Admission: RE | Admit: 2023-07-07 | Discharge: 2023-07-07 | Disposition: A | Discharge status: Home / Self Care | Payer: BC Managed Care – PPO | Source: Ambulatory Visit | Attending: Internal Medicine | Admitting: Internal Medicine

## 2023-07-07 ENCOUNTER — Other Ambulatory Visit (HOSPITAL_COMMUNITY): Payer: Self-pay | Admitting: Internal Medicine

## 2023-07-07 ENCOUNTER — Inpatient Hospital Stay (HOSPITAL_COMMUNITY)
Admission: RE | Admit: 2023-07-07 | Discharge: 2023-07-07 | Disposition: A | Discharge status: Home / Self Care | Payer: BC Managed Care – PPO | Source: Ambulatory Visit | Attending: Internal Medicine | Admitting: Internal Medicine

## 2023-07-07 ENCOUNTER — Encounter (HOSPITAL_COMMUNITY): Payer: Self-pay | Admitting: Internal Medicine

## 2023-07-07 VITALS — BP 122/80 | HR 65 | Wt 123.6 lb

## 2023-07-07 DIAGNOSIS — Z8249 Family history of ischemic heart disease and other diseases of the circulatory system: Secondary | ICD-10-CM | POA: Insufficient documentation

## 2023-07-07 DIAGNOSIS — E039 Hypothyroidism, unspecified: Secondary | ICD-10-CM | POA: Insufficient documentation

## 2023-07-07 DIAGNOSIS — I5042 Chronic combined systolic (congestive) and diastolic (congestive) heart failure: Secondary | ICD-10-CM | POA: Diagnosis not present

## 2023-07-07 DIAGNOSIS — I428 Other cardiomyopathies: Secondary | ICD-10-CM | POA: Diagnosis not present

## 2023-07-07 DIAGNOSIS — Z79899 Other long term (current) drug therapy: Secondary | ICD-10-CM | POA: Insufficient documentation

## 2023-07-07 DIAGNOSIS — I5022 Chronic systolic (congestive) heart failure: Secondary | ICD-10-CM

## 2023-07-07 DIAGNOSIS — I493 Ventricular premature depolarization: Secondary | ICD-10-CM

## 2023-07-07 MED ORDER — MEXILETINE HCL 150 MG PO CAPS: 150.0000 mg | ORAL_CAPSULE | Freq: Two times a day (BID) | ORAL | 3 refills | Status: DC

## 2023-07-07 NOTE — Patient Instructions (Signed)
Medication Changes:  None, continue current medications  Lab Work:  none  Testing/Procedures:  Your provider has recommended that  you wear a Zio Patch for 7 days.  This monitor will record your heart rhythm for our review.  IF you have any symptoms while wearing the monitor please press the button.  If you have any issues with the patch or you notice a red or orange light on it please call the company at [phone].  Once you remove the patch please mail it back to the company as soon as possible so we can get the results.  Referrals:  none  Special Instructions // Education:  Do the following things EVERYDAY: Weigh yourself in the morning before breakfast. Write it down and keep it in a log. Take your medicines as prescribed Eat low salt foods--Limit salt (sodium) to 2000 mg per day.  Stay as active as you can everyday Limit all fluids for the day to less than 2 liters   Follow-Up in: 3 months (January 2025), **PLEASE CALL OUR OFFICE IN NOVEMBER TO SCHEDULE THIS APPOINTMENT   At the [facility], you and your health needs are our priority. We have a designated team specialized in the treatment of Heart Failure. This Care Team includes your primary Heart Failure Specialized Cardiologist (physician), Advanced Practice Providers (APPs- Physician Assistants and Nurse Practitioners), and Pharmacist who all work together to provide you with the care you need, when you need it.   You may see any of the following providers on your designated Care Team at your next follow up:  Dr. Arvilla Meres Dr. Marca Ancona Dr. Dorthula Nettles Dr. Theresia Bough Tonye Becket, NP Robbie Lis, [place] [facility] [place], [place] Brynda Peon, NP [place] Lee, NP Karle Plumber, PharmD   Please be sure to bring in all your medications bottles to every appointment.   Need to Contact us:  If you have any questions or concerns before your next appointment please send [place] a  message through [place] or call our office at [phone].    TO LEAVE A MESSAGE FOR THE NURSE SELECT OPTION 2, PLEASE LEAVE A MESSAGE INCLUDING: YOUR NAME DATE OF BIRTH CALL BACK NUMBER REASON FOR CALL**this is important as we prioritize the call backs  YOU WILL RECEIVE A CALL BACK THE SAME DAY AS LONG AS YOU CALL BEFORE 4:00 PM

## 2023-07-07 NOTE — Progress Notes (Signed)
ADVANCED HF CLINIC NOTE  Referring Physician: Dr. Bjorn Pippin Primary Care: Mitzi Hansen, NP Primary Cardiologist: Little Ishikawa, MD  HPI: Janet Ramirez is a 62 yo woman with hypothyroidism, HL, PVCs and systolic HF   No known h/o of heart problems until 12/21 -> admitted with CP and mildly elevated hstrop. Echo 12/21 EF 25-30%, severe LV dilatation, normal RV function, mild to moderate MR, mild AI.  Cath showed normal coronary arteries with well-compensated hemodynamics, 3-day Zio showed one 8 beat run of NSVT, and 21 runs of SVT with longest lasting 18 beats, frequent PVCs (6.1%).  cMRI 11/13/20: 1. LVEF 35% 2.  Normal RV size and systolic function (EF 58%) 3. Basal septal midwall LGE and RV insertion LGE, which is a scar pattern seen in nonischemic cardiomyopathies and associated with worse prognosis 4.  Mild aortic regurgitation, regurgitant fraction 9%  28 day event monitor 1/22:  PVCs 2%  She was seen for first time 11/26/20  EKG with high PVC burden. 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 Developed CP so stopped. Later decided on short trial of amio but patient refused due to potential SEs.   She saw Dr. Johney Frame in 3/22 who felt PVCs the result of her CM and not cause and did not think she required AAD or ablation (PVCs felt to be in LV)  Echo 01/07/21: EF 30-35% RV normal mild AI   Echo 05/31/21: EF 35-40%  Echo 04/23: EF ~ 45% RV okay Echo 5/24 EF 25-30%  ECG: Frequent PVCs -> Zio, PET and genetic testing ordered  Zio 4/24: 15.2% PVCs - mostly one morphology. 25 runs SVT Genetic testing: negative  -> mexilitene started but she didn't start until 9/10   Here for f/u with her husband. Feels good. Active working in the yard. Building a fence. No CP or SOB. Feels better with mexilitene   Past Medical History:  Diagnosis Date   CHF (congestive heart failure) (HCC)    Hyperlipidemia    Hypothyroidism     Current Outpatient Medications  Medication Sig  Dispense Refill   alum & mag hydroxide-simeth (MAALOX/MYLANTA) 200-200-20 MG/5ML suspension Take 30 mLs by mouth every 6 (six) hours as needed for indigestion or heartburn.     calcium carbonate (TUMS - DOSED IN MG ELEMENTAL CALCIUM) 500 MG chewable tablet Chew 1-2 tablets by mouth as needed for indigestion or heartburn.     carvedilol (COREG) 3.125 MG tablet Take 1 tablet (3.125 mg total) by mouth 2 (two) times daily with a meal. 180 tablet 3   cholecalciferol (VITAMIN D3) 25 MCG (1000 UNIT) tablet Take 2,000 Units by mouth daily.     dapagliflozin propanediol (FARXIGA) 5 MG TABS tablet TAKE 1 TABLET(5 MG) BY MOUTH DAILY 90 tablet 3   losartan (COZAAR) 25 MG tablet Take 1 tablet (25 mg total) by mouth daily. 90 tablet 3   mexiletine (MEXITIL) 150 MG capsule Take 1 capsule (150 mg total) by mouth 2 (two) times daily. 60 capsule 3   Multiple Vitamins-Minerals (MULTIVITAMIN WITH MINERALS) tablet Take 1 tablet by mouth daily.     simethicone (MYLICON) 125 MG chewable tablet Chew 125 mg by mouth every 6 (six) hours as needed for flatulence.     vitamin C (ASCORBIC ACID) 500 MG tablet Take 500 mg by mouth daily. As needed     No current facility-administered medications for this encounter.    Allergies  Allergen Reactions   Codeine Itching   Sulfacetamide Sodium Diarrhea  Social History   Socioeconomic History   Marital status: Married    Spouse name: Not on file   Number of children: Not on file   Years of education: Not on file   Highest education level: Not on file  Occupational History   Not on file  Tobacco Use   Smoking status: Never   Smokeless tobacco: Never  Substance and Sexual Activity   Alcohol use: Yes    Comment: socially   Drug use: Never   Sexual activity: Yes    Partners: Male  Other Topics Concern   Not on file  Social History Narrative   Not on file   Social Determinants of Health   Financial Resource Strain: Not on file  Food Insecurity: Not on file   Transportation Needs: Not on file  Physical Activity: Not on file  Stress: Not on file  Social Connections: Not on file  Intimate Partner Violence: Not on file      Family History  Problem Relation Age of Onset   Hypertension Mother    Heart disease Father        s/p stent and pacemaker placed in his 30s   Diabetes Mellitus II Father     Vitals:   07/07/23 1038  BP: 122/80  Pulse: 65  SpO2: 99%  Weight: 56.1 kg (123 lb 9.6 oz)    PHYSICAL EXAM: General:  Well appearing. No resp difficulty HEENT: normal Neck: supple. no JVD. Carotids 2+ bilat; no bruits. No lymphadenopathy or thryomegaly appreciated. Cor: PMI nondisplaced. Regular rate & rhythm. No rubs, gallops or murmurs. Lungs: clear Abdomen: soft, nontender, nondistended. No hepatosplenomegaly. No bruits or masses. Good bowel sounds. Extremities: no cyanosis, clubbing, rash, edema Neuro: alert & orientedx3, cranial nerves grossly intact. moves all 4 extremities w/o difficulty. Affect pleasant    ASSESSMENT & PLAN:  1. 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 - Echo 12/21 EF 25-30%, severe LV dilatation, normal RV, mild-mod MR, mild AI.   - Cath 12/21 normal coronary arteries - Zio 12/21 8 beat run NSVT, 21 runs of SVT, frequent PVCs (6.1%)with 3 morphologies.  - 28 day event monitor 1/22:  PVCs 2% - cMRI 2/22: LVEF 35% RVEF 58%. Basal septal midwall LGE and RV insertion LGE - Echo 01/07/21: EF 30-35% RV normal mild AI   - Echo 05/31/21: EF 35-40%  - Echo 04/23: EF 45%  - Echo 02/07/23 worsening EF 25-30% in setting of frequent PVCs - Etiology unclear. 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 Has seen Dr. Johney Frame who felt PVC s result of CM and not cause - Stable NYHA II-III. Volume ok.  - Continue carvedilol 3.125 bid (Previously decreased due to low BP) - Failed Entrresto and valsartan due to low BP - Continue losartan 25 qhs  - Off spiro due to hypotension.  Does not want to retrial, made her feel poorly.  - Continue Farxiga 5mg  daily (did not tolerate 10mg  due to migraines) - Zio 5/24 15.2% PVCs -> Reluctant to try amio. Will retry mexilitene 150 bid - Genetic testing 5/24 negative - PET scan 8/24 EF 20% no sarcoid  2. Frequent PVCs:  - Zio patch x 3 days on 09/29/2020 showed an 8 beat run of NSVT, and 21 runs of SVT with longest lasting 18 beats, frequent PVCs (6.1% with 3 morphologies. Highest daily burden 8.7%) - 28 day event monitor 1/22:  PVCs 2% - Intolerant mexilitene  after a couple of doses due to CP. Refused amio - Saw Dr. Johney Frame in 3/22 who felt PVCs the result of her CM and not cause and did not think she required AAD or ablation (PVCs felt to be in LV) - Zio 5/24 15.2% PVCs - Reluctant to try amio. Will retry mexilitene 150 bid    Arvilla Meres, MD  11:08 AM

## 2023-07-17 ENCOUNTER — Telehealth (HOSPITAL_COMMUNITY): Payer: Self-pay | Admitting: Cardiology

## 2023-07-17 NOTE — Telephone Encounter (Signed)
PT CALLED TO REPORT MILD LIGHTLESSNESS X 5 DAYS AND ELEVATED B/P  REPORTS B/P READINGS 110-120/70-80 DENIES WEIGHT CHANGES, SOB, CP   ZIO PENDING   WITH NOTIFY PROVIDER AS FYI IN THE EVENT MED CHANGES ARE NEEDED  PT FELT ELEVATED B/P READINGS ARE COMING FROM [facility] -ADVISED ABOVE READING RANGES ARE GREAT   ADVISED PT TO KEEP B/P LOG, ADDITIONAL DETAILS COULD COME ABOUT WITH ZIO MONITOR RESULTS

## 2023-07-18 NOTE — Telephone Encounter (Signed)
Pt aware.

## 2023-07-19 DIAGNOSIS — I493 Ventricular premature depolarization: Secondary | ICD-10-CM | POA: Diagnosis not present

## 2023-07-22 ENCOUNTER — Other Ambulatory Visit: Payer: Self-pay | Admitting: Cardiology

## 2023-08-30 DIAGNOSIS — H9203 Otalgia, bilateral: Secondary | ICD-10-CM | POA: Diagnosis not present

## 2023-08-30 DIAGNOSIS — H1033 Unspecified acute conjunctivitis, bilateral: Secondary | ICD-10-CM | POA: Diagnosis not present

## 2023-08-30 DIAGNOSIS — H6693 Otitis media, unspecified, bilateral: Secondary | ICD-10-CM | POA: Diagnosis not present

## 2023-09-25 ENCOUNTER — Ambulatory Visit (HOSPITAL_COMMUNITY)
Admission: RE | Admit: 2023-09-25 | Discharge: 2023-09-25 | Disposition: A | Discharge status: Home / Self Care | Payer: BC Managed Care – PPO | Source: Ambulatory Visit | Attending: Nurse Practitioner | Admitting: Nurse Practitioner

## 2023-09-25 DIAGNOSIS — E785 Hyperlipidemia, unspecified: Secondary | ICD-10-CM | POA: Insufficient documentation

## 2023-09-25 DIAGNOSIS — I5022 Chronic systolic (congestive) heart failure: Secondary | ICD-10-CM | POA: Diagnosis not present

## 2023-09-25 DIAGNOSIS — I080 Rheumatic disorders of both mitral and aortic valves: Secondary | ICD-10-CM | POA: Insufficient documentation

## 2023-09-25 DIAGNOSIS — Z006 Encounter for examination for normal comparison and control in clinical research program: Secondary | ICD-10-CM

## 2023-09-25 LAB — ECHOCARDIOGRAM COMPLETE
Calc EF: 34 %
MV M vel: 4.93 m/s
MV Peak grad: 97.2 mm[Hg]
P 1/2 time: 470 ms
Radius: 0.5 cm
S' Lateral: 5.4 cm
Single Plane A2C EF: 34.6 %
Single Plane A4C EF: 31 %

## 2023-09-25 NOTE — Research (Signed)
SITE: 050     Subject # [id number]   Subprotocol: A  Inclusion Criteria  Patients who meet all of the following criteria are eligible for enrollment as study participants:  Yes No  Age > 62 years old X   Eligible to wear Holter Study X    Exclusion Criteria  Patients who meet any of these criteria are not eligible for enrollment as study participants: Yes No  1. Receiving any mechanical (respiratory or circulatory) or renal support therapy at Screening or during Visit #1.  X  2.  Any other conditions that in the opinion of the investigators are likely to prevent compliance with the study protocol or pose a safety concern if the subject participates in the study.  X  3. Poor tolerance, namely susceptible to severe skin allergies from ECG adhesive patch application.  X   Protocol: REV H                                     Residential [address] (First 3 digits ONLY)                                             [facility] Informed Consent   Subject Name: Janet Ramirez  Subject met inclusion and exclusion criteria.  The informed consent form, study requirements and expectations were reviewed with the subject. Subject had opportunity to read consent and questions and concerns were addressed prior to the signing of the consent form.  The subject verbalized understanding of the trial requirements.  The subject agreed to participate in the [facility] EF ACT trial and signed the informed consent at 10:52 on 25-Sep-2023.  The informed consent was obtained prior to performance of any protocol-specific procedures for the subject.  A copy of the signed informed consent was given to the subject and a copy was placed in the subject's medical record.   Dyanne Iha          Current Outpatient Medications:    alum & mag hydroxide-simeth (MAALOX/MYLANTA) 200-200-20 MG/5ML suspension, Take 30 mLs by mouth every 6 (six) hours as needed for indigestion or heartburn., Disp: , Rfl:    calcium carbonate (TUMS -  DOSED IN MG ELEMENTAL CALCIUM) 500 MG chewable tablet, Chew 1-2 tablets by mouth as needed for indigestion or heartburn., Disp: , Rfl:    carvedilol (COREG) 3.125 MG tablet, Take 1 tablet (3.125 mg total) by mouth 2 (two) times daily with a meal., Disp: 180 tablet, Rfl: 3   cholecalciferol (VITAMIN D3) 25 MCG (1000 UNIT) tablet, Take 2,000 Units by mouth daily., Disp: , Rfl:    dapagliflozin propanediol (FARXIGA) 5 MG TABS tablet, TAKE 1 TABLET(5 MG) BY MOUTH DAILY, Disp: 90 tablet, Rfl: 3   losartan (COZAAR) 25 MG tablet, Take 1 tablet (25 mg total) by mouth daily., Disp: 90 tablet, Rfl: 3   mexiletine (MEXITIL) 150 MG capsule, Take 1 capsule (150 mg total) by mouth 2 (two) times daily., Disp: 180 capsule, Rfl: 3   Multiple Vitamins-Minerals (MULTIVITAMIN WITH MINERALS) tablet, Take 1 tablet by mouth daily., Disp: , Rfl:    simethicone (MYLICON) 125 MG chewable tablet, Chew 125 mg by mouth every 6 (six) hours as needed for flatulence., Disp: , Rfl:    vitamin C (ASCORBIC ACID) 500 MG  tablet, Take 500 mg by mouth daily. As needed, Disp: , Rfl:

## 2023-10-13 ENCOUNTER — Ambulatory Visit (HOSPITAL_COMMUNITY)
Admission: RE | Admit: 2023-10-13 | Discharge: 2023-10-13 | Disposition: A | Discharge status: Home / Self Care | Payer: BC Managed Care – PPO | Source: Ambulatory Visit | Attending: Internal Medicine | Admitting: Internal Medicine

## 2023-10-13 ENCOUNTER — Inpatient Hospital Stay (HOSPITAL_COMMUNITY)
Admission: RE | Admit: 2023-10-13 | Discharge: 2023-10-13 | Disposition: A | Discharge status: Home / Self Care | Payer: BC Managed Care – PPO | Source: Ambulatory Visit | Attending: Internal Medicine | Admitting: Internal Medicine

## 2023-10-13 ENCOUNTER — Other Ambulatory Visit (HOSPITAL_COMMUNITY): Payer: Self-pay | Admitting: Internal Medicine

## 2023-10-13 ENCOUNTER — Encounter (HOSPITAL_COMMUNITY): Payer: Self-pay | Admitting: Internal Medicine

## 2023-10-13 VITALS — BP 112/80 | HR 57 | Wt 126.6 lb

## 2023-10-13 DIAGNOSIS — I493 Ventricular premature depolarization: Secondary | ICD-10-CM | POA: Diagnosis not present

## 2023-10-13 DIAGNOSIS — I5022 Chronic systolic (congestive) heart failure: Secondary | ICD-10-CM

## 2023-10-13 DIAGNOSIS — I428 Other cardiomyopathies: Secondary | ICD-10-CM | POA: Insufficient documentation

## 2023-10-13 DIAGNOSIS — I5042 Chronic combined systolic (congestive) and diastolic (congestive) heart failure: Secondary | ICD-10-CM | POA: Diagnosis not present

## 2023-10-13 DIAGNOSIS — Z79899 Other long term (current) drug therapy: Secondary | ICD-10-CM | POA: Diagnosis not present

## 2023-10-13 DIAGNOSIS — E785 Hyperlipidemia, unspecified: Secondary | ICD-10-CM | POA: Insufficient documentation

## 2023-10-13 DIAGNOSIS — E039 Hypothyroidism, unspecified: Secondary | ICD-10-CM | POA: Insufficient documentation

## 2023-10-13 LAB — COMPREHENSIVE METABOLIC PANEL
ALT: 20 U/L (ref 0–44)
AST: 23 U/L (ref 15–41)
Albumin: 4 g/dL (ref 3.5–5.0)
Alkaline Phosphatase: 62 U/L (ref 38–126)
Anion gap: 7 (ref 5–15)
BUN: 7 mg/dL — ABNORMAL LOW (ref 8–23)
CO2: 29 mmol/L (ref 22–32)
Calcium: 9.8 mg/dL (ref 8.9–10.3)
Chloride: 106 mmol/L (ref 98–111)
Creatinine, Ser: 0.88 mg/dL (ref 0.44–1.00)
GFR, Estimated: >60 mL/min (ref >60)
Glucose, Bld: 101 mg/dL — ABNORMAL HIGH (ref 70–99)
Potassium: 4.1 mmol/L (ref 3.5–5.1)
Sodium: 142 mmol/L (ref 135–145)
Total Bilirubin: 0.6 mg/dL (ref 0.0–1.2)
Total Protein: 6.4 g/dL — ABNORMAL LOW (ref 6.5–8.1)

## 2023-10-13 LAB — BRAIN NATRIURETIC PEPTIDE: B Natriuretic Peptide: 218.6 pg/mL — ABNORMAL HIGH (ref 0.0–100.0)

## 2023-10-13 NOTE — Progress Notes (Signed)
 ADVANCED HF CLINIC NOTE  Referring Physician: Dr. Kate Primary Care: Toribio Arabia, NP Primary Cardiologist: Lonni LITTIE Kate, MD  HPI: Janet Ramirez is a 63 yo woman with hypothyroidism, HL, PVCs and systolic HF   No known h/o of heart problems until 12/21 -> admitted with CP and mildly elevated hstrop. Echo 12/21 EF 25-30%, severe LV dilatation, normal RV function, mild to moderate MR, mild AI.  Cath showed normal coronary arteries with well-compensated hemodynamics, 3-day Zio showed one 8 beat run of NSVT, and 21 runs of SVT with longest lasting 18 beats, frequent PVCs (6.1%).  cMRI 11/13/20: 1. LVEF 35% 2.  Normal RV size and systolic function (EF 58%) 3. Basal septal midwall LGE and RV insertion LGE, which is a scar pattern seen in nonischemic cardiomyopathies and associated with worse prognosis 4.  Mild aortic regurgitation, regurgitant fraction 9%  28 day event monitor 1/22:  PVCs 2%  She was seen for first time 11/26/20  EKG with high PVC burden. 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 Developed CP so stopped. Later decided on short trial of amio but patient refused due to potential SEs.   She saw Dr. Kelsie in 3/22 who felt PVCs the result of her CM and not cause and did not think she required AAD or ablation (PVCs felt to be in LV)  Echo 01/07/21: EF 30-35% RV normal mild AI   Echo 05/31/21: EF 35-40%  Echo 04/23: EF ~ 45% RV okay Echo 5/24 EF 25-30%  ECG: Frequent PVCs -> Zio, PET and genetic testing ordered  CPX 9/24:  FVC 2.63 (85%)      FEV1 2.14 (88%)        FEV1/FVC 81 (102%)        BP rest: 124/80 Standing BP: 124/76 BP peak: 146/78  Peak VO2: 21.7 (91% predicted peak VO2)  VE/VCO2 slope:  27  Peak RER: 1.01    Zio 4/24: 15.2% PVCs - mostly one morphology. 25 runs SVT Genetic testing: negative  -> mexilitene started but she didn't start until 06/13/23   Echo 09/25/23 EF 30-35%   Here for f/u with her husband. Feels pretty good. Has been  having episodes of low blood sugar so cut Farxiga  in 1/2. Can do most acitivities but after a while gets tired. No edema, orthopnea or PND. SBP staying > 100. Tolerating mexilitene    Past Medical History:  Diagnosis Date   CHF (congestive heart failure) (HCC)    Hyperlipidemia    Hypothyroidism     Current Outpatient Medications  Medication Sig Dispense Refill   alum & mag hydroxide-simeth (MAALOX/MYLANTA) 200-200-20 MG/5ML suspension Take 30 mLs by mouth every 6 (six) hours as needed for indigestion or heartburn.     calcium  carbonate (TUMS - DOSED IN MG ELEMENTAL CALCIUM ) 500 MG chewable tablet Chew 1-2 tablets by mouth as needed for indigestion or heartburn.     carvedilol  (COREG ) 3.125 MG tablet Take 1 tablet (3.125 mg total) by mouth 2 (two) times daily with a meal. 180 tablet 3   dapagliflozin  propanediol (FARXIGA ) 5 MG TABS tablet TAKE 1 TABLET(5 MG) BY MOUTH DAILY 90 tablet 3   Lactobacillus (PROBIOTIC ACIDOPHILUS PO) Take 1 tablet by mouth daily.     losartan  (COZAAR ) 25 MG tablet Take 1 tablet (25 mg total) by mouth daily. 90 tablet 3   mexiletine (MEXITIL) 150 MG capsule Take 1 capsule (150 mg total) by mouth 2 (two) times daily. 180 capsule 3  Multiple Vitamins-Minerals (MULTIVITAMIN WITH MINERALS) tablet Take 1 tablet by mouth daily.     omeprazole (PRILOSEC) 20 MG capsule Take 20 mg by mouth daily.     simethicone  (MYLICON) 125 MG chewable tablet Chew 125 mg by mouth every 6 (six) hours as needed for flatulence.     vitamin C (ASCORBIC ACID) 500 MG tablet Take 500 mg by mouth daily. As needed     No current facility-administered medications for this encounter.    Allergies  Allergen Reactions   Codeine Itching   Sulfacetamide Sodium Diarrhea      Social History   Socioeconomic History   Marital status: Married    Spouse name: Not on file   Number of children: Not on file   Years of education: Not on file   Highest education level: Not on file  Occupational  History   Not on file  Tobacco Use   Smoking status: Never   Smokeless tobacco: Never  Substance and Sexual Activity   Alcohol use: Yes    Comment: socially   Drug use: Never   Sexual activity: Yes    Partners: Male  Other Topics Concern   Not on file  Social History Narrative   Not on file   Social Drivers of Health   Financial Resource Strain: Not on file  Food Insecurity: Not on file  Transportation Needs: Not on file  Physical Activity: Not on file  Stress: Not on file  Social Connections: Not on file  Intimate Partner Violence: Not on file      Family History  Problem Relation Age of Onset   Hypertension Mother    Heart disease Father        s/p stent and pacemaker placed in his 52s   Diabetes Mellitus II Father     Vitals:   10/13/23 1004  BP: 112/80  Pulse: (!) 57  SpO2: 96%  Weight: 57.4 kg (126 lb 9.6 oz)     PHYSICAL EXAM: General:  Well appearing. No resp difficulty HEENT: normal Neck: supple. no JVD. Carotids 2+ bilat; no bruits. No lymphadenopathy or thryomegaly appreciated. Cor: PMI nondisplaced. Regular rate & rhythm. No rubs, gallops or murmurs. Occasional ectopy Lungs: clear Abdomen: soft, nontender, nondistended. No hepatosplenomegaly. No bruits or masses. Good bowel sounds. Extremities: no cyanosis, clubbing, rash, edema Neuro: alert & orientedx3, cranial nerves grossly intact. moves all 4 extremities w/o difficulty. Affect pleasant   ASSESSMENT & PLAN:  1. 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 - Echo 12/21 EF 25-30%, severe LV dilatation, normal RV, mild-mod MR, mild AI.   - Cath 12/21 normal coronary arteries - Zio 12/21 8 beat run NSVT, 21 runs of SVT, frequent PVCs (6.1% )with 3 morphologies.  - 28 day event monitor 1/22:  PVCs 2% - cMRI 2/22: LVEF 35% RVEF 58%. Basal septal midwall LGE and RV insertion LGE - Echo 01/07/21: EF 30-35% RV normal mild AI   - Echo 04/23: EF 45%  - Echo 02/07/23 worsening EF 25-30% in  setting of frequent PVCs - Genetic testing 5/24 negative - PET scan 8/24 EF 20% no sarcoid - CPX 9/24:  pVO2: 21.7 (91% predicted peak VO2) VE/VCO2 slope:  27  Peak RER: 1.01  - Echo 09/25/23 EF 30-35%  - Etiology unclear. 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 Has seen Dr. Kelsie who felt PVC s result of CM and  not cause - Seems a bit worse today. NYHA II-early III - Volume status ok - Continue carvedilol  3.125 bid (Previously decreased due to low BP) - Failed Entrresto and valsartan  due to low BP. Continue losartan  25 qhs  - Off spiro due to hypotension. Failed 3x  - Continue Farxiga  5mg  daily  - unable to titrate meds further due to lightheadedness  - see management of PVCs below - We again discussed ICD but she wants to defer for now - labs today  2. Frequent PVCs:  - Zio patch x 3 days on 09/29/2020 showed an 8 beat run of NSVT, and 21 runs of SVT with longest lasting 18 beats, frequent PVCs (6.1% with 3 morphologies. Highest daily burden 8.7%) - 28 day event monitor 1/22:  PVCs 2% - Saw Dr. Kelsie in 3/22 who felt PVCs the result of her CM and not cause and did not think she required AAD or ablation (PVCs felt to be in LV) - Zio 5/24 15.2% PVCs - Failed amio. Started mexilitene in 9/24 - In 10/24 Zio  Frequent PVCs (10.2%, 39167) - two morphologies (7.5% and 2.7%, respectively)  - Will repeat Zio if PVC burden < 10% and EF not improved will push harded for ICD. IF PVC burden still > 10% will try to increase mexiliten to 200 bid (she is reluctant) versus considering PVC ablation - Will refer to Dr. Cindie (EP) for his thoughts.   Toribio Fuel, MD  10:27 AM

## 2023-10-13 NOTE — Progress Notes (Signed)
 Zio patch placed onto patient.  All instructions and information reviewed with patient, they verbalize understanding with no questions.

## 2023-10-13 NOTE — Patient Instructions (Signed)
 Medication Changes:  No Changes In Medications at this time.   Lab Work:  Labs done today, your results will be available in MyChart, we will contact you for abnormal readings.  Testing/Procedures:  Your provider has recommended that  you wear a Zio Patch for 3 days.  This monitor will record your heart rhythm for our review.  IF you have any symptoms while wearing the monitor please press the button.  If you have any issues with the patch or you notice a red or orange light on it please call the company at [phone].  Once you remove the patch please mail it back to the company as soon as possible so we can get the results.  Referrals:  YOU HAVE BEEN REFERRED TO Dr. Cindie THEY WILL REACH OUT TO YOU OR CALL TO ARRANGE THIS. PLEASE CALL US  WITH ANY CONCERNS   Follow-Up in: 4 months PLEASE CALL OUR OFFICE AROUND MARCH 2025 TO GET SCHEDULED FOR YOUR APPOINTMENT. PHONE NUMBER IS [phone] OPTION 2   At the [facility], you and your health needs are our priority. We have a designated team specialized in the treatment of Heart Failure. This Care Team includes your primary Heart Failure Specialized Cardiologist (physician), Advanced Practice Providers (APPs- Physician Assistants and Nurse Practitioners), and Pharmacist who all work together to provide you with the care you need, when you need it.   You may see any of the following providers on your designated Care Team at your next follow up:  Dr. Toribio Fuel Dr. Ezra Shuck Dr. Ria Commander Dr. Odis Brownie Greig Mosses, NP Caffie Shed, [place] [facility] [place], [place] Beckey Coe, NP Jordan Lee, NP Tinnie Redman, PharmD   Please be sure to bring in all your medications bottles to every appointment.   Need to Contact Us :  If you have any questions or concerns before your next appointment please send us  a message through [place] or call our office at [phone].    TO LEAVE A MESSAGE FOR  THE NURSE SELECT OPTION 2, PLEASE LEAVE A MESSAGE INCLUDING: YOUR NAME DATE OF BIRTH CALL BACK NUMBER REASON FOR CALL**this is important as we prioritize the call backs  YOU WILL RECEIVE A CALL BACK THE SAME DAY AS LONG AS YOU CALL BEFORE 4:00 PM

## 2023-10-20 DIAGNOSIS — I493 Ventricular premature depolarization: Secondary | ICD-10-CM | POA: Diagnosis not present

## 2023-11-27 ENCOUNTER — Telehealth (HOSPITAL_COMMUNITY): Payer: Self-pay | Admitting: Cardiology

## 2023-11-27 NOTE — Telephone Encounter (Signed)
 Pt aware Reports she will try medication adjustments first

## 2023-11-27 NOTE — Telephone Encounter (Signed)
 Symptoms/sites of swelling are not typical for heart failure exacerbation. She can retrial increasing farxiga back to 10 mg daily (this would help with recovery in heart function and keep fluid off).   Otherwise can offer follow-up in clinic to definitively assess fluid status

## 2023-11-27 NOTE — Telephone Encounter (Signed)
 Patient called to report increase in hand/abdominal swelling x 2 weeks. Reports noticeable facial swelling this AM   Denies recent med changes  Mild SOB occasionally  Weight elevated @ 123.5 lb today  -normal 121lb  No diuretics daily   Please advise

## 2023-12-03 NOTE — H&P (View-Only) (Signed)
 Electrophysiology Office Note:    Date:  12/04/2023   ID:  Janet Ramirez, DOB April 09, 1961, MRN [id number]  [facility] Cardiologist:  Little Ishikawa, MD  [facility] Electrophysiologist:  Lanier Prude, MD   Referring MD: Dolores Patty, MD   Chief Complaint: Chronic systolic heart failure  History of Present Illness:    Janet Ramirez is a 63 year old woman who I am seeing today for an evaluation of chronic systolic heart failure and PVCs.  Her diagnosis of heart failure dates back to 2021 when she presented to the hospital with chest pain.  She was found to have an ejection fraction of 25%.  Cath showed normal coronary arteries.  She had a PVC burden of 6%.  Cardiac MRI in 2022 showed EF of 35% with basal septal mid wall LGE.  A ZIO monitor in 2022 showed a PVC burden of 2%.  In April 2020 4 repeat ZIO monitor showed a 15% burden of PVCs.  Mexiletine was started.  At the last appointment with Dr. Gala Romney October 13, 2023, she reported feeling well.  She reported class II heart failure symptoms.  She reports worsening fatigue over the last year.  Decreased exercise tolerance.  She is with her husband in clinic today.  No syncope or presyncope.    Their past medical, social and family history was reviewed.   ROS:   Please see the history of present illness.    All other systems reviewed and are negative.  EKGs/Labs/Other Studies Reviewed:    The following studies were reviewed today:  November 19, 2023 ZIO monitor personally reviewed PVC burden 8.9%, 3 morphologies  September 25, 2023 echo EF 30-35 RV normal Severely dilated left atrium Mildly dilated right atrium Mild to moderate MR Mild AI  July 30, 2023 ZIO monitor 10.2% PVCs, 2 morphologies  Feb 27, 2023 ZIO monitor 15.2% PVCs  November 14, 2020 cardiac MRI EF 35 Basal septal mid wall LGE  July 07, 2023 EKG shows sinus rhythm, couplet of ventricular ectopy  Feb 07, 2023 EKG shows sinus rhythm  with frequent monomorphic PVCs.  PVCs have an inferior axis with precordial transition in V3.  EKG Interpretation Date/Time:  Monday December 04 2023 11:27:08 EST Ventricular Rate:  75 PR Interval:  138 QRS Duration:  90 QT Interval:  380 QTC Calculation: 424 R Axis:   18  Text Interpretation: Sinus rhythm with occasional Premature ventricular complexes Confirmed by Steffanie Dunn [id number] on 12/04/2023 11:34:35 AM    Physical Exam:    VS:  BP 112/64   Pulse 75   Ht 5\' 4"  (1.626 m)   Wt 125 lb 9.6 oz (57 kg)   SpO2 97%   BMI 21.56 kg/m     Wt Readings from Last 3 Encounters:  12/04/23 125 lb 9.6 oz (57 kg)  10/13/23 126 lb 9.6 oz (57.4 kg)  07/07/23 123 lb 9.6 oz (56.1 kg)     GEN: no distress CARD: RRR, No MRG RESP: No IWOB. CTAB.        ASSESSMENT AND PLAN:    1. PVCs (premature ventricular contractions)   2. Chronic systolic heart failure (HCC)     #Chronic systolic heart failure #Frequent PVCs The patient has NYHA class II-III heart failure.  She follows with Dr. Gala Romney.  She has a persistently reduced left ventricular ejection fraction.  Her EF was initially found to be low several years ago.  Her PVC burden has fluctuated over the last couple of years peaking  at 15% but her EF has remained low.  It seems that her PVCs may be a secondary phenomenon and not the primary driver of her arrhythmia.  Given the variable frequency of PVCs, I do not think PVC ablation is indicated at this time.  I have recommended continued medical therapy for her systolic heart failure.  Recommend continuing mexiletine given she subjectively thinks the PVCs have improved on this drug.  I also discussed her systolic heart failure and risk for sudden cardiac death during today's clinic appointment.  I recommended ICD implant at this time.  She has a narrow complex QRS and no pacing needs.  Given her PVC burden has lessened at higher heart rates, favor having atrial pacing capacity.     Will plan for a Medtronic dual-chamber ICD.        Signed, Rossie Muskrat. Lalla Brothers, MD, [facility], [facility] 12/04/2023 11:34 AM    Electrophysiology [facility]

## 2023-12-03 NOTE — Progress Notes (Unsigned)
 Electrophysiology Office Note:    Date:  12/04/2023   ID:  Janet Ramirez, DOB 11-06-1960, MRN [id number]  [facility] Cardiologist:  Little Ishikawa, MD  [facility] Electrophysiologist:  Lanier Prude, MD   Referring MD: Dolores Patty, MD   Chief Complaint: Chronic systolic heart failure  History of Present Illness:    Ms. Janet Ramirez is a 63 year old woman who I am seeing today for an evaluation of chronic systolic heart failure and PVCs.  Her diagnosis of heart failure dates back to 2021 when she presented to the hospital with chest pain.  She was found to have an ejection fraction of 25%.  Cath showed normal coronary arteries.  She had a PVC burden of 6%.  Cardiac MRI in 2022 showed EF of 35% with basal septal mid wall LGE.  A ZIO monitor in 2022 showed a PVC burden of 2%.  In April 2020 4 repeat ZIO monitor showed a 15% burden of PVCs.  Mexiletine was started.  At the last appointment with Dr. Gala Romney October 13, 2023, she reported feeling well.  She reported class II heart failure symptoms.  She reports worsening fatigue over the last year.  Decreased exercise tolerance.  She is with her husband in clinic today.  No syncope or presyncope.    Their past medical, social and family history was reviewed.   ROS:   Please see the history of present illness.    All other systems reviewed and are negative.  EKGs/Labs/Other Studies Reviewed:    The following studies were reviewed today:  November 19, 2023 ZIO monitor personally reviewed PVC burden 8.9%, 3 morphologies  September 25, 2023 echo EF 30-35 RV normal Severely dilated left atrium Mildly dilated right atrium Mild to moderate MR Mild AI  July 30, 2023 ZIO monitor 10.2% PVCs, 2 morphologies  Feb 27, 2023 ZIO monitor 15.2% PVCs  November 14, 2020 cardiac MRI EF 35 Basal septal mid wall LGE  July 07, 2023 EKG shows sinus rhythm, couplet of ventricular ectopy  Feb 07, 2023 EKG shows sinus rhythm  with frequent monomorphic PVCs.  PVCs have an inferior axis with precordial transition in V3.  EKG Interpretation Date/Time:  Monday December 04 2023 11:27:08 EST Ventricular Rate:  75 PR Interval:  138 QRS Duration:  90 QT Interval:  380 QTC Calculation: 424 R Axis:   18  Text Interpretation: Sinus rhythm with occasional Premature ventricular complexes Confirmed by Steffanie Dunn [phone]) on 12/04/2023 11:34:35 AM    Physical Exam:    VS:  BP 112/64   Pulse 75   Ht 5\' 4"  (1.626 m)   Wt 125 lb 9.6 oz (57 kg)   SpO2 97%   BMI 21.56 kg/m     Wt Readings from Last 3 Encounters:  12/04/23 125 lb 9.6 oz (57 kg)  10/13/23 126 lb 9.6 oz (57.4 kg)  07/07/23 123 lb 9.6 oz (56.1 kg)     GEN: no distress CARD: RRR, No MRG RESP: No IWOB. CTAB.        ASSESSMENT AND PLAN:    1. PVCs (premature ventricular contractions)   2. Chronic systolic heart failure (HCC)     #Chronic systolic heart failure #Frequent PVCs The patient has NYHA class II-III heart failure.  She follows with Dr. Gala Romney.  She has a persistently reduced left ventricular ejection fraction.  Her EF was initially found to be low several years ago.  Her PVC burden has fluctuated over the last couple of years peaking  at 15% but her EF has remained low.  It seems that her PVCs may be a secondary phenomenon and not the primary driver of her arrhythmia.  Given the variable frequency of PVCs, I do not think PVC ablation is indicated at this time.  I have recommended continued medical therapy for her systolic heart failure.  Recommend continuing mexiletine given she subjectively thinks the PVCs have improved on this drug.  I also discussed her systolic heart failure and risk for sudden cardiac death during today's clinic appointment.  I recommended ICD implant at this time.  She has a narrow complex QRS and no pacing needs.  Given her PVC burden has lessened at higher heart rates, favor having atrial pacing capacity.     Will plan for a Medtronic dual-chamber ICD.        Signed, Rossie Muskrat. Lalla Brothers, MD, [facility], [facility] 12/04/2023 11:34 AM    Electrophysiology [facility]

## 2023-12-04 ENCOUNTER — Other Ambulatory Visit: Payer: Self-pay

## 2023-12-04 ENCOUNTER — Ambulatory Visit: Payer: BC Managed Care – PPO | Attending: Cardiology | Admitting: Cardiology

## 2023-12-04 ENCOUNTER — Encounter: Payer: Self-pay | Admitting: Cardiology

## 2023-12-04 VITALS — BP 112/64 | HR 75 | Ht 64.0 in | Wt 125.6 lb

## 2023-12-04 DIAGNOSIS — I5022 Chronic systolic (congestive) heart failure: Secondary | ICD-10-CM

## 2023-12-04 DIAGNOSIS — I493 Ventricular premature depolarization: Secondary | ICD-10-CM | POA: Diagnosis not present

## 2023-12-04 NOTE — Patient Instructions (Signed)
 Medication Instructions:  Your physician recommends that you continue on your current medications as directed. Please refer to the Current Medication list given to you today.  *If you need a refill on your cardiac medications before your next appointment, please call your pharmacy*  Lab Work: BMET and CBC  Testing/Procedures: ICD Implant Your physician has recommended that you have a defibrillator inserted. An implantable cardioverter defibrillator (ICD) is a small device that is placed in your chest or, in rare cases, your abdomen. This device uses electrical pulses or shocks to help control life-threatening, irregular heartbeats that could lead the heart to suddenly stop beating (sudden cardiac arrest). Leads are attached to the ICD that goes into your heart. This is done in the hospital and usually requires an overnight stay. Please see the instruction sheet given to you today for more information.  Follow-Up: At [facility], you and your health needs are our priority.  As part of our continuing mission to provide you with exceptional heart care, we have created designated Provider Care Teams.  These Care Teams include your primary Cardiologist (physician) and Advanced Practice Providers (APPs -  Physician Assistants and Nurse Practitioners) who all work together to provide you with the care you need, when you need it.  Your next appointment:   We will call you to schedule your post-procedure appointments

## 2023-12-05 LAB — CBC WITH DIFFERENTIAL/PLATELET
Basophils Absolute: 0 10*3/uL (ref 0.0–0.2)
Basos: 1 %
EOS (ABSOLUTE): 0.1 10*3/uL (ref 0.0–0.4)
Eos: 2 %
Hematocrit: 42.8 % (ref 34.0–46.6)
Hemoglobin: 14.2 g/dL (ref 11.1–15.9)
Immature Grans (Abs): 0 10*3/uL (ref 0.0–0.1)
Immature Granulocytes: 0 %
Lymphocytes Absolute: 1.4 10*3/uL (ref 0.7–3.1)
Lymphs: 26 %
MCH: 31.7 pg (ref 26.6–33.0)
MCHC: 33.2 g/dL (ref 31.5–35.7)
MCV: 96 fL (ref 79–97)
Monocytes Absolute: 0.4 10*3/uL (ref 0.1–0.9)
Monocytes: 8 %
Neutrophils Absolute: 3.4 10*3/uL (ref 1.4–7.0)
Neutrophils: 63 %
Platelets: 240 10*3/uL (ref 150–450)
RBC: 4.48 x10E6/uL (ref 3.77–5.28)
RDW: 12 % (ref 11.7–15.4)
WBC: 5.4 10*3/uL (ref 3.4–10.8)

## 2023-12-05 LAB — BASIC METABOLIC PANEL
BUN/Creatinine Ratio: 13 (ref 12–28)
BUN: 12 mg/dL (ref 8–27)
CO2: 24 mmol/L (ref 20–29)
Calcium: 9.6 mg/dL (ref 8.7–10.3)
Chloride: 104 mmol/L (ref 96–106)
Creatinine, Ser: 0.94 mg/dL (ref 0.57–1.00)
Glucose: 155 mg/dL — ABNORMAL HIGH (ref 70–99)
Potassium: 4.1 mmol/L (ref 3.5–5.2)
Sodium: 143 mmol/L (ref 134–144)
eGFR: 68 mL/min/{1.73_m2} (ref >59)

## 2023-12-08 ENCOUNTER — Telehealth (HOSPITAL_COMMUNITY): Payer: Self-pay

## 2023-12-08 NOTE — Telephone Encounter (Signed)
 Call placed to patient to discuss upcoming procedure.   Confirmed patient is scheduled for a Implantable cardioverter defibrilator (ICD) on Thursday, March 13 with Dr. Steffanie Dunn. Instructed patient to arrive at the Main Entrance A at [facility]: [address] [place], [place] [address] and check in at Admitting at 8:00 AM.   Any recent signs of acute illness or been started on antibiotics? No Labs completed on 12/04/23 and acceptable.  Any new medications started? No Medications to hold? Farxiga 3 days Medication instructions:  On the morning of your procedure take all other morning medications No eating or drinking after midnight prior to procedure.   The night before your procedure and the morning of your procedure scrub your neck/chest with the CHG surgical soap.   Advised of plan to go home the same day and will only stay overnight if medically necessary. You MUST have a responsible adult to drive you home and MUST be with you the first 24 hours after you arrive home.  Patient verbalized understanding to all instructions provided and agreed to proceed with procedure.

## 2023-12-13 NOTE — Pre-Procedure Instructions (Signed)
Instructed patient on the following items: Arrival time 0800 Nothing to eat or drink after midnight No meds AM of procedure Responsible person to drive you home and stay with you for 24 hrs Wash with special soap night before and morning of procedure  

## 2023-12-14 ENCOUNTER — Ambulatory Visit (HOSPITAL_COMMUNITY)

## 2023-12-14 ENCOUNTER — Ambulatory Visit (HOSPITAL_COMMUNITY)
Admission: RE | Admit: 2023-12-14 | Discharge: 2023-12-14 | Disposition: A | Discharge status: Home / Self Care | Attending: Cardiology | Admitting: Cardiology

## 2023-12-14 ENCOUNTER — Other Ambulatory Visit: Payer: Self-pay

## 2023-12-14 ENCOUNTER — Encounter (HOSPITAL_COMMUNITY)
Admission: RE | Disposition: A | Discharge status: Home / Self Care | Payer: Self-pay | Source: Home / Self Care | Attending: Cardiology

## 2023-12-14 DIAGNOSIS — Z833 Family history of diabetes mellitus: Secondary | ICD-10-CM | POA: Diagnosis not present

## 2023-12-14 DIAGNOSIS — I493 Ventricular premature depolarization: Secondary | ICD-10-CM | POA: Diagnosis not present

## 2023-12-14 DIAGNOSIS — R7989 Other specified abnormal findings of blood chemistry: Secondary | ICD-10-CM | POA: Diagnosis not present

## 2023-12-14 DIAGNOSIS — Z7901 Long term (current) use of anticoagulants: Secondary | ICD-10-CM | POA: Diagnosis not present

## 2023-12-14 DIAGNOSIS — I5022 Chronic systolic (congestive) heart failure: Secondary | ICD-10-CM | POA: Insufficient documentation

## 2023-12-14 DIAGNOSIS — Z8249 Family history of ischemic heart disease and other diseases of the circulatory system: Secondary | ICD-10-CM | POA: Diagnosis not present

## 2023-12-14 DIAGNOSIS — I429 Cardiomyopathy, unspecified: Secondary | ICD-10-CM | POA: Insufficient documentation

## 2023-12-14 DIAGNOSIS — Z888 Allergy status to other drugs, medicaments and biological substances status: Secondary | ICD-10-CM | POA: Diagnosis not present

## 2023-12-14 DIAGNOSIS — E039 Hypothyroidism, unspecified: Secondary | ICD-10-CM | POA: Diagnosis not present

## 2023-12-14 DIAGNOSIS — I517 Cardiomegaly: Secondary | ICD-10-CM | POA: Diagnosis not present

## 2023-12-14 DIAGNOSIS — Z95810 Presence of automatic (implantable) cardiac defibrillator: Secondary | ICD-10-CM | POA: Diagnosis not present

## 2023-12-14 DIAGNOSIS — Z882 Allergy status to sulfonamides status: Secondary | ICD-10-CM | POA: Diagnosis not present

## 2023-12-14 DIAGNOSIS — R918 Other nonspecific abnormal finding of lung field: Secondary | ICD-10-CM | POA: Diagnosis not present

## 2023-12-14 DIAGNOSIS — I428 Other cardiomyopathies: Secondary | ICD-10-CM | POA: Diagnosis not present

## 2023-12-14 DIAGNOSIS — Z79899 Other long term (current) drug therapy: Secondary | ICD-10-CM | POA: Diagnosis not present

## 2023-12-14 DIAGNOSIS — I4891 Unspecified atrial fibrillation: Secondary | ICD-10-CM | POA: Diagnosis not present

## 2023-12-14 DIAGNOSIS — I480 Paroxysmal atrial fibrillation: Secondary | ICD-10-CM | POA: Diagnosis not present

## 2023-12-14 DIAGNOSIS — E785 Hyperlipidemia, unspecified: Secondary | ICD-10-CM | POA: Diagnosis not present

## 2023-12-14 DIAGNOSIS — J9811 Atelectasis: Secondary | ICD-10-CM | POA: Diagnosis not present

## 2023-12-14 DIAGNOSIS — R0602 Shortness of breath: Secondary | ICD-10-CM | POA: Diagnosis not present

## 2023-12-14 DIAGNOSIS — J90 Pleural effusion, not elsewhere classified: Secondary | ICD-10-CM | POA: Diagnosis not present

## 2023-12-14 DIAGNOSIS — Z885 Allergy status to narcotic agent status: Secondary | ICD-10-CM | POA: Diagnosis not present

## 2023-12-14 HISTORY — PX: ICD IMPLANT: EP1208

## 2023-12-14 SURGERY — ICD IMPLANT
Anesthesia: LOCAL

## 2023-12-14 MED ORDER — SODIUM CHLORIDE 0.9 % IV SOLN
80.0000 mg | INTRAVENOUS | Status: AC
  Administered 2023-12-14: 80 mg

## 2023-12-14 MED ORDER — HEPARIN (PORCINE) IN NACL 1000-0.9 UT/500ML-% IV SOLN
INTRAVENOUS | Status: DC | PRN
  Administered 2023-12-14: 500 mL

## 2023-12-14 MED ORDER — ONDANSETRON HCL 4 MG/2ML IJ SOLN: 4.0000 mg | Freq: Four times a day (QID) | INTRAMUSCULAR | Status: DC | PRN

## 2023-12-14 MED ORDER — CHLORHEXIDINE GLUCONATE 4 % EX SOLN: 4.0000 | Freq: Once | CUTANEOUS | Status: DC

## 2023-12-14 MED ORDER — MIDAZOLAM HCL 2 MG/2ML IJ SOLN
INTRAMUSCULAR | Status: AC
  Filled 2023-12-14: qty 2

## 2023-12-14 MED ORDER — SODIUM CHLORIDE 0.9 % IV SOLN: INTRAVENOUS | Status: DC

## 2023-12-14 MED ORDER — LIDOCAINE HCL (PF) 1 % IJ SOLN
INTRAMUSCULAR | Status: DC | PRN
  Administered 2023-12-14: 50 mL

## 2023-12-14 MED ORDER — FENTANYL CITRATE (PF) 100 MCG/2ML IJ SOLN
INTRAMUSCULAR | Status: AC
  Filled 2023-12-14: qty 2

## 2023-12-14 MED ORDER — FENTANYL CITRATE (PF) 100 MCG/2ML IJ SOLN
INTRAMUSCULAR | Status: DC | PRN
  Administered 2023-12-14 (×2): 25 ug via INTRAVENOUS

## 2023-12-14 MED ORDER — ACETAMINOPHEN 325 MG PO TABS
325.0000 mg | ORAL_TABLET | ORAL | Status: DC | PRN
  Administered 2023-12-14: 650 mg via ORAL
  Filled 2023-12-14: qty 2

## 2023-12-14 MED ORDER — CEFAZOLIN SODIUM-DEXTROSE 2-4 GM/100ML-% IV SOLN
INTRAVENOUS | Status: AC
  Filled 2023-12-14: qty 100

## 2023-12-14 MED ORDER — POVIDONE-IODINE 10 % EX SWAB: 2.0000 | Freq: Once | CUTANEOUS | Status: DC

## 2023-12-14 MED ORDER — MIDAZOLAM HCL 5 MG/5ML IJ SOLN
INTRAMUSCULAR | Status: DC | PRN
Start: 2023-12-14 — End: 2023-12-14
  Administered 2023-12-14 (×2): 1 mg via INTRAVENOUS

## 2023-12-14 MED ORDER — CEFAZOLIN SODIUM-DEXTROSE 2-4 GM/100ML-% IV SOLN
2.0000 g | INTRAVENOUS | Status: AC
  Administered 2023-12-14: 2 g via INTRAVENOUS

## 2023-12-14 SURGICAL SUPPLY — 8 items
CABLE SURGICAL S-101-97-12 (CABLE) ×1 IMPLANT
ICD COBALT XT DR DDPA2D4 (ICD Generator) IMPLANT
LEAD CAPSURE NOVUS 5076-52CM (Lead) IMPLANT
LEAD SPRINT QUAT SEC 6935M-62 (Lead) IMPLANT
PAD DEFIB RADIO PHYSIO CONN (PAD) ×1 IMPLANT
SHEATH 7FR PRELUDE SNAP 13 (SHEATH) IMPLANT
SHEATH 9FR PRELUDE SNAP 13 (SHEATH) IMPLANT
TRAY PACEMAKER INSERTION (PACKS) ×1 IMPLANT

## 2023-12-14 NOTE — Interval H&P Note (Signed)
 History and Physical Interval Note:  12/14/2023 8:29 AM  Janet Ramirez  has presented today for surgery, with the diagnosis of cardiomyopathy.  The various methods of treatment have been discussed with the patient and family. After consideration of risks, benefits and other options for treatment, the patient has consented to  Procedure(s): ICD IMPLANT (N/A) as a surgical intervention.  The patient's history has been reviewed, patient examined, no change in status, stable for surgery.  I have reviewed the patient's chart and labs.  Questions were answered to the patient's satisfaction.     Benicio Manna T Kedrick Mcnamee

## 2023-12-14 NOTE — Discharge Instructions (Addendum)
 After Your ICD (Implantable Cardiac Defibrillator)   You have a Medtronic ICD  ACTIVITY Do not lift your arm above shoulder height for 1 week after your procedure. After 7 days, you may progress as below.  You should remove your sling 24 hours after your procedure, unless otherwise instructed by your provider.     Thursday December 21, 2023  Friday December 22, 2023 Saturday December 23, 2023 Sunday December 24, 2023   Do not lift, push, pull, or carry anything over 10 pounds with the affected arm until 6 weeks (Thursday January 25, 2024 ) after your procedure.   You may drive AFTER your wound check, unless you have been told otherwise by your provider.   Ask your healthcare provider when you can go back to work   INCISION/Dressing  If large square, outer bandage is left in place, this can be removed after 24 hours from your procedure. Do not remove steri-strips or glue as below.   Monitor your defibrillator site for redness, swelling, and drainage. Call the device clinic at [phone] if you experience these symptoms or fever/chills.  If your incision is sealed with Steri-strips or staples, you may shower 7 days after your procedure or when told by your provider. Do not remove the steri-strips or let the shower hit directly on your site. You may wash around your site with soap and water.    If you were discharged in a sling, please do not wear this during the day more than 48 hours after your surgery unless otherwise instructed. This may increase the risk of stiffness and soreness in your shoulder.   Avoid lotions, ointments, or perfumes over your incision until it is well-healed.  You may use a hot tub or a pool AFTER your wound check appointment if the incision is completely closed.  Your ICD is designed to protect you from life threatening heart rhythms. Because of this, you may receive a shock.   1 shock with no symptoms:  Call the office during business hours. 1 shock with symptoms  (chest pain, chest pressure, dizziness, lightheadedness, shortness of breath, overall feeling unwell):  Call 911. If you experience 2 or more shocks in 24 hours:  Call 911. If you receive a shock, you should not drive for 6 months per the [place] DMV IF you receive appropriate therapy from your ICD.   ICD Alerts:  Some alerts are vibratory and others beep. These are NOT emergencies. Please call our office to let us know. If this occurs at night or on weekends, it can wait until the next business day. Send a remote transmission.  If your device is capable of reading fluid status (for heart failure), you will be offered monthly monitoring to review this with you.   DEVICE MANAGEMENT Remote monitoring is used to monitor your ICD from home. This monitoring is scheduled every 91 days by our office. It allows [place] to keep an eye on the functioning of your device to ensure it is working properly. You will routinely see your Electrophysiologist annually (more often if necessary).   You should receive your ID card for your new device in 4-8 weeks. Keep this card with you at all times once received. Consider wearing a medical alert bracelet or necklace.  Your ICD  may be MRI compatible. This will be discussed at your next office visit/wound check.  You should avoid contact with strong electric or magnetic fields.   Do not use amateur (ham) radio equipment or electric (arc)  welding torches. MP3 player headphones with magnets should not be used. Some devices are safe to use if held at least 12 inches (30 cm) from your defibrillator. These include power tools, lawn mowers, and speakers. If you are unsure if something is safe to use, ask your health care provider.  When using your cell phone, hold it to the ear that is on the opposite side from the defibrillator. Do not leave your cell phone in a pocket over the defibrillator.  You may safely use electric blankets, heating pads, computers, and microwave ovens.  Call  the office right away if: You have chest pain. You feel more than one shock. You feel more short of breath than you have felt before. You feel more light-headed than you have felt before. Your incision starts to open up.  This information is not intended to replace advice given to you by your health care provider. Make sure you discuss any questions you have with your health care provider.

## 2023-12-15 ENCOUNTER — Encounter (HOSPITAL_COMMUNITY): Payer: Self-pay | Admitting: Cardiology

## 2023-12-15 NOTE — Addendum Note (Signed)
 Encounter addended by: Howell Rucks, RDCS on: 12/15/2023 2:06 PM  Actions taken: Imaging Exam ended

## 2023-12-17 ENCOUNTER — Emergency Department (HOSPITAL_COMMUNITY)

## 2023-12-17 ENCOUNTER — Other Ambulatory Visit: Payer: Self-pay

## 2023-12-17 ENCOUNTER — Inpatient Hospital Stay (HOSPITAL_COMMUNITY)
Admission: EM | Admit: 2023-12-17 | Discharge: 2023-12-19 | DRG: 277 | Disposition: A | Discharge status: Home / Self Care | Attending: Internal Medicine | Admitting: Internal Medicine

## 2023-12-17 ENCOUNTER — Encounter (HOSPITAL_COMMUNITY): Payer: Self-pay | Admitting: *Deleted

## 2023-12-17 ENCOUNTER — Telehealth: Payer: Self-pay | Admitting: Physician Assistant

## 2023-12-17 DIAGNOSIS — Z833 Family history of diabetes mellitus: Secondary | ICD-10-CM | POA: Diagnosis not present

## 2023-12-17 DIAGNOSIS — R918 Other nonspecific abnormal finding of lung field: Secondary | ICD-10-CM | POA: Diagnosis not present

## 2023-12-17 DIAGNOSIS — Z7901 Long term (current) use of anticoagulants: Secondary | ICD-10-CM | POA: Diagnosis not present

## 2023-12-17 DIAGNOSIS — Z888 Allergy status to other drugs, medicaments and biological substances status: Secondary | ICD-10-CM

## 2023-12-17 DIAGNOSIS — J9811 Atelectasis: Secondary | ICD-10-CM | POA: Diagnosis not present

## 2023-12-17 DIAGNOSIS — E785 Hyperlipidemia, unspecified: Secondary | ICD-10-CM | POA: Diagnosis present

## 2023-12-17 DIAGNOSIS — Z79899 Other long term (current) drug therapy: Secondary | ICD-10-CM

## 2023-12-17 DIAGNOSIS — Z882 Allergy status to sulfonamides status: Secondary | ICD-10-CM | POA: Diagnosis not present

## 2023-12-17 DIAGNOSIS — R7989 Other specified abnormal findings of blood chemistry: Secondary | ICD-10-CM | POA: Diagnosis present

## 2023-12-17 DIAGNOSIS — Z885 Allergy status to narcotic agent status: Secondary | ICD-10-CM | POA: Diagnosis not present

## 2023-12-17 DIAGNOSIS — E039 Hypothyroidism, unspecified: Secondary | ICD-10-CM | POA: Diagnosis present

## 2023-12-17 DIAGNOSIS — I493 Ventricular premature depolarization: Secondary | ICD-10-CM | POA: Diagnosis present

## 2023-12-17 DIAGNOSIS — I428 Other cardiomyopathies: Secondary | ICD-10-CM | POA: Diagnosis present

## 2023-12-17 DIAGNOSIS — I5021 Acute systolic (congestive) heart failure: Secondary | ICD-10-CM

## 2023-12-17 DIAGNOSIS — R0602 Shortness of breath: Secondary | ICD-10-CM | POA: Diagnosis not present

## 2023-12-17 DIAGNOSIS — Z95810 Presence of automatic (implantable) cardiac defibrillator: Secondary | ICD-10-CM | POA: Diagnosis not present

## 2023-12-17 DIAGNOSIS — I5022 Chronic systolic (congestive) heart failure: Secondary | ICD-10-CM | POA: Diagnosis present

## 2023-12-17 DIAGNOSIS — I4891 Unspecified atrial fibrillation: Principal | ICD-10-CM | POA: Diagnosis present

## 2023-12-17 DIAGNOSIS — I480 Paroxysmal atrial fibrillation: Secondary | ICD-10-CM | POA: Diagnosis present

## 2023-12-17 DIAGNOSIS — Z8249 Family history of ischemic heart disease and other diseases of the circulatory system: Secondary | ICD-10-CM

## 2023-12-17 DIAGNOSIS — J90 Pleural effusion, not elsewhere classified: Secondary | ICD-10-CM | POA: Diagnosis not present

## 2023-12-17 DIAGNOSIS — I517 Cardiomegaly: Secondary | ICD-10-CM | POA: Diagnosis not present

## 2023-12-17 LAB — BASIC METABOLIC PANEL
Anion gap: 12 (ref 5–15)
BUN: 16 mg/dL (ref 8–23)
CO2: 22 mmol/L (ref 22–32)
Calcium: 9.4 mg/dL (ref 8.9–10.3)
Chloride: 103 mmol/L (ref 98–111)
Creatinine, Ser: 0.87 mg/dL (ref 0.44–1.00)
GFR, Estimated: >60 mL/min (ref >60)
Glucose, Bld: 107 mg/dL — ABNORMAL HIGH (ref 70–99)
Potassium: 4 mmol/L (ref 3.5–5.1)
Sodium: 137 mmol/L (ref 135–145)

## 2023-12-17 LAB — CBC
HCT: 38.7 % (ref 36.0–46.0)
Hemoglobin: 13 g/dL (ref 12.0–15.0)
MCH: 32 pg (ref 26.0–34.0)
MCHC: 33.6 g/dL (ref 30.0–36.0)
MCV: 95.3 fL (ref 80.0–100.0)
Platelets: 240 10*3/uL (ref 150–400)
RBC: 4.06 MIL/uL (ref 3.87–5.11)
RDW: 12.8 % (ref 11.5–15.5)
WBC: 10.1 10*3/uL (ref 4.0–10.5)
nRBC: 0 % (ref 0.0–0.2)

## 2023-12-17 LAB — HEMOGLOBIN A1C
Hgb A1c MFr Bld: 5.2 % (ref 4.8–5.6)
Mean Plasma Glucose: 102.54 mg/dL

## 2023-12-17 LAB — MAGNESIUM
Magnesium: 2.2 mg/dL (ref 1.7–2.4)
Magnesium: 2.1 mg/dL (ref 1.7–2.4)

## 2023-12-17 LAB — TSH: TSH: 2.471 u[IU]/mL (ref 0.350–4.500)

## 2023-12-17 LAB — TROPONIN I (HIGH SENSITIVITY)
Troponin I (High Sensitivity): 2462 ng/L (ref <18)
Troponin I (High Sensitivity): 2267 ng/L (ref <18)

## 2023-12-17 LAB — PHOSPHORUS: Phosphorus: 2.3 mg/dL — ABNORMAL LOW (ref 2.5–4.6)

## 2023-12-17 MED ORDER — METOPROLOL TARTRATE 25 MG PO TABS
25.0000 mg | ORAL_TABLET | Freq: Four times a day (QID) | ORAL | Status: DC
  Administered 2023-12-18: 25 mg via ORAL
  Filled 2023-12-17: qty 1

## 2023-12-17 MED ORDER — HEPARIN (PORCINE) 25000 UT/250ML-% IV SOLN
800.0000 [IU]/h | INTRAVENOUS | Status: DC
  Administered 2023-12-17: 800 [IU]/h via INTRAVENOUS
  Filled 2023-12-17: qty 250

## 2023-12-17 MED ORDER — ONDANSETRON HCL 4 MG PO TABS: 4.0000 mg | ORAL_TABLET | Freq: Four times a day (QID) | ORAL | Status: DC | PRN

## 2023-12-17 MED ORDER — ONDANSETRON HCL 4 MG/2ML IJ SOLN: 4.0000 mg | Freq: Four times a day (QID) | INTRAMUSCULAR | Status: DC | PRN

## 2023-12-17 MED ORDER — ETOMIDATE 2 MG/ML IV SOLN
INTRAVENOUS | Status: AC | PRN
Start: 2023-12-17 — End: 2023-12-17
  Administered 2023-12-17: 4 mg via INTRAVENOUS

## 2023-12-17 MED ORDER — METOPROLOL TARTRATE 5 MG/5ML IV SOLN: 2.5000 mg | INTRAVENOUS | Status: DC | PRN

## 2023-12-17 MED ORDER — HEPARIN BOLUS VIA INFUSION
3500.0000 [IU] | Freq: Once | INTRAVENOUS | Status: AC
  Administered 2023-12-17: 3500 [IU] via INTRAVENOUS
  Filled 2023-12-17: qty 3500

## 2023-12-17 MED ORDER — ETOMIDATE 2 MG/ML IV SOLN
5.0000 mg | Freq: Once | INTRAVENOUS | Status: AC
  Administered 2023-12-17: 5 mg via INTRAVENOUS
  Filled 2023-12-17: qty 10

## 2023-12-17 NOTE — Sedation Documentation (Signed)
 [employer] on [employer] on.

## 2023-12-17 NOTE — Telephone Encounter (Signed)
 Pt called stating her HR 140-150s and BP monitor will not register. She is not dizzy or lightheaded, only back discomfort after ICD implantation. With her kardia device, she was able to confirm HR 150, Afib not confirmed but "doesn't fall under normal tachycardia."   I am concerned that she may be in a Fib or flutter with HR in the 150s. I am also concerned her BP monitor is unable to capture a reading. She confirmed she took mexilitine and coreg today as directed. I asked her to send a manual transmission from her ICD and to proceed to the ER. They will come to [facility].   Marcelino Duster, PA-C 12/17/2023, 5:03 PM [phone] [facility] [address] [place], [place] [address]

## 2023-12-17 NOTE — Sedation Documentation (Signed)
 200j shock

## 2023-12-17 NOTE — ED Provider Notes (Signed)
 [place] EMERGENCY DEPARTMENT AT [facility] Provider Note   CSN: [id number] Arrival date & time: 12/17/23  1757     History  Chief Complaint  Patient presents with   Tachycardia    Janet Ramirez is a 63 y.o. female.  HPI Patient with recent placement of AICD 3 days ago.  Began to feel heart racing today.  Going around 150.  Sent transmission from pacemaker but cardiology unable to see it.  Sent in for further evaluation.  No fevers.  No swelling in her legs.   Past Medical History:  Diagnosis Date   CHF (congestive heart failure) (HCC)    Hyperlipidemia    Hypothyroidism    PVC (premature ventricular contraction)     Home Medications Prior to Admission medications   Medication Sig Start Date End Date Taking? Authorizing Provider  Calcium & Magnesium Carbonates (MYLANTA PO) Take 1 tablet by mouth daily as needed (indigestion).    [provider]  carvedilol (COREG) 3.125 MG tablet Take 1 tablet (3.125 mg total) by mouth 2 (two) times daily with a meal. 02/07/23   Bensimhon, Bevelyn Buckles, MD  dapagliflozin propanediol (FARXIGA) 10 MG TABS tablet Take 10 mg by mouth daily.    [provider]  Lactobacillus (PROBIOTIC ACIDOPHILUS PO) Take 1 tablet by mouth daily.    [provider]  losartan (COZAAR) 25 MG tablet Take 1 tablet (25 mg total) by mouth daily. 04/10/23   Bensimhon, Bevelyn Buckles, MD  mexiletine (MEXITIL) 150 MG capsule Take 1 capsule (150 mg total) by mouth 2 (two) times daily. 07/07/23   Bensimhon, Bevelyn Buckles, MD  Multiple Vitamins-Minerals (MULTIVITAMIN WITH MINERALS) tablet Take 1 tablet by mouth daily.    [provider]  simethicone (MYLICON) 125 MG chewable tablet Chew 125 mg by mouth every 6 (six) hours as needed for flatulence.    [provider]      Allergies    Codeine, Spironolactone, and Sulfacetamide sodium    Review of Systems   Review of Systems  Physical Exam Updated Vital Signs BP (!) 107/97   Pulse (!)  144   Temp 98.8 F (37.1 C) (Oral)   Resp 20   Ht 5\' 4"  (1.626 m)   Wt 55.3 kg   SpO2 100%   BMI 20.93 kg/m  Physical Exam Vitals and nursing note reviewed.  Cardiovascular:     Rate and Rhythm: Tachycardia present. Rhythm irregular.  Pulmonary:     Breath sounds: No wheezing.  Abdominal:     Tenderness: There is no abdominal tenderness.  Musculoskeletal:     Cervical back: Neck supple.  Skin:    Capillary Refill: Capillary refill takes less than 2 seconds.  Neurological:     Mental Status: She is alert.     ED Results / Procedures / Treatments   Labs (all labs ordered are listed, but only abnormal results are displayed) Labs Reviewed  BASIC METABOLIC PANEL - Abnormal; Notable for the following components:      Result Value   Glucose, Bld 107 (*)    All other components within normal limits  TROPONIN I (HIGH SENSITIVITY) - Abnormal; Notable for the following components:   Troponin I (High Sensitivity) 2,462 (*)    All other components within normal limits  CBC  MAGNESIUM  TROPONIN I (HIGH SENSITIVITY)    EKG EKG Interpretation Date/Time:  Sunday December 17 2023 18:06:17 EDT Ventricular Rate:  152 PR Interval:    QRS Duration:  88 QT  Interval:  292 QTC Calculation: 464 R Axis:   36  Text Interpretation: Atrial fibrillation with rapid ventricular response vs atrial tachycardia Septal infarct , age undetermined ST & T wave abnormality, consider lateral ischemia Abnormal ECG When compared with ECG of 04-Dec-2023 11:27,  afib is new Reconfirmed by Benjiman Core [phone]) on 12/17/2023 7:17:59 PM  Radiology DG Chest Portable 1 View Result Date: 12/17/2023 CLINICAL DATA:  Short of breath, left upper back pain, tachycardia EXAM: PORTABLE CHEST 1 VIEW COMPARISON:  12/14/2023 FINDINGS: Single frontal view of the chest demonstrates stable dual lead pacer/AICD. The cardiac silhouette is unchanged. Interval development of bibasilar consolidation and small bilateral pleural  effusions. No pneumothorax. No acute bony abnormalities. IMPRESSION: 1. Interval development of small bilateral pleural effusions and bibasilar consolidation. 2. Stable enlarged cardiac silhouette. Electronically Signed   By: Sharlet Salina M.D.   On: 12/17/2023 19:05    Procedures Procedures    Medications Ordered in ED Medications  etomidate (AMIDATE) injection 5 mg (5 mg Intravenous Given by Other 12/17/23 2024)  etomidate (AMIDATE) injection (4 mg Intravenous Given 12/17/23 2027)    ED Course/ Medical Decision Making/ A&P         CHA2DS2-VASc Score: 4                        Medical Decision Making Amount and/or Complexity of Data Reviewed Labs: ordered. Radiology: ordered.   Patient with irregular tachycardia.  Potentially A-fib.  Recent AICD placement.  Differential diagnosis does include other causes such as SVT.  Discussed with Dr. Daphine Deutscher from cardiology.  Request interrogation of pacemaker and potential cardioversion.  Will get basic blood work also.  Patient has a Medtronic AICD and will interrogate.  Discussed with pacemaker rep.  Has been in tachycardia with an atrial source.  Atrial rates are 240-300.  More likely A-fib or a flutter.   Discussed with Dr. Daphine Deutscher again.  Requested cardioversion.  Cardioversion done at 200 without correction of arrhythmia.  Discussed with cardiology again.  Request admission to internal medicine.  Request starting oral metoprolol 25 mg every 6.  Can be seen by EP and/or heart failure tomorrow.   CRITICAL CARE Performed by: Benjiman Core Total critical care time: 30 minutes Critical care time was exclusive of separately billable procedures and treating other patients. Critical care was necessary to treat or prevent imminent or life-threatening deterioration. Critical care was time spent personally by me on the following activities: development of treatment plan with patient and/or surrogate as well as nursing, discussions with  consultants, evaluation of patient's response to treatment, examination of patient, obtaining history from patient or surrogate, ordering and performing treatments and interventions, ordering and review of laboratory studies, ordering and review of radiographic studies, pulse oximetry and re-evaluation of patient's condition.         Final Clinical Impression(s) / ED Diagnoses Final diagnoses:  Atrial fibrillation, unspecified type [facility])    Rx / DC Orders ED Discharge Orders     None         Benjiman Core, MD 12/17/23 2039

## 2023-12-17 NOTE — Progress Notes (Signed)
 PHARMACY - ANTICOAGULATION CONSULT NOTE  Pharmacy Consult for IV heparin Indication: atrial fibrillation  Allergies  Allergen Reactions   Codeine Itching   Spironolactone     Changed vision, severe headaches    Sulfacetamide Sodium Diarrhea    Patient Measurements: Height: 5\' 4"  (162.6 cm) Weight: 55.3 kg (121 lb 14.6 oz) IBW/kg (Calculated) : 54.7 Heparin Dosing Weight: 55.3 kg  Vital Signs: Temp: 98.8 F (37.1 C) (03/16 2030) Temp Source: Oral (03/16 2030) BP: 109/92 (03/16 2230) Pulse Rate: 34 (03/16 2230)  Labs: Recent Labs    12/17/23 1843 12/17/23 2009  HGB 13.0  --   HCT 38.7  --   PLT 240  --   CREATININE 0.87  --   TROPONINIHS 2,462* 2,267*    Estimated Creatinine Clearance: 57.2 mL/min (by C-G formula based on SCr of 0.87 mg/dL).   Medical History: Past Medical History:  Diagnosis Date   CHF (congestive heart failure) (HCC)    Hyperlipidemia    Hypothyroidism    PVC (premature ventricular contraction)     Assessment: Janet Ramirez is a 63 y.o. year old female admitted on 12/17/2023 with concern for afib. No anticoagulation prior to admission. Pharmacy consulted to dose heparin.  Goal of Therapy:  Heparin level 0.3-0.7 units/ml Monitor platelets by anticoagulation protocol: Yes   Plan:  Heparin 3500 units x 1 as bolus followed by heparin infusion at 800 units/hr 8 heparin level  Daily heparin level, CBC, and monitoring for bleeding F/u plans for anticoagulation and cards recs  Thank you for allowing pharmacy to participate in this patient's care.  Marja Kays, PharmD Emergency Medicine Clinical Pharmacist 12/17/2023,11:04 PM

## 2023-12-17 NOTE — Sedation Documentation (Signed)
 Sync on; 200j again shock

## 2023-12-17 NOTE — Sedation Documentation (Signed)
 Zoll pads applied, Zoll on.

## 2023-12-17 NOTE — ED Triage Notes (Signed)
 The pt is c/o a high heart rate since this am  she has a history of af and she just had a pacemaker placed lt shoulder this past Thursday  sl pain now

## 2023-12-17 NOTE — ED Notes (Signed)
 Charge nurse aware of the need of a room for this pt

## 2023-12-17 NOTE — H&P (Signed)
 History and Physical    Janet Ramirez ZOX:[id number] DOB: December 29, 1960 DOA: 12/17/2023  PCP: [facility]  Patient coming from: home  I have personally briefly reviewed patient's old medical records in [facility]  Chief Complaint:   HPI: Janet Ramirez is a 63 y.o. female with medical history significant of  chronic systolic heart failure ef 25% with PVCs , HLD, hypothyroidism with recent interim history of ICD placement 12/14/2023  due to high burden pvc  on last zio. Patient today had acute onset of palpitations with hr in 140-150's, without associated symptoms of presyncope/chest pain or sob.  ON her Kardia device she was able to confirm her HR of 150. Patient called cardiology office and was referred to  ED.  ED Course:  IN ED patient was found to be in Afib RVR , cardioversion attempted x 3 without success Afeb, bp 123/88, hr 54, rr 18 sat 98%  EKG: Afib  at 146, pvc  Wbc: 10.1, hgb 13, plt 240 Mag 2.2  CE 2462 CXR  MPRESSION: 1. Interval development of small bilateral pleural effusions and bibasilar consolidation. 2. Stable enlarged cardiac silhouette.  CE 8119,1478 Review of Systems: As per HPI otherwise 10 point review of systems negative.   Past Medical History:  Diagnosis Date   CHF (congestive heart failure) (HCC)    Hyperlipidemia    Hypothyroidism    PVC (premature ventricular contraction)     Past Surgical History:  Procedure Laterality Date   ABDOMINAL HYSTERECTOMY  1980s   ICD IMPLANT N/A 12/14/2023   Procedure: ICD IMPLANT;  Surgeon: Lanier Prude, MD;  Location: [facility] INVASIVE CV LAB;  Service: Cardiovascular;  Laterality: N/A;   RIGHT/LEFT HEART CATH AND CORONARY ANGIOGRAPHY N/A 09/28/2020   Procedure: RIGHT/LEFT HEART CATH AND CORONARY ANGIOGRAPHY;  Surgeon: Swaziland, Peter M, MD;  Location: [facility] INVASIVE CV LAB;  Service: Cardiovascular;  Laterality: N/A;     reports that she has never smoked. She has never used smokeless tobacco. She  reports current alcohol use. She reports that she does not use drugs.  Allergies  Allergen Reactions   Codeine Itching   Spironolactone     Changed vision, severe headaches    Sulfacetamide Sodium Diarrhea    Family History  Problem Relation Age of Onset   Hypertension Mother    Heart disease Father        s/p stent and pacemaker placed in his 59s   Diabetes Mellitus II Father     Prior to Admission medications   Medication Sig Start Date End Date Taking? Authorizing Provider  Calcium & Magnesium Carbonates (MYLANTA PO) Take 1 tablet by mouth daily as needed (indigestion).   Yes [provider]  carvedilol (COREG) 3.125 MG tablet Take 1 tablet (3.125 mg total) by mouth 2 (two) times daily with a meal. 02/07/23  Yes Bensimhon, Bevelyn Buckles, MD  dapagliflozin propanediol (FARXIGA) 10 MG TABS tablet Take 5 mg by mouth in the morning and at bedtime.   Yes [provider]  Lactobacillus (PROBIOTIC ACIDOPHILUS PO) Take 1 tablet by mouth daily.   Yes [provider]  losartan (COZAAR) 25 MG tablet Take 1 tablet (25 mg total) by mouth daily. 04/10/23  Yes Bensimhon, Bevelyn Buckles, MD  mexiletine (MEXITIL) 150 MG capsule Take 1 capsule (150 mg total) by mouth 2 (two) times daily. 07/07/23  Yes Bensimhon, Bevelyn Buckles, MD  Multiple Vitamins-Minerals (MULTIVITAMIN WITH MINERALS) tablet Take 1 tablet by mouth daily.   Yes  [provider]  simethicone (MYLICON) 125 MG chewable tablet Chew 125 mg by mouth every 6 (six) hours as needed for flatulence.   Yes [provider]    Physical Exam: Vitals:   12/17/23 2025 12/17/23 2030 12/17/23 2045 12/17/23 2100  BP: (!) 108/90 (!) 107/97 106/85 (!) 115/91  Pulse: (!) 141 (!) 144 (!) 54 (!) 135  Resp: 14 20 16 17   Temp:  98.8 F (37.1 C)    TempSrc:  Oral    SpO2: 100% 100% 100% 100%  Weight:      Height:        Constitutional: NAD, calm, comfortable Vitals:   12/17/23 2025 12/17/23 2030 12/17/23 2045 12/17/23 2100   BP: (!) 108/90 (!) 107/97 106/85 (!) 115/91  Pulse: (!) 141 (!) 144 (!) 54 (!) 135  Resp: 14 20 16 17   Temp:  98.8 F (37.1 C)    TempSrc:  Oral    SpO2: 100% 100% 100% 100%  Weight:      Height:       Eyes: PERRL, lids and conjunctivae normal ENMT: Mucous membranes are moist. Posterior pharynx clear of any exudate or lesions.Normal dentition.  Neck: normal, supple, no masses, no thyromegaly Respiratory: clear to auscultation bilaterally, no wheezing, no crackles. Normal respiratory effort. No accessory muscle use.  Cardiovascular: Regular rate and rhythm, no murmurs / rubs / gallops. No extremity edema. 2+ pedal pulses. No carotid bruits.  Abdomen: no tenderness, no masses palpated. No hepatosplenomegaly. Bowel sounds positive.  Musculoskeletal: no clubbing / cyanosis. No joint deformity upper and lower extremities. Good ROM, no contractures. Normal muscle tone.  Skin: no rashes, lesions, ulcers. No induration Neurologic: CN 2-12 grossly intact. Sensation intact, DTR normal. Strength 5/5 in all 4.  Psychiatric: Normal judgment and insight. Alert and oriented x 3. Normal mood.    Labs on Admission: I have personally reviewed following labs and imaging studies  CBC: Recent Labs  Lab 12/17/23 1843  WBC 10.1  HGB 13.0  HCT 38.7  MCV 95.3  PLT 240   Basic Metabolic Panel: Recent Labs  Lab 12/17/23 1843  NA 137  K 4.0  CL 103  CO2 22  GLUCOSE 107*  BUN 16  CREATININE 0.87  CALCIUM 9.4  MG 2.2   GFR: Estimated Creatinine Clearance: 57.2 mL/min (by C-G formula based on SCr of 0.87 mg/dL). Liver Function Tests: No results for input(s): "AST", "ALT", "ALKPHOS", "BILITOT", "PROT", "ALBUMIN" in the last 168 hours. No results for input(s): "LIPASE", "AMYLASE" in the last 168 hours. No results for input(s): "AMMONIA" in the last 168 hours. Coagulation Profile: No results for input(s): "INR", "PROTIME" in the last 168 hours. Cardiac Enzymes: No results for input(s):  "CKTOTAL", "CKMB", "CKMBINDEX", "TROPONINI" in the last 168 hours. BNP (last 3 results) No results for input(s): "PROBNP" in the last 8760 hours. HbA1C: No results for input(s): "HGBA1C" in the last 72 hours. CBG: No results for input(s): "GLUCAP" in the last 168 hours. Lipid Profile: No results for input(s): "CHOL", "HDL", "LDLCALC", "TRIG", "CHOLHDL", "LDLDIRECT" in the last 72 hours. Thyroid Function Tests: No results for input(s): "TSH", "T4TOTAL", "FREET4", "T3FREE", "THYROIDAB" in the last 72 hours. Anemia Panel: No results for input(s): "VITAMINB12", "FOLATE", "FERRITIN", "TIBC", "IRON", "RETICCTPCT" in the last 72 hours. Urine analysis: No results found for: "COLORURINE", "APPEARANCEUR", "LABSPEC", "PHURINE", "GLUCOSEU", "HGBUR", "BILIRUBINUR", "KETONESUR", "PROTEINUR", "UROBILINOGEN", "NITRITE", "LEUKOCYTESUR"  Radiological Exams on Admission: DG Chest Portable 1 View Result Date: 12/17/2023 CLINICAL DATA:  Short of breath, left  upper back pain, tachycardia EXAM: PORTABLE CHEST 1 VIEW COMPARISON:  12/14/2023 FINDINGS: Single frontal view of the chest demonstrates stable dual lead pacer/AICD. The cardiac silhouette is unchanged. Interval development of bibasilar consolidation and small bilateral pleural effusions. No pneumothorax. No acute bony abnormalities. IMPRESSION: 1. Interval development of small bilateral pleural effusions and bibasilar consolidation. 2. Stable enlarged cardiac silhouette. Electronically Signed   By: Sharlet Salina M.D.   On: 12/17/2023 19:05    EKG: Independently reviewed. See above   Assessment/Plan  New Onset Afib  -metoprolol 25 mg po qid  - metoprolol 2.5 iv q2min prn hr >120 consistently  -heparin drip - consider dig if blood pressure become tenuous  -await cardiology in put    Chronic CHF ref -admit to progressive care  - per cardiology no need for diuresis as patient is comensated -echo in AM - f/u with cardiology consult rec  Abnormal  CE -in setting of recent ICD placement / as well as Afib rvr  - current CE trending down, due to muscle injury from procedure as well as demand from Afib RVR     HLD -continue on statin   Hypothyroidism  -resume synthroid  -check tsh   DVT prophylaxis: heparin Code Status: full/ as discussed per patient wishes in event of cardiac arrest  Family Communication:  Disposition Plan: no family at bedside Consults called: Cardiology Dr Daphine Deutscher Admission status:  progressive care    Lurline Del MD [facility]   If 7PM-7AM, please contact night-coverage [email or web address] Password [facility]  12/17/2023, 10:04 PM

## 2023-12-17 NOTE — Consult Note (Signed)
 Cardiology Consultation   Patient ID: Janet Ramirez MRN: [id number]; DOB: 1960/10/16  Admit date: 12/17/2023 Date of Consult: 12/17/2023  PCP:  [facility] Providers Cardiologist:  Janet Ishikawa, MD  Electrophysiologist:  Janet Prude, MD       Patient Profile:   Janet Ramirez is a 63 y.o. female with a hx of NICM EF 30-35% in Dec 2024 who is being seen 12/17/2023 for the evaluation of new-onset AF at the request of Janet Ramirez.  History of Present Illness:   [name] diagnosis of heart failure dates back to 2021 when she presented to the hospital with chest pain. She was found to have an ejection fraction of 25%. Cath showed normal coronary arteries. She had a PVC burden of 6%. Cardiac MRI in 2022 showed EF of 35% with basal septal mid wall LGE. A ZIO monitor in 2022 showed a PVC burden of 2%. In April 2020 4 repeat ZIO monitor showed a 15% burden of PVCs. Mexiletine was started.  She saw EP 12-04-23 and primary prophylaxis ICD recommended. Pt had ICD placed on 12-14-23 w/o issue. She called in today after noticing her heart racing, and recording HR in the 150s. She was instructed to come to the ED where EKG confirmed atrial fibrillation. Device interrogated showing onset of AF today (within past 24h); this is new-onset for her (no prior h/o AF). She is not anticoagulated. ED attempted DCCV x 3, but this was not successful to restore NSR and pt remains in AF w/ HR in 130s. She is otherwise essentially asymptomatic from a cardiac standpoint.   Past Medical History:  Diagnosis Date   CHF (congestive heart failure) (HCC)    Hyperlipidemia    Hypothyroidism    PVC (premature ventricular contraction)     Past Surgical History:  Procedure Laterality Date   ABDOMINAL HYSTERECTOMY  1980s   ICD IMPLANT N/A 12/14/2023   Procedure: ICD IMPLANT;  Surgeon: Janet Prude, MD;  Location: [facility] INVASIVE CV LAB;  Service: Cardiovascular;   Laterality: N/A;   RIGHT/LEFT HEART CATH AND CORONARY ANGIOGRAPHY N/A 09/28/2020   Procedure: RIGHT/LEFT HEART CATH AND CORONARY ANGIOGRAPHY;  Surgeon: Swaziland, Janet M, MD;  Location: [facility] INVASIVE CV LAB;  Service: Cardiovascular;  Laterality: N/A;     Home Medications:  Prior to Admission medications   Medication Sig Start Date End Date Taking? Authorizing Provider  Calcium & Magnesium Carbonates (MYLANTA PO) Take 1 tablet by mouth daily as needed (indigestion).    [provider]  carvedilol (COREG) 3.125 MG tablet Take 1 tablet (3.125 mg total) by mouth 2 (two) times daily with a meal. 02/07/23   Bensimhon, Janet Buckles, MD  dapagliflozin propanediol (FARXIGA) 10 MG TABS tablet Take 10 mg by mouth daily.    [provider]  Lactobacillus (PROBIOTIC ACIDOPHILUS PO) Take 1 tablet by mouth daily.    [provider]  losartan (COZAAR) 25 MG tablet Take 1 tablet (25 mg total) by mouth daily. 04/10/23   Bensimhon, Janet Buckles, MD  mexiletine (MEXITIL) 150 MG capsule Take 1 capsule (150 mg total) by mouth 2 (two) times daily. 07/07/23   Bensimhon, Janet Buckles, MD  Multiple Vitamins-Minerals (MULTIVITAMIN WITH MINERALS) tablet Take 1 tablet by mouth daily.    [provider]  simethicone (MYLICON) 125 MG chewable tablet Chew 125 mg by mouth every 6 (six) hours as needed for flatulence.    [provider]  Inpatient Medications: Scheduled Meds:  Continuous Infusions:  PRN Meds:   Allergies:    Allergies  Allergen Reactions   Codeine Itching   Spironolactone     Changed vision, severe headaches    Sulfacetamide Sodium Diarrhea    Social History:   Social History   Socioeconomic History   Marital status: Married    Spouse name: Not on file   Number of children: Not on file   Years of education: Not on file   Highest education level: Not on file  Occupational History   Not on file  Tobacco Use   Smoking status: Never   Smokeless tobacco: Never   Substance and Sexual Activity   Alcohol use: Yes    Comment: socially   Drug use: Never   Sexual activity: Yes    Partners: Male  Other Topics Concern   Not on file  Social History Narrative   Not on file   Social Drivers of Health   Financial Resource Strain: Not on file  Food Insecurity: Not on file  Transportation Needs: Not on file  Physical Activity: Not on file  Stress: Not on file  Social Connections: Not on file  Intimate Partner Violence: Not on file    Family History:    Family History  Problem Relation Age of Onset   Hypertension Mother    Heart disease Father        s/p stent and pacemaker placed in his 16s   Diabetes Mellitus II Father      ROS:  Please see the history of present illness.   All other ROS reviewed and negative.     Physical Exam/Data:   Vitals:   12/17/23 2020 12/17/23 2025 12/17/23 2030 12/17/23 2045  BP: (!) 122/110 (!) 108/90 (!) 107/97 106/85  Pulse: (!) 106 (!) 141 (!) 144 (!) 54  Resp: (!) 23 14 20 16   Temp:   98.8 F (37.1 C)   TempSrc:   Oral   SpO2: 99% 100% 100% 100%  Weight:      Height:       No intake or output data in the 24 hours ending 12/17/23 2104    12/17/2023    6:05 PM 12/14/2023    7:59 AM 12/04/2023   11:30 AM  Last 3 Weights  Weight (lbs) 121 lb 14.6 oz 122 lb 125 lb 9.6 oz  Weight (kg) 55.3 kg 55.339 kg 56.972 kg     Body mass index is 20.93 kg/Ramirez.  General:  Well nourished, well developed, in no acute distress HEENT: normal Neck: no JVD Vascular: No carotid bruits; Distal pulses 2+ bilaterally Cardiac:  normal S1, S2; irreg irreg; no murmur  Lungs:  clear to auscultation bilaterally, no wheezing, rhonchi or rales  Abd: soft, nontender, no hepatomegaly  Ext: no edema Musculoskeletal:  No deformities, BUE and BLE strength normal and equal Skin: warm and dry  Neuro:  CNs 2-12 intact, no focal abnormalities noted Psych:  Normal affect   EKG:  The EKG was personally reviewed and demonstrates:  AF  with HR 146, PVC Telemetry:  Telemetry was personally reviewed and demonstrates:  AF  Relevant CV Studies: TTE 09-25-23 1. Left ventricular ejection fraction, by estimation, is 30 to 35%. Left  ventricular ejection fraction by 3D volume is 33 %. The left ventricle has  moderately decreased function. The left ventricle demonstrates global  hypokinesis. The left ventricular  internal cavity size was moderately dilated. Left ventricular diastolic  parameters are indeterminate.  2. Right ventricular systolic function is normal. The right ventricular  size is normal. There is mildly elevated pulmonary artery systolic  pressure. The estimated right ventricular systolic pressure is 36.9 mmHg.   3. Left atrial size was severely dilated.   4. Right atrial size was mildly dilated.   5. The mitral valve is abnormal. Mild to moderate mitral valve  regurgitation. No evidence of mitral stenosis.   6. The aortic valve is normal in structure. Aortic valve regurgitation is  mild. No aortic stenosis is present.   7. The inferior vena cava is normal in size with <50% respiratory  variability, suggesting right atrial pressure of 8 mmHg.   09-28-20 LHC  There is severe left ventricular systolic dysfunction. LV end diastolic pressure is normal. The left ventricular ejection fraction is 25-35% by visual estimate.   1. Normal coronary anatomy 2. Severe LV dysfunction. EF 25-30% with global HK 3. Low/normal LV filling pressures 4. Normal right heart pressures 5. Preserved cardiac output with index 3.56.   Plan: medical management.  Laboratory Data:  High Sensitivity Troponin:   Recent Labs  Lab 12/17/23 1843  TROPONINIHS 2,462*     Chemistry Recent Labs  Lab 12/17/23 1843  NA 137  K 4.0  CL 103  CO2 22  GLUCOSE 107*  BUN 16  CREATININE 0.87  CALCIUM 9.4  MG 2.2  GFRNONAA >60  ANIONGAP 12    No results for input(s): "PROT", "ALBUMIN", "AST", "ALT", "ALKPHOS", "BILITOT" in the last  168 hours. Lipids No results for input(s): "CHOL", "TRIG", "HDL", "LABVLDL", "LDLCALC", "CHOLHDL" in the last 168 hours.  Hematology Recent Labs  Lab 12/17/23 1843  WBC 10.1  RBC 4.06  HGB 13.0  HCT 38.7  MCV 95.3  MCH 32.0  MCHC 33.6  RDW 12.8  PLT 240   Thyroid No results for input(s): "TSH", "FREET4" in the last 168 hours.  BNPNo results for input(s): "BNP", "PROBNP" in the last 168 hours.  DDimer No results for input(s): "DDIMER" in the last 168 hours.   Radiology/Studies:  DG Chest Portable 1 View Result Date: 12/17/2023 CLINICAL DATA:  Short of breath, left upper back pain, tachycardia EXAM: PORTABLE CHEST 1 VIEW COMPARISON:  12/14/2023 FINDINGS: Single frontal view of the chest demonstrates stable dual lead pacer/AICD. The cardiac silhouette is unchanged. Interval development of bibasilar consolidation and small bilateral pleural effusions. No pneumothorax. No acute bony abnormalities. IMPRESSION: 1. Interval development of small bilateral pleural effusions and bibasilar consolidation. 2. Stable enlarged cardiac silhouette. Electronically Signed   By: Sharlet Salina Ramirez.D.   On: 12/17/2023 19:05   DG Chest 2 View Result Date: 12/14/2023 CLINICAL DATA:  Defibrillator placement. EXAM: CHEST - 2 VIEW COMPARISON:  December 02, 2020. FINDINGS: Stable cardiomediastinal silhouette. Interval placement of left-sided defibrillator with leads in grossly good position. No pneumothorax. Both lungs are clear. The visualized skeletal structures are unremarkable. IMPRESSION: Interval placement of left-sided defibrillator. Electronically Signed   By: Lupita Raider Ramirez.D.   On: 12/14/2023 14:35   EP PPM/ICD IMPLANT Result Date: 12/14/2023  CONCLUSIONS:  1. Chronic systolic heart failure  2. Successful ICD implantation with a [employer] DDD ICD implanted for primary prevention of sudden death.  3.  No early apparent complications.     Assessment and Plan:   AF: new-onset. Failed attempted DCCV in ED  x 3 attempts, remains in AF with HR 130s. Pt on coreg at home; would change over to metoprolol 25mg  q6h for now for better rate control.  She has h/o frequent PVC and is already on AAT w/ mexiletine. would avoid amiodarone for now until seen by EP. Would begin anticoagulation w/ heparin IV gtt. Will have EP see pt in AM for further recommendations.  2. NICM: follows w/ advanced HF clinic. Currently compensated clinically. Recommend continue GDMT w/ exception of coreg--sub metoprolol IR for now for acute rate control.  3. HLD/thyroid d/o and other non-cardiac problems: cont home regimen  Risk Assessment/Risk Scores:        New York Heart Association (NYHA) Functional Class NYHA Class I  CHA2DS2-VASc Score = 2 (female, HF)    This indicates a  % annual risk of stroke. The patient's score is based upon:           For questions or updates, please contact [facility] Please consult [email or web address] for contact info under    Signed, Precious Reel, MD, [facility]  12/17/2023 9:04 PM

## 2023-12-17 NOTE — ED Notes (Signed)
 Pacemaker interrogated. Data sent message

## 2023-12-18 ENCOUNTER — Inpatient Hospital Stay (HOSPITAL_COMMUNITY)

## 2023-12-18 ENCOUNTER — Other Ambulatory Visit: Payer: Self-pay

## 2023-12-18 DIAGNOSIS — I4891 Unspecified atrial fibrillation: Secondary | ICD-10-CM | POA: Diagnosis not present

## 2023-12-18 LAB — COMPREHENSIVE METABOLIC PANEL
ALT: 91 U/L — ABNORMAL HIGH (ref 0–44)
AST: 80 U/L — ABNORMAL HIGH (ref 15–41)
Albumin: 3.8 g/dL (ref 3.5–5.0)
Alkaline Phosphatase: 68 U/L (ref 38–126)
Anion gap: 12 (ref 5–15)
BUN: 10 mg/dL (ref 8–23)
CO2: 22 mmol/L (ref 22–32)
Calcium: 9.1 mg/dL (ref 8.9–10.3)
Chloride: 105 mmol/L (ref 98–111)
Creatinine, Ser: 0.78 mg/dL (ref 0.44–1.00)
GFR, Estimated: >60 mL/min (ref >60)
Glucose, Bld: 99 mg/dL (ref 70–99)
Potassium: 4 mmol/L (ref 3.5–5.1)
Sodium: 139 mmol/L (ref 135–145)
Total Bilirubin: 1.2 mg/dL (ref 0.0–1.2)
Total Protein: 6.9 g/dL (ref 6.5–8.1)

## 2023-12-18 LAB — CBC
HCT: 41.4 % (ref 36.0–46.0)
Hemoglobin: 13.7 g/dL (ref 12.0–15.0)
MCH: 32 pg (ref 26.0–34.0)
MCHC: 33.1 g/dL (ref 30.0–36.0)
MCV: 96.7 fL (ref 80.0–100.0)
Platelets: 244 10*3/uL (ref 150–400)
RBC: 4.28 MIL/uL (ref 3.87–5.11)
RDW: 12.9 % (ref 11.5–15.5)
WBC: 11.3 10*3/uL — ABNORMAL HIGH (ref 4.0–10.5)
nRBC: 0 % (ref 0.0–0.2)

## 2023-12-18 LAB — HEPARIN LEVEL (UNFRACTIONATED): Heparin Unfractionated: 0.52 [IU]/mL (ref 0.30–0.70)

## 2023-12-18 LAB — PROTIME-INR
INR: 1 (ref 0.8–1.2)
Prothrombin Time: 13.8 s (ref 11.4–15.2)

## 2023-12-18 LAB — HIV ANTIBODY (ROUTINE TESTING W REFLEX): HIV Screen 4th Generation wRfx: NONREACTIVE

## 2023-12-18 MED ORDER — AMIODARONE HCL IN DEXTROSE 360-4.14 MG/200ML-% IV SOLN
60.0000 mg/h | INTRAVENOUS | Status: AC
  Administered 2023-12-18 (×2): 60 mg/h via INTRAVENOUS
  Filled 2023-12-18: qty 200

## 2023-12-18 MED ORDER — METOPROLOL TARTRATE 25 MG PO TABS: 25.0000 mg | ORAL_TABLET | Freq: Four times a day (QID) | ORAL | Status: DC

## 2023-12-18 MED ORDER — SIMETHICONE 80 MG PO CHEW
80.0000 mg | CHEWABLE_TABLET | Freq: Every day | ORAL | Status: DC
  Administered 2023-12-18 – 2023-12-19 (×2): 80 mg via ORAL
  Filled 2023-12-18 (×2): qty 1

## 2023-12-18 MED ORDER — CARVEDILOL 6.25 MG PO TABS
6.2500 mg | ORAL_TABLET | Freq: Two times a day (BID) | ORAL | Status: DC
  Administered 2023-12-18: 6.25 mg via ORAL
  Filled 2023-12-18: qty 2

## 2023-12-18 MED ORDER — METOPROLOL TARTRATE 5 MG/5ML IV SOLN: 5.0000 mg | Freq: Four times a day (QID) | INTRAVENOUS | Status: DC | PRN

## 2023-12-18 MED ORDER — ALUM & MAG HYDROXIDE-SIMETH 200-200-20 MG/5ML PO SUSP
30.0000 mL | ORAL | Status: DC | PRN
  Administered 2023-12-18 (×2): 30 mL via ORAL
  Filled 2023-12-18 (×2): qty 30

## 2023-12-18 MED ORDER — METOPROLOL TARTRATE 25 MG PO TABS
25.0000 mg | ORAL_TABLET | Freq: Once | ORAL | Status: AC
Start: 2023-12-18 — End: 2023-12-18
  Administered 2023-12-18: 25 mg via ORAL
  Filled 2023-12-18: qty 1

## 2023-12-18 MED ORDER — ACETAMINOPHEN 325 MG PO TABS
650.0000 mg | ORAL_TABLET | Freq: Four times a day (QID) | ORAL | Status: DC | PRN
Start: 2023-12-18 — End: 2023-12-19
  Administered 2023-12-18: 650 mg via ORAL
  Filled 2023-12-18 (×2): qty 2

## 2023-12-18 MED ORDER — AMIODARONE HCL IN DEXTROSE 360-4.14 MG/200ML-% IV SOLN
30.0000 mg/h | INTRAVENOUS | Status: AC
  Administered 2023-12-18: 30 mg/h via INTRAVENOUS
  Filled 2023-12-18 (×3): qty 200

## 2023-12-18 MED ORDER — IOHEXOL 350 MG/ML SOLN
75.0000 mL | Freq: Once | INTRAVENOUS | Status: AC | PRN
  Administered 2023-12-18: 75 mL via INTRAVENOUS

## 2023-12-18 MED ORDER — APIXABAN 5 MG PO TABS
5.0000 mg | ORAL_TABLET | Freq: Two times a day (BID) | ORAL | Status: DC
  Administered 2023-12-18 – 2023-12-19 (×3): 5 mg via ORAL
  Filled 2023-12-18 (×3): qty 1

## 2023-12-18 MED ORDER — AMIODARONE LOAD VIA INFUSION
150.0000 mg | Freq: Once | INTRAVENOUS | Status: AC
  Administered 2023-12-18: 150 mg via INTRAVENOUS
  Filled 2023-12-18: qty 83.34

## 2023-12-18 NOTE — Consult Note (Signed)
 CARDIOLOGY CONSULT NOTE       Patient ID: Janet Ramirez MRN: [id number] DOB/AGE: Jan 23, 1961 63 y.o.  Admit date: 12/17/2023 Referring Physician: Skip Mayer Primary Physician: [facility] Primary Cardiologist: Bensimohn/Lambert Reason for Consultation: AFib  Principal Problem:   Atrial fibrillation with RVR (HCC)   HPI:  63 y.o. with history of hypothyroidism, HLD, CHF and PVC. She see Dr Lalla Brothers She has had an ablation for PVC;s. 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 Subsequently had ICD implant 12/14/23 with Medtronic device. Admitted with sudden onset 24 hours PAF. Interestingly monitor done 11/19/23 showed no PaF. She has not had dyspnea, chest pain or syncope. ECG with normal QT and rapid afib. On coreg for GDMT at home  TTE 09/25/23 with EF 30-35% and severe LAE. TSH/K/Mg normal  She was on mexiletine at home presumably to further suppress PVC;s  CTA negative for PE with small bilateral Effusions   ROS All other systems reviewed and negative except as noted above  Past Medical History:  Diagnosis Date   CHF (congestive heart failure) (HCC)    Hyperlipidemia    Hypothyroidism    PVC (premature ventricular contraction)     Family History  Problem Relation Age of Onset   Hypertension Mother    Heart disease Father        s/p stent and pacemaker placed in his 51s   Diabetes Mellitus II Father     Social History   Socioeconomic History   Marital status: Married    Spouse name: Not on file   Number of children: Not on file   Years of education: Not on file   Highest education level: Not on file  Occupational History   Not on file  Tobacco Use   Smoking status: Never   Smokeless tobacco: Never  Substance and Sexual Activity   Alcohol use: Yes    Comment: socially   Drug use: Never   Sexual activity: Yes    Partners: Male  Other Topics Concern   Not on file  Social History Narrative   Not on file   Social Drivers  of Health   Financial Resource Strain: Not on file  Food Insecurity: Not on file  Transportation Needs: Not on file  Physical Activity: Not on file  Stress: Not on file  Social Connections: Not on file  Intimate Partner Violence: Not on file    Past Surgical History:  Procedure Laterality Date   ABDOMINAL HYSTERECTOMY  1980s   ICD IMPLANT N/A 12/14/2023   Procedure: ICD IMPLANT;  Surgeon: Lanier Prude, MD;  Location: [facility] INVASIVE CV LAB;  Service: Cardiovascular;  Laterality: N/A;   RIGHT/LEFT HEART CATH AND CORONARY ANGIOGRAPHY N/A 09/28/2020   Procedure: RIGHT/LEFT HEART CATH AND CORONARY ANGIOGRAPHY;  Surgeon: [place], Abeer Iversen M, MD;  Location: [facility] INVASIVE CV LAB;  Service: Cardiovascular;  Laterality: N/A;      Current Facility-Administered Medications:    acetaminophen (TYLENOL) tablet 650 mg, 650 mg, Oral, Q6H PRN, Lurline Del, MD, 650 mg at 12/18/23 0248   alum & mag hydroxide-simeth (MAALOX/MYLANTA) 200-200-20 MG/5ML suspension 30 mL, 30 mL, Oral, Q4H PRN, Lurline Del, MD, 30 mL at 12/18/23 0544   amiodarone (NEXTERONE) 1.8 mg/mL load via infusion 150 mg, 150 mg, Intravenous, Once **FOLLOWED BY** amiodarone (NEXTERONE PREMIX) 360-4.14 MG/200ML-% (1.8 mg/mL) IV infusion, 60 mg/hr, Intravenous, Continuous **FOLLOWED BY** amiodarone (NEXTERONE PREMIX) 360-4.14 MG/200ML-% (1.8 mg/mL) IV infusion, 30 mg/hr,  Intravenous, Continuous, Graciella Freer, PA-C   heparin ADULT infusion 100 units/mL (25000 units/214mL), 800 Units/hr, Intravenous, Continuous, Lurline Del, MD, Last Rate: 8 mL/hr at 12/18/23 0836, 800 Units/hr at 12/18/23 0836   metoprolol tartrate (LOPRESSOR) injection 5 mg, 5 mg, Intravenous, Q6H PRN, Mdala-Gausi, Gwenette Greet, MD   metoprolol tartrate (LOPRESSOR) tablet 25 mg, 25 mg, Oral, Q6H, Mdala-Gausi, Masiku Agatha, MD   ondansetron (ZOFRAN) tablet 4 mg, 4 mg, Oral, Q6H PRN **OR** ondansetron (ZOFRAN) injection 4 mg, 4 mg, Intravenous, Q6H  PRN, Lurline Del, MD   simethicone (MYLICON) chewable tablet 80 mg, 80 mg, Oral, Daily, Graciella Freer, PA-C  Current Outpatient Medications:    Calcium & Magnesium Carbonates (MYLANTA PO), Take 1 tablet by mouth daily as needed (indigestion)., Disp: , Rfl:    carvedilol (COREG) 3.125 MG tablet, Take 1 tablet (3.125 mg total) by mouth 2 (two) times daily with a meal., Disp: 180 tablet, Rfl: 3   dapagliflozin propanediol (FARXIGA) 10 MG TABS tablet, Take 5 mg by mouth in the morning and at bedtime., Disp: , Rfl:    Lactobacillus (PROBIOTIC ACIDOPHILUS PO), Take 1 tablet by mouth daily., Disp: , Rfl:    losartan (COZAAR) 25 MG tablet, Take 1 tablet (25 mg total) by mouth daily., Disp: 90 tablet, Rfl: 3   mexiletine (MEXITIL) 150 MG capsule, Take 1 capsule (150 mg total) by mouth 2 (two) times daily., Disp: 180 capsule, Rfl: 3   Multiple Vitamins-Minerals (MULTIVITAMIN WITH MINERALS) tablet, Take 1 tablet by mouth daily., Disp: , Rfl:    simethicone (MYLICON) 125 MG chewable tablet, Chew 125 mg by mouth every 6 (six) hours as needed for flatulence., Disp: , Rfl:   amiodarone  150 mg Intravenous Once   metoprolol tartrate  25 mg Oral Q6H   simethicone  80 mg Oral Daily    amiodarone     Followed by   amiodarone     heparin 800 Units/hr (12/18/23 0836)    Physical Exam: Blood pressure (!) 103/91, pulse (!) 148, temperature 98.9 F (37.2 C), temperature source Oral, resp. rate 16, height 5\' 4"  (1.626 m), weight 55.3 kg, SpO2 99%.   Affect appropriate Healthy:  appears stated age HEENT: normal Neck supple with no adenopathy JVP normal no bruits no thyromegaly Lungs clear with no wheezing and good diaphragmatic motion Heart:  S1/S2 no murmur, no rub, gallop or click PMI enlarged AICD under left clavicle  Abdomen: benighn, BS positve, no tenderness, no AAA no bruit.  No HSM or HJR Distal pulses intact with no bruits No edema Neuro non-focal Skin warm and dry No  muscular weakness   Labs:   Lab Results  Component Value Date   WBC 11.3 (H) 12/18/2023   HGB 13.7 12/18/2023   HCT 41.4 12/18/2023   MCV 96.7 12/18/2023   PLT 244 12/18/2023    Recent Labs  Lab 12/18/23 0230  NA 139  K 4.0  CL 105  CO2 22  BUN 10  CREATININE 0.78  CALCIUM 9.1  PROT 6.9  BILITOT 1.2  ALKPHOS 68  ALT 91*  AST 80*  GLUCOSE 99   No results found for: "CKTOTAL", "CKMB", "CKMBINDEX", "TROPONINI"  Lab Results  Component Value Date   CHOL 189 09/27/2020   Lab Results  Component Value Date   HDL 76 09/27/2020   Lab Results  Component Value Date   LDLCALC 104 (H) 09/27/2020   Lab Results  Component Value Date   TRIG 45 09/27/2020  Lab Results  Component Value Date   CHOLHDL 2.5 09/27/2020   No results found for: "LDLDIRECT"    Radiology: CT Angio Chest Pulmonary Embolism (PE) W or WO Contrast Result Date: 12/18/2023 CLINICAL DATA:  Pulmonary embolism suspected EXAM: CT ANGIOGRAPHY CHEST WITH CONTRAST TECHNIQUE: Multidetector CT imaging of the chest was performed using the standard protocol during bolus administration of intravenous contrast. Multiplanar CT image reconstructions and MIPs were obtained to evaluate the vascular anatomy. RADIATION DOSE REDUCTION: This exam was performed according to the departmental dose-optimization program which includes automated exposure control, adjustment of the mA and/or kV according to patient size and/or use of iterative reconstruction technique. CONTRAST:  75mL OMNIPAQUE IOHEXOL 350 MG/ML SOLN COMPARISON:  None Available. FINDINGS: Cardiovascular: Satisfactory opacification of the pulmonary arteries to the segmental level. No evidence of pulmonary embolism. Enlarged heart size. No pericardial effusion. ICD/pacer leads, dual-chamber from the left. Mediastinum/Nodes: No mass or adenopathy. Lungs/Pleura: Small pleural effusions with lower lobe atelectasis. 3 mm average diameter pulmonary nodule in the right anterior  lung is stable from prior PET. No suspicious nodule. No consolidation or interstitial edema. The central airways are clear. Upper Abdomen: No acute finding Musculoskeletal: No acute finding Review of the MIP images confirms the above findings. IMPRESSION: 1. Negative for pulmonary embolism. 2. Cardiomegaly with small pleural effusions and lower lobe atelectasis. Electronically Signed   By: Tiburcio Pea M.D.   On: 12/18/2023 04:41   DG Chest Portable 1 View Result Date: 12/17/2023 CLINICAL DATA:  Short of breath, left upper back pain, tachycardia EXAM: PORTABLE CHEST 1 VIEW COMPARISON:  12/14/2023 FINDINGS: Single frontal view of the chest demonstrates stable dual lead pacer/AICD. The cardiac silhouette is unchanged. Interval development of bibasilar consolidation and small bilateral pleural effusions. No pneumothorax. No acute bony abnormalities. IMPRESSION: 1. Interval development of small bilateral pleural effusions and bibasilar consolidation. 2. Stable enlarged cardiac silhouette. Electronically Signed   By: Sharlet Salina M.D.   On: 12/17/2023 19:05   DG Chest 2 View Result Date: 12/14/2023 CLINICAL DATA:  Defibrillator placement. EXAM: CHEST - 2 VIEW COMPARISON:  December 02, 2020. FINDINGS: Stable cardiomediastinal silhouette. Interval placement of left-sided defibrillator with leads in grossly good position. No pneumothorax. Both lungs are clear. The visualized skeletal structures are unremarkable. IMPRESSION: Interval placement of left-sided defibrillator. Electronically Signed   By: Lupita Raider M.D.   On: 12/14/2023 14:35   EP PPM/ICD IMPLANT Result Date: 12/14/2023  CONCLUSIONS:  1. Chronic systolic heart failure  2. Successful ICD implantation with a Medtronic DDD ICD implanted for primary prevention of sudden death.  3.  No early apparent complications.    EKG: afib normal QT   ASSESSMENT AND PLAN:   PAF:  would d/c Mexiletine and start Tikosyn per EP Increase coreg for rate control  and consider adding digoxin as BP is soft. Start eliquis  CHF:  chronic non ischemic DCM. GDMT limited by low BP. Was on cozaar at home Holding to optimize rate control with coreg. She has allergy to aldactone with severe headaches. Low dose lasix given pleural effuisons  AICD:  Medtronic interrogate per EP but per patient onset of PAF 24 hours ago   Signed: Charlton Haws 12/18/2023, 8:44 AM

## 2023-12-18 NOTE — Progress Notes (Signed)
 Progress Note   Patient: Janet Ramirez WGN:[id number] DOB: 04-29-1961 DOA: 12/17/2023     1 DOS: the patient was seen and examined on 12/18/2023   Brief hospital course: 63 year old woman with PMH of chronic systolic heart failure EF 25% with PVCs , HLD, hypothyroidism with recent interim history of ICD placement 12/14/2023  due to high burden pvc  on last zio who presented with acute onset of palpitations with hr in 140-150's, without associated symptoms of presyncope/chest pain or sob.  ON her Kardia device she was able to confirm her HR of 150. Patient called cardiology office and was referred to  ED. found to be in A-fib with RVR. Cardiology consulted.  EP also consulted.  Assessment and Plan:  New Onset Afib, presenting in RVR Initially started on metoprolol. Patient has been seen by EP and cardiology. CHA2DS2-VASc score of at least 2. Now on an amiodarone drip. Patient has been started on Eliquis. -Continue amiodarone infusion. -N.p.o. after midnight in case patient needs cardioversion.    Chronic HFrEF s/p ICD -admit to progressive care  - per cardiology no need for diuresis as patient is comensated - f/u with cardiology consult rec   Elevated troponins Likely due to tachycardia. -Cardiology following.   HLD -continue on statin    Hypothyroidism  TSH 2.47. -resumed synthroid     Subjective: Patient states she feels well.  She is having palpitations occasionally.  She denies shortness of breath, chest pain  Physical Exam: Vitals:   12/18/23 1227 12/18/23 1352 12/18/23 1547 12/18/23 1600  BP: (!) 118/52  106/78 104/74  Pulse: (!) 127  80 72  Resp: (!) 22  18 18   Temp:  98.3 F (36.8 C) 99.5 F (37.5 C)   TempSrc:  Oral Oral   SpO2: 98%  97% 94%  Weight:      Height:        General: Alert, oriented X3  Eyes: Pupils equal, reactive  Oral cavity: moist mucous membranes  Head: Atraumatic, normocephalic  Neck: supple  Chest: clear to auscultation. No crackles, no  wheezes  CVS: S1,S2 RRR. No murmurs.  Tachycardia. Abd: No distention, soft, non-tender. No masses palpable  Extr: No edema   MSK: No joint deformities or swelling  Neurological: Grossly intact.     Data Reviewed:  CBC    Component Value Date/Time   WBC 11.3 (H) 12/18/2023 0230   RBC 4.28 12/18/2023 0230   HGB 13.7 12/18/2023 0230   HGB 14.2 12/04/2023 1338   HCT 41.4 12/18/2023 0230   HCT 42.8 12/04/2023 1338   PLT 244 12/18/2023 0230   PLT 240 12/04/2023 1338   MCV 96.7 12/18/2023 0230   MCV 96 12/04/2023 1338   MCH 32.0 12/18/2023 0230   MCHC 33.1 12/18/2023 0230   RDW 12.9 12/18/2023 0230   RDW 12.0 12/04/2023 1338   LYMPHSABS 1.4 12/04/2023 1338   EOSABS 0.1 12/04/2023 1338   BASOSABS 0.0 12/04/2023 1338      Latest Ref Rng & Units 12/18/2023    2:30 AM 12/17/2023    6:43 PM 12/04/2023    1:38 PM  BMP  Glucose 70 - 99 mg/dL 99  784  696   BUN 8 - 23 mg/dL 10  16  12    Creatinine 0.44 - 1.00 mg/dL 2.95  2.84  1.32   BUN/Creat Ratio 12 - 28   13   Sodium 135 - 145 mmol/L 139  137  143   Potassium 3.5 - 5.1 mmol/L 4.0  4.0  4.1   Chloride 98 - 111 mmol/L 105  103  104   CO2 22 - 32 mmol/L 22  22  24    Calcium 8.9 - 10.3 mg/dL 9.1  9.4  9.6      Family Communication: Husband at bedside  Disposition: Status is: Inpatient Remains inpatient appropriate because: On IV amiodarone for A-fib with RVR  Planned Discharge Destination: Home DVT ppx: Systemic anticoagulation with apixaban.   Time spent: 45 minutes  Author: Marcine Matar, MD 12/18/2023 5:34 PM  For on call review [email or web address].

## 2023-12-18 NOTE — Progress Notes (Signed)
 PHARMACY - ANTICOAGULATION CONSULT NOTE  Pharmacy Consult for Apixaban Indication: atrial fibrillation  Allergies  Allergen Reactions   Codeine Itching   Spironolactone     Changed vision, severe headaches    Sulfacetamide Sodium Diarrhea    Patient Measurements: Height: 5\' 4"  (162.6 cm) Weight: 55.3 kg (121 lb 14.6 oz) IBW/kg (Calculated) : 54.7 Heparin Dosing Weight: 55.3 kg  Vital Signs: Temp: 98.9 F (37.2 C) (03/17 0515) Temp Source: Oral (03/17 0515) BP: 103/91 (03/17 0630) Pulse Rate: 148 (03/17 0630)  Labs: Recent Labs    12/17/23 1843 12/17/23 2009 12/18/23 0230  HGB 13.0  --  13.7  HCT 38.7  --  41.4  PLT 240  --  244  LABPROT  --   --  13.8  INR  --   --  1.0  HEPARINUNFRC  --   --  0.52  CREATININE 0.87  --  0.78  TROPONINIHS 2,462* 2,267*  --     Estimated Creatinine Clearance: 62.2 mL/min (by C-G formula based on SCr of 0.78 mg/dL).   Medical History: Past Medical History:  Diagnosis Date   CHF (congestive heart failure) (HCC)    Hyperlipidemia    Hypothyroidism    PVC (premature ventricular contraction)     Assessment: Janet Ramirez is a 63 y.o. year old female admitted on 12/17/2023 with concern for afib. No anticoagulation prior to admission. Pharmacy consulted to transition heparin to Eliquis     Plan:  D/C Heparin  Start Eliquis 5mg  BID   Thank you for allowing pharmacy to participate in this patient's care.  Ruben Im, PharmD Clinical Pharmacist 12/18/2023 8:55 AM Please check AMION for all [facility] Pharmacy numbers

## 2023-12-18 NOTE — ED Notes (Signed)
 EP providers at bedside

## 2023-12-18 NOTE — ED Notes (Signed)
 Patient transported to CT via stretcher.

## 2023-12-18 NOTE — Progress Notes (Addendum)
 PHARMACY - ANTICOAGULATION CONSULT NOTE  Pharmacy Consult for IV heparin Indication: atrial fibrillation  Allergies  Allergen Reactions   Codeine Itching   Spironolactone     Changed vision, severe headaches    Sulfacetamide Sodium Diarrhea    Patient Measurements: Height: 5\' 4"  (162.6 cm) Weight: 55.3 kg (121 lb 14.6 oz) IBW/kg (Calculated) : 54.7 Heparin Dosing Weight: 55.3 kg  Vital Signs: Temp: 98.9 F (37.2 C) (03/17 0515) Temp Source: Oral (03/17 0515) BP: 106/90 (03/17 0515) Pulse Rate: 88 (03/17 0515)  Labs: Recent Labs    12/17/23 1843 12/17/23 2009 12/18/23 0230  HGB 13.0  --  13.7  HCT 38.7  --  41.4  PLT 240  --  244  LABPROT  --   --  13.8  INR  --   --  1.0  HEPARINUNFRC  --   --  0.52  CREATININE 0.87  --  0.78  TROPONINIHS 2,462* 2,267*  --     Estimated Creatinine Clearance: 62.2 mL/min (by C-G formula based on SCr of 0.78 mg/dL).   Medical History: Past Medical History:  Diagnosis Date   CHF (congestive heart failure) (HCC)    Hyperlipidemia    Hypothyroidism    PVC (premature ventricular contraction)     Assessment: Janet Ramirez is a 63 y.o. year old female admitted on 12/17/2023 with concern for afib. No anticoagulation prior to admission. Pharmacy consulted to dose heparin.  Heparin level 0.52, therapeutic  No issues with infusion or bleeding noted.   Goal of Therapy:  Heparin level 0.3-0.7 units/ml Monitor platelets by anticoagulation protocol: Yes   Plan:  Continue heparin infusion at 800 units/hr 8 heparin level  Daily heparin level, CBC, and monitoring for bleeding F/u plans for anticoagulation and cards recs  Thank you for allowing pharmacy to participate in this patient's care.  Marja Kays, PharmD Emergency Medicine Clinical Pharmacist 12/18/2023,7:58 AM

## 2023-12-18 NOTE — Consult Note (Signed)
 ELECTROPHYSIOLOGY CONSULT NOTE    Patient ID: Janet Ramirez MRN: [id number], DOB/AGE: 10-30-1960 63 y.o.  Admit date: 12/17/2023 Date of Consult: 12/18/2023  Primary Physician: [facility] Primary Cardiologist: None  Electrophysiologist: Dr. Lalla Brothers   Referring Provider: . Dr. Eden Emms   Patient Profile: Janet Ramirez is a 63 y.o. female with a history of PVCs and chronic systolic CHF s/p ICD who is being seen today for the evaluation of AF with RVR at the request of Dr. Eden Emms.  HPI:  Janet Ramirez is a 63 y.o. female with medical history as above. Pt recently underwent ICD 12/14/2023  Presented to [facility] yesterday with new onset tachycardia. Consistent with AF RVR by device and EKG. Confirmed by device started yesterday evening 3/16. Denies dyspnea, chest pain, or syncope.  Admitted for EP consultation given recent device and new AF.  Cardioversion attempted in ED unsuccessful x 3.   Pt feeling OK at rest this am. Started on heparin overnight, but AAD deferred for EP conversation. Felt poor candidate for Tikosyn given acute nature of issues and CHF.  Seen with Dr. Lalla Brothers. Will plan amiodarone initiation for short term therapy, titration of AV nodal agents, and transition to eliquis.   CXR with stable lead appearance.  Pt did have complaints of shoulder and epigastric pain. She clarifies that this is chronic and relieved by anti-acids and burping.  Large gas bubble noted on CXR also. Not felt to represent pericarditic symptoms.   Labs Potassium4.0 (03/17 0230) Magnesium  2.1 (03/16 2231) Creatinine, ser  0.78 (03/17 0230) PLT  244 (03/17 0230) HGB  13.7 (03/17 0230) WBC 11.3* (03/17 0230) Troponin I (High Sensitivity)2,267* (03/16 2009).    Past Medical History:  Diagnosis Date   CHF (congestive heart failure) (HCC)    Hyperlipidemia    Hypothyroidism    PVC (premature ventricular contraction)      Surgical History:  Past Surgical History:  Procedure Laterality  Date   ABDOMINAL HYSTERECTOMY  1980s   ICD IMPLANT N/A 12/14/2023   Procedure: ICD IMPLANT;  Surgeon: Lanier Prude, MD;  Location: [facility] INVASIVE CV LAB;  Service: Cardiovascular;  Laterality: N/A;   RIGHT/LEFT HEART CATH AND CORONARY ANGIOGRAPHY N/A 09/28/2020   Procedure: RIGHT/LEFT HEART CATH AND CORONARY ANGIOGRAPHY;  Surgeon: Swazil[place], Peter M, MD;  Location: [facility] INVASIVE CV LAB;  Service: Cardiovascular;  Laterality: N/A;     (Not in a hospital admission)   Inpatient Medications:   amiodarone  150 mg Intravenous Once   metoprolol tartrate  25 mg Oral Q6H    Allergies:  Allergies  Allergen Reactions   Codeine Itching   Spironolactone     Changed vision, severe headaches    Sulfacetamide Sodium Diarrhea    Family History  Problem Relation Age of Onset   Hypertension Mother    Heart disease Father        s/p stent and pacemaker placed in his 45s   Diabetes Mellitus II Father      Physical Exam: Vitals:   12/18/23 0400 12/18/23 0401 12/18/23 0515 12/18/23 0630  BP: (!) 125/108  (!) 106/90 (!) 103/91  Pulse: (!) 142 (!) 154 88 (!) 148  Resp: 17 13 20 16   Temp:   98.9 F (37.2 C)   TempSrc:   Oral   SpO2: 97% 97% 97% 99%  Weight:      Height:        GEN- NAD, A&O x 3, normal affect HEENT: Normocephalic, atraumatic Lungs-  CTAB, Normal effort.  Heart- Tachy, Irregular rate and rhythm, No M/G/R.  GI- Soft, NT, ND.  Extremities- No clubbing, cyanosis, or edema   Radiology/Studies: CT Angio Chest Pulmonary Embolism (PE) W or WO Contrast Result Date: 12/18/2023 CLINICAL DATA:  Pulmonary embolism suspected EXAM: CT ANGIOGRAPHY CHEST WITH CONTRAST TECHNIQUE: Multidetector CT imaging of the chest was performed using the standard protocol during bolus administration of intravenous contrast. Multiplanar CT image reconstructions and MIPs were obtained to evaluate the vascular anatomy. RADIATION DOSE REDUCTION: This exam was performed according to the departmental  dose-optimization program which includes automated exposure control, adjustment of the mA and/or kV according to patient size and/or use of iterative reconstruction technique. CONTRAST:  75mL OMNIPAQUE IOHEXOL 350 MG/ML SOLN COMPARISON:  None Available. FINDINGS: Cardiovascular: Satisfactory opacification of the pulmonary arteries to the segmental level. No evidence of pulmonary embolism. Enlarged heart size. No pericardial effusion. ICD/pacer leads, dual-chamber from the left. Mediastinum/Nodes: No mass or adenopathy. Lungs/Pleura: Small pleural effusions with lower lobe atelectasis. 3 mm average diameter pulmonary nodule in the right anterior lung is stable from prior PET. No suspicious nodule. No consolidation or interstitial edema. The central airways are clear. Upper Abdomen: No acute finding Musculoskeletal: No acute finding Review of the MIP images confirms the above findings. IMPRESSION: 1. Negative for pulmonary embolism. 2. Cardiomegaly with small pleural effusions and lower lobe atelectasis. Electronically Signed   By: Tiburcio Pea M.D.   On: 12/18/2023 04:41   DG Chest Portable 1 View Result Date: 12/17/2023 CLINICAL DATA:  Short of breath, left upper back pain, tachycardia EXAM: PORTABLE CHEST 1 VIEW COMPARISON:  12/14/2023 FINDINGS: Single frontal view of the chest demonstrates stable dual lead pacer/AICD. The cardiac silhouette is unchanged. Interval development of bibasilar consolidation and small bilateral pleural effusions. No pneumothorax. No acute bony abnormalities. IMPRESSION: 1. Interval development of small bilateral pleural effusions and bibasilar consolidation. 2. Stable enlarged cardiac silhouette. Electronically Signed   By: Sharlet Salina M.D.   On: 12/17/2023 19:05   DG Chest 2 View Result Date: 12/14/2023 CLINICAL DATA:  Defibrillator placement. EXAM: CHEST - 2 VIEW COMPARISON:  December 02, 2020. FINDINGS: Stable cardiomediastinal silhouette. Interval placement of left-sided  defibrillator with leads in grossly good position. No pneumothorax. Both lungs are clear. The visualized skeletal structures are unremarkable. IMPRESSION: Interval placement of left-sided defibrillator. Electronically Signed   By: Lupita Raider M.D.   On: 12/14/2023 14:35   EP PPM/ICD IMPLANT Result Date: 12/14/2023  CONCLUSIONS:  1. Chronic systolic heart failure  2. Successful ICD implantation with a Medtronic DDD ICD implanted for primary prevention of sudden death.  3.  No early apparent complications.    EKG: on arrival showed AF at 152 bpm (personally reviewed)  TELEMETRY: AF with RVR in 140-150s (personally reviewed)  Arrhythmia/Device History S/p MDT DDD ICD 12/14/23  Assessment/Plan:  Paroxysmal AF New diagnosis. Started 3/16. CHA2DS2/VASc is at least 2. Will transition off of heparin to Eliquis.  Start amiodarone for short term, with ultimate goal for ablation.  Depending on rate control, consider another cardioversion tomorrow.   Acute on Chronic systolic CHF S/p Dual Chamber ICD Volume status looks OK.  Leads look OK on CXR.  Chest discomfort improved burping, also with maalox and tylenol.  Changed from lopressor to coreg. If BP remains soft consider switch to lopressor for more rapid rate control with consolidation to toprol on d/c   PVCs On mexitil Starting amiodarone as above.  Gas Epigastric pain Chronic issue, has historically  occurred with lying flat Prn simethicone   For questions or updates, please contact [facility] Please consult [email or web address] for contact info under Cardiology/STEMI.  Dustin Flock, PA-C  12/18/2023 8:33 AM

## 2023-12-18 NOTE — ED Notes (Signed)
 IV x2 unsuccessful by this RN.

## 2023-12-18 NOTE — ED Notes (Signed)
 Provider made aware about the pt's HR being 130-150s

## 2023-12-19 ENCOUNTER — Telehealth (HOSPITAL_COMMUNITY): Payer: Self-pay | Admitting: Pharmacy Technician

## 2023-12-19 ENCOUNTER — Other Ambulatory Visit (HOSPITAL_COMMUNITY): Payer: Self-pay

## 2023-12-19 ENCOUNTER — Encounter (HOSPITAL_COMMUNITY): Admission: EM | Payer: Self-pay | Source: Home / Self Care | Attending: Internal Medicine

## 2023-12-19 DIAGNOSIS — I4891 Unspecified atrial fibrillation: Secondary | ICD-10-CM | POA: Diagnosis not present

## 2023-12-19 LAB — MAGNESIUM: Magnesium: 2.4 mg/dL (ref 1.7–2.4)

## 2023-12-19 LAB — CBC
HCT: 33.3 % — ABNORMAL LOW (ref 36.0–46.0)
Hemoglobin: 11.2 g/dL — ABNORMAL LOW (ref 12.0–15.0)
MCH: 32.2 pg (ref 26.0–34.0)
MCHC: 33.6 g/dL (ref 30.0–36.0)
MCV: 95.7 fL (ref 80.0–100.0)
Platelets: 210 10*3/uL (ref 150–400)
RBC: 3.48 MIL/uL — ABNORMAL LOW (ref 3.87–5.11)
RDW: 12.9 % (ref 11.5–15.5)
WBC: 8.6 10*3/uL (ref 4.0–10.5)
nRBC: 0 % (ref 0.0–0.2)

## 2023-12-19 LAB — BASIC METABOLIC PANEL
Anion gap: 9 (ref 5–15)
BUN: 8 mg/dL (ref 8–23)
CO2: 25 mmol/L (ref 22–32)
Calcium: 8.6 mg/dL — ABNORMAL LOW (ref 8.9–10.3)
Chloride: 104 mmol/L (ref 98–111)
Creatinine, Ser: 0.75 mg/dL (ref 0.44–1.00)
GFR, Estimated: >60 mL/min (ref >60)
Glucose, Bld: 120 mg/dL — ABNORMAL HIGH (ref 70–99)
Potassium: 3.5 mmol/L (ref 3.5–5.1)
Sodium: 138 mmol/L (ref 135–145)

## 2023-12-19 SURGERY — CARDIOVERSION (CATH LAB)
Anesthesia: General

## 2023-12-19 MED ORDER — AMIODARONE HCL 200 MG PO TABS
200.0000 mg | ORAL_TABLET | Freq: Two times a day (BID) | ORAL | Status: DC
Start: 2023-12-19 — End: 2023-12-19
  Administered 2023-12-19: 200 mg via ORAL
  Filled 2023-12-19: qty 1

## 2023-12-19 MED ORDER — POTASSIUM CHLORIDE CRYS ER 20 MEQ PO TBCR
40.0000 meq | EXTENDED_RELEASE_TABLET | Freq: Once | ORAL | Status: AC
  Administered 2023-12-19: 40 meq via ORAL
  Filled 2023-12-19: qty 2

## 2023-12-19 MED ORDER — LOSARTAN POTASSIUM 25 MG PO TABS
12.5000 mg | ORAL_TABLET | Freq: Every day | ORAL | Status: DC
  Administered 2023-12-19: 12.5 mg via ORAL
  Filled 2023-12-19: qty 1

## 2023-12-19 MED ORDER — APIXABAN 5 MG PO TABS
5.0000 mg | ORAL_TABLET | Freq: Two times a day (BID) | ORAL | 6 refills | Status: DC
  Filled 2023-12-19: qty 60, 30d supply, fill #0

## 2023-12-19 MED ORDER — METOPROLOL SUCCINATE ER 50 MG PO TB24: 50.0000 mg | ORAL_TABLET | Freq: Every day | ORAL | Status: DC

## 2023-12-19 MED ORDER — METOPROLOL SUCCINATE ER 25 MG PO TB24
25.0000 mg | ORAL_TABLET | Freq: Every day | ORAL | 6 refills | Status: DC
  Filled 2023-12-19: qty 30, 30d supply, fill #0

## 2023-12-19 MED ORDER — AMIODARONE HCL 200 MG PO TABS
ORAL_TABLET | ORAL | 0 refills | Status: DC
  Filled 2023-12-19: qty 58, 44d supply, fill #0

## 2023-12-19 MED ORDER — LOSARTAN POTASSIUM 25 MG PO TABS
12.5000 mg | ORAL_TABLET | Freq: Every day | ORAL | 3 refills | Status: AC
  Filled 2023-12-19: qty 45, 90d supply, fill #0

## 2023-12-19 MED ORDER — METOPROLOL SUCCINATE ER 25 MG PO TB24
25.0000 mg | ORAL_TABLET | Freq: Every day | ORAL | Status: DC
  Administered 2023-12-19: 25 mg via ORAL
  Filled 2023-12-19: qty 1

## 2023-12-19 MED FILL — Midazolam HCl Inj 2 MG/2ML (Base Equivalent): INTRAMUSCULAR | Qty: 2 | Status: AC

## 2023-12-19 NOTE — TOC Transition Note (Signed)
 Transition of Care [facility]) - Discharge Note   Patient Details  Name: Janet Ramirez MRN: [id number] Date of Birth: March 04, 1961  Transition [facility]) CM/SW Contact:  Leone Haven, RN Phone Number: 12/19/2023, 10:48 AM   Clinical Narrative:    For possible dc today, spouse is at bedside, NCM informed patient of copay amt for eliquis 20.00 for refills.         Patient Goals and CMS Choice            Discharge Placement                       Discharge Plan and Services Additional resources added to the After Visit Summary for                                       Social Drivers of Health (SDOH) Interventions SDOH Screenings   Food Insecurity: No Food Insecurity (12/18/2023)  Housing: Low Risk  (12/18/2023)  Transportation Needs: No Transportation Needs (12/18/2023)  Utilities: Not At Risk (12/18/2023)  Tobacco Use: Low Risk  (12/17/2023)     Readmission Risk Interventions    12/19/2023   10:35 AM  Readmission Risk Prevention Plan  Post Dischage Appt Complete  Medication Screening Complete  Transportation Screening Complete

## 2023-12-19 NOTE — Telephone Encounter (Signed)
 Patient Product/process development scientist completed.    The patient is insured through [facility]. Patient has [employer], may use a copay card, and/or apply for patient assistance if available.    Ran test claim for Eliquis 5 mg and the current 30 day co-pay is $20.00.   This test claim was processed through [facility]- copay amounts may vary at other pharmacies due to pharmacy/plan contracts, or as the patient moves through the different stages of their insurance plan.     Roland Earl, CPHT Pharmacy Technician III Certified Patient Advocate [facility] Patient Advocate Team Direct Number: [phone]  Fax: [phone]

## 2023-12-19 NOTE — Progress Notes (Signed)
 Pt discharge instruction reviewed. Question answered. Reviewed new medication and instructions given for next dosages. Pt iv removed and pressure held. Site clean and dry. Pt  helped to get dressed. Husband sent to get the care. Pt transportation arrived via wheelchair. Pt take to discharge pharmacy to pick up medications.

## 2023-12-19 NOTE — Progress Notes (Signed)
 Mobility Specialist Progress Note:   12/19/23 1100  Mobility  Activity Ambulated independently in hallway  Level of Assistance Independent  Assistive Device None  Distance Ambulated (ft) 350 ft  Activity Response Tolerated well  Mobility Referral Yes  Mobility visit 1 Mobility  Mobility Specialist Start Time (ACUTE ONLY) 1100  Mobility Specialist Stop Time (ACUTE ONLY) 1115  Mobility Specialist Time Calculation (min) (ACUTE ONLY) 15 min   Pre Mobility: HR 70s During Mobility: HR 70-80s  Pt agreeable to mobility session. Required no physical assistance. Denies any symptoms throughout. Back in bed with all needs met, eager for d/c.  Addison Lank Mobility Specialist Please contact via SecureChat or  Rehab office at [phone]

## 2023-12-19 NOTE — TOC CM/SW Note (Addendum)
 Transition of Care [facility]) - Inpatient Brief Assessment   Patient Details  Name: Janet Ramirez MRN: [id number] Date of Birth: December 28, 1960  [facility] CM/SW Contact:    Leone Haven, RN Phone Number: 12/19/2023, 10:36 AM   Clinical Narrative: From home with spouse, has PCP, Kae Heller  and insurance on file, states has no HH services in place at this time or DME at home.  States spouse will transport them home at [employer] and family is support system, states gets medications from [facility] in [place].  Pta self ambulatory . Benefit check in progress for Eliquis.   Copay for eliquis will be 20.00.     Transition of Care Asessment: Insurance and Status: Insurance coverage has been reviewed Patient has primary care physician: Yes Home environment has been reviewed: home with spouse Prior level of function:: independent Prior/Current Home Services: No current home services Social Drivers of Health Review: SDOH reviewed no interventions necessary Readmission risk has been reviewed: Yes Transition of care needs: no transition of care needs at this time

## 2023-12-19 NOTE — Discharge Summary (Signed)
 Physician Discharge Summary   Patient: Janet Ramirez MRN: [id number] DOB: [birth year]  Admit date:     12/17/2023  Discharge date: 12/19/23  Discharge Physician: MDALA-GAUSI, Gwenette Greet   PCP: [facility]   Recommendations at discharge:   Follow-up with cardiology  Discharge Diagnoses: Principal Problem:   Atrial fibrillation with RVR (HCC)  Resolved Problems:   * No resolved hospital problems. *  Hospital Course: 63 year old woman with PMH of chronic systolic heart failure EF 25% with PVCs , HLD, hypothyroidism with recent interim history of ICD placement 12/14/2023  due to high burden pvc  on last zio who presented with acute onset of palpitations with hr in 140-150's, without associated symptoms of presyncope/chest pain or sob.  ON her Kardia device she was able to confirm her HR of 150. Patient called cardiology office and was referred to  ED. found to be in A-fib with RVR. Cardiology consulted.  EP also consulted.   Assessment and Plan:  New Onset Afib, presenting in RVR Initially started on metoprolol. Patient has been seen by EP and cardiology. CHA2DS2-VASc score of at least 2. Patient was started on amiodarone drip and Eliquis. She converted to sinus rhythm and was continued on p.o. amiodarone, Eliquis. She will follow-up with cardiology as outpatient.   Chronic HFrEF s/p ICD Not in exacerbation. Coreg was discontinued, she was started on metoprolol succinate. Patient will follow-up with cardiology as outpatient.  Elevated troponins Likely due to tachycardia. Cardiology was consulted as indicated above.   HLD Home statin was continued.   Hypothyroidism  TSH 2.47. Home Synthroid discontinued.       Consultants: Cardiology Procedures performed: n/a  Disposition: Home Diet recommendation:  Discharge Diet Orders (From admission, onward)     Start     Ordered   12/19/23 0000  Diet - low sodium heart healthy        12/19/23 1156            Cardiac diet DISCHARGE MEDICATION: Allergies as of 12/19/2023       Reactions   Codeine Itching   Spironolactone    Changed vision, severe headaches    Sulfacetamide Sodium Diarrhea        Medication List     STOP taking these medications    carvedilol 3.125 MG tablet Commonly known as: COREG   mexiletine 150 MG capsule Commonly known as: MEXITIL       TAKE these medications    amiodarone 200 MG tablet Commonly known as: PACERONE Take 1 tablet (200 mg total) by mouth 2 (two) times daily for 14 days, THEN 1 tablet (200 mg total) daily. Start taking on: December 19, 2023   dapagliflozin propanediol 10 MG Tabs tablet Commonly known as: FARXIGA Take 5 mg by mouth in the morning and at bedtime.   Eliquis 5 MG Tabs tablet Generic drug: apixaban Take 1 tablet (5 mg total) by mouth 2 (two) times daily.   losartan 25 MG tablet Commonly known as: COZAAR Take 0.5 tablets (12.5 mg total) by mouth daily. What changed: how much to take   metoprolol succinate 25 MG 24 hr tablet Commonly known as: TOPROL-XL Take 1 tablet (25 mg total) by mouth daily.   multivitamin with minerals tablet Take 1 tablet by mouth daily.   MYLANTA PO Take 1 tablet by mouth daily as needed (indigestion).   PROBIOTIC ACIDOPHILUS PO Take 1 tablet by mouth daily.   simethicone 125 MG chewable tablet Commonly known as: MYLICON  Chew 125 mg by mouth every 6 (six) hours as needed for flatulence.        Follow-up Information     Stamey, Verda Cumins, FNP. Call on 12/26/2023.   Specialty: Family Medicine Why: 3 pm for hospital follow up Contact information: [address] [place] [address] [phone]                Discharge Exam: Ceasar Mons Weights   12/17/23 1805 12/18/23 2115  Weight: 55.3 kg 56.9 kg   Physical Exam on Day of Discharge   General: Alert, cheerful, oriented X3  Oral cavity: moist mucous membranes  Neck: supple  Chest: clear to auscultation.  No crackles, no wheezes  CVS: S1,S2 RRR. No murmurs  Abd: No distention, soft, non-tender. No masses palpable  Extr: No edema    Condition at discharge: stable  The results of significant diagnostics from this hospitalization (including imaging, microbiology, ancillary and laboratory) are listed below for reference.   Imaging Studies: CT Angio Chest Pulmonary Embolism (PE) W or WO Contrast Result Date: 12/18/2023 CLINICAL DATA:  Pulmonary embolism suspected EXAM: CT ANGIOGRAPHY CHEST WITH CONTRAST TECHNIQUE: Multidetector CT imaging of the chest was performed using the standard protocol during bolus administration of intravenous contrast. Multiplanar CT image reconstructions and MIPs were obtained to evaluate the vascular anatomy. RADIATION DOSE REDUCTION: This exam was performed according to the departmental dose-optimization program which includes automated exposure control, adjustment of the mA and/or kV according to patient size and/or use of iterative reconstruction technique. CONTRAST:  75mL OMNIPAQUE IOHEXOL 350 MG/ML SOLN COMPARISON:  None Available. FINDINGS: Cardiovascular: Satisfactory opacification of the pulmonary arteries to the segmental level. No evidence of pulmonary embolism. Enlarged heart size. No pericardial effusion. ICD/pacer leads, dual-chamber from the left. Mediastinum/Nodes: No mass or adenopathy. Lungs/Pleura: Small pleural effusions with lower lobe atelectasis. 3 mm average diameter pulmonary nodule in the right anterior lung is stable from prior PET. No suspicious nodule. No consolidation or interstitial edema. The central airways are clear. Upper Abdomen: No acute finding Musculoskeletal: No acute finding Review of the MIP images confirms the above findings. IMPRESSION: 1. Negative for pulmonary embolism. 2. Cardiomegaly with small pleural effusions and lower lobe atelectasis. Electronically Signed   By: Tiburcio Pea M.D.   On: 12/18/2023 04:41   DG Chest Portable 1  View Result Date: 12/17/2023 CLINICAL DATA:  Short of breath, left upper back pain, tachycardia EXAM: PORTABLE CHEST 1 VIEW COMPARISON:  12/14/2023 FINDINGS: Single frontal view of the chest demonstrates stable dual lead pacer/AICD. The cardiac silhouette is unchanged. Interval development of bibasilar consolidation and small bilateral pleural effusions. No pneumothorax. No acute bony abnormalities. IMPRESSION: 1. Interval development of small bilateral pleural effusions and bibasilar consolidation. 2. Stable enlarged cardiac silhouette. Electronically Signed   By: Sharlet Salina M.D.   On: 12/17/2023 19:05   DG Chest 2 View Result Date: 12/14/2023 CLINICAL DATA:  Defibrillator placement. EXAM: CHEST - 2 VIEW COMPARISON:  December 02, 2020. FINDINGS: Stable cardiomediastinal silhouette. Interval placement of left-sided defibrillator with leads in grossly good position. No pneumothorax. Both lungs are clear. The visualized skeletal structures are unremarkable. IMPRESSION: Interval placement of left-sided defibrillator. Electronically Signed   By: Lupita Raider M.D.   On: 12/14/2023 14:35   EP PPM/ICD IMPLANT Result Date: 12/14/2023  CONCLUSIONS:  1. Chronic systolic heart failure  2. Successful ICD implantation with a Medtronic DDD ICD implanted for primary prevention of sudden death.  3.  No early apparent complications.  Microbiology: Results for orders placed or performed during the hospital encounter of 09/26/20  Resp Panel by RT-PCR (Flu A&B, Covid) Nasopharyngeal Swab     Status: None   Collection Time: 09/26/20  7:32 AM   Specimen: Nasopharyngeal Swab; Nasopharyngeal(NP) swabs in vial transport medium  Result Value Ref Range Status   SARS Coronavirus 2 by RT PCR NEGATIVE NEGATIVE Final    Comment: (NOTE) SARS-CoV-2 target nucleic acids are NOT DETECTED.  The SARS-CoV-2 RNA is generally detectable in upper respiratory specimens during the acute phase of infection. The lowest concentration  of SARS-CoV-2 viral copies this assay can detect is 138 copies/mL. A negative result does not preclude SARS-Cov-2 infection and should not be used as the sole basis for treatment or other patient management decisions. A negative result may occur with  improper specimen collection/handling, submission of specimen other than nasopharyngeal swab, presence of viral mutation(s) within the areas targeted by this assay, and inadequate number of viral copies(<138 copies/mL). A negative result must be combined with clinical observations, patient history, and epidemiological information. The expected result is Negative.  Fact Sheet for Patients:  BloggerCourse.com  Fact Sheet for Healthcare Providers:  SeriousBroker.it  This test is no t yet approved or cleared by the [place] FDA and  has been authorized for detection and/or diagnosis of SARS-CoV-2 by FDA under an Emergency Use Authorization (EUA). This EUA will remain  in effect (meaning this test can be used) for the duration of the COVID-19 declaration under Section 564(b)(1) of the Act, 21 U.S.C.section 360bbb-3(b)(1), unless the authorization is terminated  or revoked sooner.       Influenza A by PCR NEGATIVE NEGATIVE Final   Influenza B by PCR NEGATIVE NEGATIVE Final    Comment: (NOTE) The Xpert Xpress SARS-CoV-2/FLU/RSV plus assay is intended as an aid in the diagnosis of influenza from Nasopharyngeal swab specimens and should not be used as a sole basis for treatment. Nasal washings and aspirates are unacceptable for Xpert Xpress SARS-CoV-2/FLU/RSV testing.  Fact Sheet for Patients: BloggerCourse.com  Fact Sheet for Healthcare Providers: SeriousBroker.it  This test is not yet approved or cleared by the [place] FDA and has been authorized for detection and/or diagnosis of SARS-CoV-2 by FDA under an Emergency Use  Authorization (EUA). This EUA will remain in effect (meaning this test can be used) for the duration of the COVID-19 declaration under Section 564(b)(1) of the Act, 21 U.S.C. section 360bbb-3(b)(1), unless the authorization is terminated or revoked.  Performed at [facility] [address], [place], [place] [address]     Labs: CBC: Recent Labs  Lab 12/17/23 1843 12/18/23 0230 12/19/23 0310  WBC 10.1 11.3* 8.6  HGB 13.0 13.7 11.2*  HCT 38.7 41.4 33.3*  MCV 95.3 96.7 95.7  PLT 240 244 210   Basic Metabolic Panel: Recent Labs  Lab 12/17/23 1843 12/17/23 2231 12/18/23 0230 12/19/23 0310  NA 137  --  139 138  K 4.0  --  4.0 3.5  CL 103  --  105 104  CO2 22  --  22 25  GLUCOSE 107*  --  99 120*  BUN 16  --  10 8  CREATININE 0.87  --  0.78 0.75  CALCIUM 9.4  --  9.1 8.6*  MG 2.2 2.1  --  2.4  PHOS  --  2.3*  --   --    Liver Function Tests: Recent Labs  Lab 12/18/23 0230  AST 80*  ALT 91*  ALKPHOS  68  BILITOT 1.2  PROT 6.9  ALBUMIN 3.8   CBG: No results for input(s): "GLUCAP" in the last 168 hours.  Discharge time spent: greater than 30 minutes.  Signed: MDALA-GAUSI, Gwenette Greet, MD [facility] 12/19/2023

## 2023-12-19 NOTE — Discharge Instructions (Signed)
 Information on my medicine - ELIQUIS (apixaban)  This medication education was reviewed with me or my healthcare representative as part of my discharge preparation.     Why was Eliquis prescribed for you? Eliquis was prescribed for you to reduce the risk of a blood clot forming that can cause a stroke if you have a medical condition called atrial fibrillation (a type of irregular heartbeat).  What do You need to know about Eliquis ? Take your Eliquis TWICE DAILY - one tablet in the morning and one tablet in the evening with or without food. If you have difficulty swallowing the tablet whole please discuss with your pharmacist how to take the medication safely.  Take Eliquis exactly as prescribed by your doctor and DO NOT stop taking Eliquis without talking to the doctor who prescribed the medication.  Stopping may increase your risk of developing a stroke.  Refill your prescription before you run out.  After discharge, you should have regular check-up appointments with your healthcare provider that is prescribing your Eliquis.  In the future your dose may need to be changed if your kidney function or weight changes by a significant amount or as you get older.  What do you do if you miss a dose? If you miss a dose, take it as soon as you remember on the same day and resume taking twice daily.  Do not take more than one dose of ELIQUIS at the same time to make up a missed dose.  Important Safety Information A possible side effect of Eliquis is bleeding. You should call your healthcare provider right away if you experience any of the following: Bleeding from an injury or your nose that does not stop. Unusual colored urine (red or dark brown) or unusual colored stools (red or black). Unusual bruising for unknown reasons. A serious fall or if you hit your head (even if there is no bleeding).  Some medicines may interact with Eliquis and might increase your risk of bleeding or clotting  while on Eliquis. To help avoid this, consult your healthcare provider or pharmacist prior to using any new prescription or non-prescription medications, including herbals, vitamins, non-steroidal anti-inflammatory drugs (NSAIDs) and supplements.  This website has more information on Eliquis (apixaban): http://www.eliquis.com/eliquis/home =======================================  Atrial Fibrillation    Atrial fibrillation is a type of heartbeat that is irregular or fast. If you have this condition, your heart beats without any order. This makes it hard for your heart to pump blood in a normal way. Atrial fibrillation may come and go, or it may become a long-lasting problem. If this condition is not treated, it can put you at higher risk for stroke, heart failure, and other heart problems.  What are the causes? This condition may be caused by diseases that damage the heart. They include: High blood pressure. Heart failure. Heart valve disease. Heart surgery. Other causes include: Diabetes. Thyroid disease. Being overweight. Kidney disease. Sometimes the cause is not known.  What increases the risk? You are more likely to develop this condition if: You are older. You smoke. You exercise often and very hard. You have a family history of this condition. You are a man. You use drugs. You drink a lot of alcohol. You have lung conditions, such as emphysema, pneumonia, or COPD. You have sleep apnea.  What are the signs or symptoms? Common symptoms of this condition include: A feeling that your heart is beating very fast. Chest pain or discomfort. Feeling short of breath. Suddenly feeling  light-headed or weak. Getting tired easily during activity. Fainting. Sweating. In some cases, there are no symptoms.  How is this treated? Treatment for this condition depends on underlying conditions and how you feel when you have atrial fibrillation. They include: Medicines to: Prevent  blood clots. Treat heart rate or heart rhythm problems. Using devices, such as a pacemaker, to correct heart rhythm problems. Doing surgery to remove the part of the heart that sends bad signals. Closing an area where clots can form in the heart (left atrial appendage). In some cases, your doctor will treat other underlying conditions.  Follow these instructions at home:  Medicines Take over-the-counter and prescription medicines only as told by your doctor. Do not take any new medicines without first talking to your doctor. If you are taking blood thinners: Talk with your doctor before you take any medicines that have aspirin or NSAIDs, such as ibuprofen, in them. Take your medicine exactly as told by your doctor. Take it at the same time each day. Avoid activities that could hurt or bruise you. Follow instructions about how to prevent falls. Wear a bracelet that says you are taking blood thinners. Or, carry a card that lists what medicines you take. Lifestyle         Do not use any products that have nicotine or tobacco in them. These include cigarettes, e-cigarettes, and chewing tobacco. If you need help quitting, ask your doctor. Eat heart-healthy foods. Talk with your doctor about the right eating plan for you. Exercise regularly as told by your doctor. Do not drink alcohol. Lose weight if you are overweight. Do not use drugs, including cannabis.  General instructions If you have a condition that causes breathing to stop for a short period of time (apnea), treat it as told by your doctor. Keep a healthy weight. Do not use diet pills unless your doctor says they are safe for you. Diet pills may make heart problems worse. Keep all follow-up visits as told by your doctor. This is important.  Contact a doctor if: You notice a change in the speed, rhythm, or strength of your heartbeat. You are taking a blood-thinning medicine and you get more bruising. You get tired more easily  when you move or exercise. You have a sudden change in weight.  Get help right away if:    You have pain in your chest or your belly (abdomen). You have trouble breathing. You have side effects of blood thinners, such as blood in your vomit, poop (stool), or pee (urine), or bleeding that cannot stop. You have any signs of a stroke. "BE FAST" is an easy way to remember the main warning signs: B - Balance. Signs are dizziness, sudden trouble walking, or loss of balance. E - Eyes. Signs are trouble seeing or a change in how you see. F - Face. Signs are sudden weakness or loss of feeling in the face, or the face or eyelid drooping on one side. A - Arms. Signs are weakness or loss of feeling in an arm. This happens suddenly and usually on one side of the body. S - Speech. Signs are sudden trouble speaking, slurred speech, or trouble understanding what people say. T - Time. Time to call emergency services. Write down what time symptoms started. You have other signs of a stroke, such as: A sudden, very bad headache with no known cause. Feeling like you may vomit (nausea). Vomiting. A seizure.  These symptoms may be an emergency. Do not wait to  see if the symptoms will go away. Get medical help right away. Call your local emergency services (911 in the [place] Do not drive yourself to the hospital. Summary Atrial fibrillation is a type of heartbeat that is irregular or fast. You are at higher risk of this condition if you smoke, are older, have diabetes, or are overweight. Follow your doctor's instructions about medicines, diet, exercise, and follow-up visits. Get help right away if you have signs or symptoms of a stroke. Get help right away if you cannot catch your breath, or you have chest pain or discomfort. This information is not intended to replace advice given to you by your health care provider. Make sure you discuss any questions you have with your health care provider. Document  Revised: 03/13/2019 Document Reviewed: 03/13/2019 Elsevier Patient Education  2020 [employer].

## 2023-12-19 NOTE — Progress Notes (Signed)
  Patient Name: Janet Ramirez Date of Encounter: 12/19/2023  Primary Cardiologist: None Electrophysiologist: Lanier Prude, MD  Interval Summary   The patient is doing well today.  At this time, the patient denies chest pain, shortness of breath, or any new concerns.  Vital Signs    Vitals:   12/18/23 2206 12/19/23 0124 12/19/23 0427 12/19/23 0721  BP:  108/66 105/68 116/78  Pulse: 72 73 74 72  Resp:  18 18 19   Temp:  98.6 F (37 C) (!) 97.1 F (36.2 C) 97.9 F (36.6 C)  TempSrc:  Oral Oral Oral  SpO2: 98% 96% 95% 96%  Weight:      Height:        Intake/Output Summary (Last 24 hours) at 12/19/2023 0744 Last data filed at 12/19/2023 0138 Gross per 24 hour  Intake --  Output 300 ml  Net -300 ml   Filed Weights   12/17/23 1805 12/18/23 2115  Weight: 55.3 kg 56.9 kg    Physical Exam    GEN- The patient is well appearing, alert and oriented x 3 today.   Lungs- Clear to ausculation bilaterally, normal work of breathing Cardiac- Regular rate and rhythm, no murmurs, rubs or gallops GI- soft, NT, ND, + BS Extremities- no clubbing or cyanosis. No edema  Telemetry    Converted to NSR just prior to 1600; NSR 70s through the night (personally reviewed)  Hospital Course    Janet Ramirez is a 63 y.o. female with a history of PVCs and chronic systolic CHF s/p ICD who is being seen today for the evaluation of AF with RVR at the request of Dr. Eden Emms.   Assessment & Plan    Paroxysmal AF New diagnosis. Started 3/16. Converted overnight on IV amiodarone Transition to po amiodarone 200 mg BID x 14 days, then to 200 mg daily.  Continue eliquis 5 mg BID for CHA2DS2/VASc is at least 2. Change coreg to Toprol 25 mg daily   Acute on Chronic systolic CHF S/p Dual Chamber ICD Volume status appears stable. Leads look OK on CXR.  Chest discomfort improved burping, also with maalox and tylenol. Likely GI.  Pt refusing coreg with h/o BP drops; Will change to Toprol 25 mg daily Resume  losartan at 12.5 mg daily.    PVCs Stop mexitil while on amiodarone.    Gas Epigastric pain Chronic issue, has historically occurred with lying flat Continue prn simethicone  OK for home from EP perspective.  Follow up in place Meds for home (Reconciled by me for continuity) Amiodarone 200 mg BID x 14 days, then 200 mg daily Toprol 25 mg daily Losartan 12.5 mg daily  Resume Farxiga   For questions or updates, please contact [facility] Please consult [email or web address] for contact info under Cardiology/STEMI.  Signed, Graciella Freer, PA-C  12/19/2023, 7:44 AM

## 2023-12-19 NOTE — Plan of Care (Signed)
   Problem: Education: Goal: Knowledge of General Education information will improve Description Including pain rating scale, medication(s)/side effects and non-pharmacologic comfort measures Outcome: Progressing   Problem: Health Behavior/Discharge Planning: Goal: Ability to manage health-related needs will improve Outcome: Progressing

## 2023-12-26 ENCOUNTER — Other Ambulatory Visit (HOSPITAL_COMMUNITY): Payer: Self-pay | Admitting: Internal Medicine

## 2023-12-26 DIAGNOSIS — I4891 Unspecified atrial fibrillation: Secondary | ICD-10-CM | POA: Diagnosis not present

## 2023-12-26 DIAGNOSIS — K5909 Other constipation: Secondary | ICD-10-CM | POA: Diagnosis not present

## 2023-12-28 ENCOUNTER — Ambulatory Visit: Attending: Cardiology

## 2023-12-28 DIAGNOSIS — I5022 Chronic systolic (congestive) heart failure: Secondary | ICD-10-CM | POA: Diagnosis not present

## 2023-12-28 LAB — CUP PACEART INCLINIC DEVICE CHECK
Date Time Interrogation Session: 20250327131723
Implantable Lead Connection Status: 753985
Implantable Lead Connection Status: 753985
Implantable Lead Implant Date: 20250313
Implantable Lead Implant Date: 20250313
Implantable Lead Location: 753859
Implantable Lead Location: 753860
Implantable Lead Model: 5076
Implantable Pulse Generator Implant Date: 20250313

## 2023-12-28 NOTE — Patient Instructions (Addendum)

## 2023-12-28 NOTE — Progress Notes (Signed)
 Normal single chamber ICD wound check. Wound well healed. Presenting rhythm: AS/VS- 70 . Routine testing performed. Thresholds, sensing, and impedances consistent with implant measurements with 3.5V safety margin/auto capture until 3 month visit. No treated arrhythmias. Reviewed arm restrictions to continue for 6 weeks total post op. Reviewed shock plan.  Pt enrolled in remote follow-up.

## 2023-12-30 ENCOUNTER — Encounter: Payer: Self-pay | Admitting: Cardiology

## 2024-01-03 ENCOUNTER — Telehealth: Payer: Self-pay

## 2024-01-03 NOTE — Telephone Encounter (Signed)
 Outreach made to Pt.  Per Pt current BP is 107/87 and HR is 134.  Discussed with Dr. Lalla Brothers.  Will have Pt continue amiodarone 200 mg BID for another 5 days before decreasing to daily.  Will also have Pt take an additional Toprol XL 25 mg tonight.  Advised Pt would call her tomorrow morning to reevaluate.  Pt indicates understanding of plan.

## 2024-01-03 NOTE — Telephone Encounter (Signed)
 Alert received from [employer]:  Alert remote transmission:  AT/AF Daily Burden > Threshold AF in progress from 4/2, not always good rate control, Eliquis per PA report   Outreach made to Pt.  Per Pt she had noticed that she had a "different" feeling in her chest when she woke up today.  States she has noticed some pain in her back and occasional SOB today.  Her BP is 133/77 and heart rate 132.  She states when she checked her heart rate earlier it was 97 bpm.  She states her normal heart rate is in the 60's.    Today was the first day of her decreasing her amiodarone 200 mg to once by  mouth daily.  She has taken her daily dose of amiodarone and her Toprol XL 25 mg.  She states she took these at 8:00 am this morning.    Discussed with Dr. Lalla Brothers.  Will have Pt take an additional Toprol XL 25 mg tablet and continue to monitor.  Advised Pt.  Will call back this afternoon.

## 2024-01-04 NOTE — Telephone Encounter (Signed)
 Outreach made to Pt.  Advised Pt had returned to normal rhythm.  She will call device clinic for any further needs.

## 2024-01-08 DIAGNOSIS — R051 Acute cough: Secondary | ICD-10-CM | POA: Diagnosis not present

## 2024-01-13 ENCOUNTER — Other Ambulatory Visit (HOSPITAL_COMMUNITY): Payer: Self-pay

## 2024-01-19 ENCOUNTER — Ambulatory Visit: Attending: Cardiology | Admitting: Student

## 2024-01-19 ENCOUNTER — Encounter: Payer: Self-pay | Admitting: Student

## 2024-01-19 VITALS — BP 128/84 | HR 61 | Ht 64.0 in | Wt 121.0 lb

## 2024-01-19 DIAGNOSIS — I4891 Unspecified atrial fibrillation: Secondary | ICD-10-CM | POA: Diagnosis not present

## 2024-01-19 DIAGNOSIS — I5022 Chronic systolic (congestive) heart failure: Secondary | ICD-10-CM

## 2024-01-19 DIAGNOSIS — Z79899 Other long term (current) drug therapy: Secondary | ICD-10-CM

## 2024-01-19 DIAGNOSIS — Z9189 Other specified personal risk factors, not elsewhere classified: Secondary | ICD-10-CM | POA: Diagnosis not present

## 2024-01-19 DIAGNOSIS — I428 Other cardiomyopathies: Secondary | ICD-10-CM | POA: Diagnosis not present

## 2024-01-19 DIAGNOSIS — I493 Ventricular premature depolarization: Secondary | ICD-10-CM | POA: Diagnosis not present

## 2024-01-19 MED ORDER — AMIODARONE HCL 200 MG PO TABS: 200.0000 mg | ORAL_TABLET | Freq: Every day | ORAL | 3 refills | Status: DC

## 2024-01-19 NOTE — Patient Instructions (Signed)
 Medication Instructions:  Your physician recommends that you continue on your current medications as directed. Please refer to the Current Medication list given to you today.  *If you need a refill on your cardiac medications before your next appointment, please call your pharmacy*  Lab Work: CMET, TSH, FreeT4-TODAY If you have labs (blood work) drawn today and your tests are completely normal, you will receive your results only by: MyChart Message (if you have MyChart) OR A paper copy in the mail If you have any lab test that is abnormal or we need to change your treatment, we will call you to review the results.  Follow-Up: At [facility], you and your health needs are our priority.  As part of our continuing mission to provide you with exceptional heart care, our providers are all part of one team.  This team includes your primary Cardiologist (physician) and Advanced Practice Providers or APPs (Physician Assistants and Nurse Practitioners) who all work together to provide you with the care you need, when you need it.  Your next appointment:   As scheduled    1st Floor: - Lobby - Registration  - Pharmacy  - Lab - Cafe  2nd Floor: - PV Lab - Diagnostic Testing (echo, CT, nuclear med)  3rd Floor: - Vacant  4th Floor: - TCTS (cardiothoracic surgery) - AFib Clinic - Structural Heart Clinic - Vascular Surgery  - Vascular Ultrasound  5th Floor: - HeartCare Cardiology (general and EP) - Clinical Pharmacy for coumadin , hypertension, lipid, weight-loss medications, and med management appointments    Valet parking services will be available as well.

## 2024-01-19 NOTE — Progress Notes (Signed)
  Electrophysiology Office Note:   ID:  Janet Ramirez, DOB [birth year], MRN [id number]  Primary Cardiologist: None Electrophysiologist: Boyce Byes, MD      History of Present Illness:   Janet Ramirez is a 63 y.o. female with h/o PVCs, PAF, and chronic systolic CHF s/p ICD seen today for routine electrophysiology followup.   S/p ICD 12/14/2023.    Presented back to the ED 3/17 with new onset AF. Cardioverted in ED but had ERAF. Started on amiodarone  and converted back to Sinus.   Since last being seen in our clinic the patient reports doing OK. She has had a slow recovery and has also had a URI. She is not currently interested in discussing any additional procedures. Currently, she denies chest pain, palpitations, PND, orthopnea, nausea, vomiting, dizziness, syncope, edema, weight gain, or early satiety.  Has mild dyspnea with more moderate exertion.  Review of systems complete and found to be negative unless listed in HPI.   EP Information / Studies Reviewed:    EKG is not ordered today. EKG from 12/18/2023 reviewed which showed A paced rhythm in 80s.       ICD Interrogation-  No full check done today. Just connected to assess rhythm.   Arrhythmia/Device History Medtronic Dual Chamber ICD 12/14/23 for CHF   Physical Exam:   VS:  BP 128/84   Pulse 61   Ht 5\' 4"  (1.626 m)   Wt 121 lb (54.9 kg)   SpO2 95%   BMI 20.77 kg/m    Wt Readings from Last 3 Encounters:  01/19/24 121 lb (54.9 kg)  12/18/23 125 lb 7.1 oz (56.9 kg)  12/14/23 122 lb (55.3 kg)     GEN: No acute distress  NECK: No JVD; No carotid bruits CARDIAC: Regular rate and rhythm, no murmurs, rubs, gallops RESPIRATORY:  Clear to auscultation without rales, wheezing or rhonchi  ABDOMEN: Soft, non-tender, non-distended EXTREMITIES:  No edema; No deformity   ASSESSMENT AND PLAN:    Chronic systolic CHF  s/p Medtronic dual chamber ICD  euvolemic today Stable on an appropriate medical regimen Normal ICD function  with recent check.  No changes today Had brief elevation in RV tip to coil impedance but normal today. Will follow.   Paroxysmal AF New diagnosis post device implant NSR today by brief device check.  Continue amiodarone  200 mg daily.  Continue eliquis  5 mg BID for CHA2DS2VASc of at least 2  Not currently interested in ablation.   PVCs Off mexitil while on amiodarone .   Disposition:   Follow up with EP APP in 2 months as scheduled.    Signed, Tylene Galla, PA-C

## 2024-01-20 LAB — COMPREHENSIVE METABOLIC PANEL WITH GFR
ALT: 32 IU/L (ref 0–32)
AST: 34 IU/L (ref 0–40)
Albumin: 4.2 g/dL (ref 3.9–4.9)
Alkaline Phosphatase: 113 IU/L (ref 44–121)
BUN/Creatinine Ratio: 13 (ref 12–28)
BUN: 12 mg/dL (ref 8–27)
Bilirubin Total: <0.2 mg/dL (ref 0.0–1.2)
CO2: 22 mmol/L (ref 20–29)
Calcium: 9.4 mg/dL (ref 8.7–10.3)
Chloride: 100 mmol/L (ref 96–106)
Creatinine, Ser: 0.94 mg/dL (ref 0.57–1.00)
Globulin, Total: 2.7 g/dL (ref 1.5–4.5)
Glucose: 133 mg/dL — ABNORMAL HIGH (ref 70–99)
Potassium: 4.2 mmol/L (ref 3.5–5.2)
Sodium: 140 mmol/L (ref 134–144)
Total Protein: 6.9 g/dL (ref 6.0–8.5)
eGFR: 68 mL/min/{1.73_m2} (ref >59)

## 2024-01-20 LAB — TSH: TSH: 4.01 u[IU]/mL (ref 0.450–4.500)

## 2024-01-20 LAB — T4, FREE: Free T4: 1.24 ng/dL (ref 0.82–1.77)

## 2024-01-25 ENCOUNTER — Ambulatory Visit (INDEPENDENT_AMBULATORY_CARE_PROVIDER_SITE_OTHER)

## 2024-01-25 DIAGNOSIS — I5022 Chronic systolic (congestive) heart failure: Secondary | ICD-10-CM | POA: Diagnosis not present

## 2024-01-25 LAB — CUP PACEART REMOTE DEVICE CHECK
Battery Remaining Longevity: 141 mo
Battery Voltage: 3.17 V
Brady Statistic AP VP Percent: 0.03 %
Brady Statistic AP VS Percent: 65.22 %
Brady Statistic AS VP Percent: 0 %
Brady Statistic AS VS Percent: 34.75 %
Brady Statistic RA Percent Paced: 65.92 %
Brady Statistic RV Percent Paced: 0.03 %
Date Time Interrogation Session: 20250423230255
HighPow Impedance: 52 Ohm
Implantable Lead Connection Status: 753985
Implantable Lead Connection Status: 753985
Implantable Lead Implant Date: 20250313
Implantable Lead Implant Date: 20250313
Implantable Lead Location: 753859
Implantable Lead Location: 753860
Implantable Lead Model: 5076
Implantable Pulse Generator Implant Date: 20250313
Lead Channel Impedance Value: 209 Ohm
Lead Channel Impedance Value: 285 Ohm
Lead Channel Impedance Value: 437 Ohm
Lead Channel Pacing Threshold Amplitude: 0.375 V
Lead Channel Pacing Threshold Amplitude: 1.5 V
Lead Channel Pacing Threshold Pulse Width: 0.4 ms
Lead Channel Pacing Threshold Pulse Width: 0.4 ms
Lead Channel Sensing Intrinsic Amplitude: 1.4 mV
Lead Channel Sensing Intrinsic Amplitude: 8 mV
Lead Channel Setting Pacing Amplitude: 3.5 V
Lead Channel Setting Pacing Amplitude: 3.5 V
Lead Channel Setting Pacing Pulse Width: 0.4 ms
Lead Channel Setting Sensing Sensitivity: 0.3 mV
Zone Setting Status: 755011
Zone Setting Status: 755011
Zone Setting Status: 755011

## 2024-01-28 ENCOUNTER — Encounter: Payer: Self-pay | Admitting: Cardiology

## 2024-02-20 DIAGNOSIS — J029 Acute pharyngitis, unspecified: Secondary | ICD-10-CM | POA: Diagnosis not present

## 2024-02-20 DIAGNOSIS — Z6820 Body mass index (BMI) 20.0-20.9, adult: Secondary | ICD-10-CM | POA: Diagnosis not present

## 2024-02-20 DIAGNOSIS — Z8639 Personal history of other endocrine, nutritional and metabolic disease: Secondary | ICD-10-CM | POA: Diagnosis not present

## 2024-03-05 NOTE — Progress Notes (Signed)
 Remote ICD transmission.

## 2024-03-07 ENCOUNTER — Encounter: Payer: Self-pay | Admitting: Cardiology

## 2024-03-08 NOTE — Telephone Encounter (Signed)
 Amiodarone  can cause skin photosensitivity, but that's about all I see in her med list.  Nothing that should be making her break out in a sweat

## 2024-03-14 NOTE — Progress Notes (Signed)
 Cardiology Office Note:  .   Date:  03/14/2024  ID:  Janet Ramirez, DOB 08/30/1961, MRN [id number] PCP: Ayesha Lente Lowell Rude, FNP  [facility] Providers Cardiologist:  Dr. Julane Ny Electrophysiologist:  Boyce Byes, MD {  History of Present Illness: .   Janet Ramirez is a 63 y.o. female w/PMHx of  HTN, hypothyroidism PVCs, NICM, chronic CHF (systolic) AFib  heart failure dates back to 2021  ejection fraction of 25%.  Cath showed normal coronary arteries.  She had a PVC burden of 6%.  Cardiac MRI in 2022 showed EF of 35% with basal septal mid wall LGE.  In April 2020 4 repeat ZIO monitor showed a 15% burden of PVCs. >>> Mexiletine was started A ZIO monitor in 2022 showed a PVC burden of 2%. Jan 2025> PVCs 8.9%, 3 morphologies  Referred to Dr. Marven Slimmer, saw him 12/04/23  PVCs felt to be secondary to her CM with variable burden, not the cause > not felt indicated for PVC ablation Did though recommend ICD implant for primary prevention  ICD implant 12/14/23  Admitted 12/17/23 w/new onset palpitations found in AFib > DCCV in the ER failed to convert her and started on amiodarone  >> chemically converted to SR Coreg  stopped > started on metoprolol  Mexiletine stopped > amiodarone  continued  She saw Jonelle Neri 01/19/24, doing OK, getting over a URI, was not wanting to pursue any further procedures at least in the near future She was A paced, amiodarone  continued Consider ablation when/if agreeable  Today's visit is scheduled as her post implant visit ROS:   She is accompanied by her husband Still has some awareness of the device, perhaps a bit tender when pressed on. No CP, palpitations No SOB No near syncope or syncope but does gat transiently lightheaded on occasion.  She has been getting funny flushed/hot sensations, randomly and when gone her skin felt prickly. Not every day and with no pattern and do seem to be settling away Once with one she was able to check BP was  ~106/60   Device information MDT dual chamber ICD implanted 12/14/23  Arrhythmia/AAD hx PVCs ~ 2021 Mexiletine started for PVCs >  stopped with development of Afib AFib found March 2025 and started on amiodarone    Studies Reviewed: Aaron Aas    EKG not done today  DEVICE interrogation done today and reviewed by myself Battery and lead measurements are good/stable No arrhythmias PVCs 3/hr AP 61.5% VP <0.1% Lead outputs reduced to chronic outputs today    Jan 2025, monitor 1. Sinus rhythm - Patient had a min HR of 48 bpm, max HR of 162 bpm, and avg HR of 65 bpm.  2. Five runs of Supraventricular Tachycardia runs occurred, the run with the fastest interval lasting 11 beats with a max rate of 162 bpm (avg 150 bpm); the run with the fastest interval was also the longest. Isolated SVEs were occasional (1.5%, 3473), S 3. Frequent PVCs (8.9%, 30160),  3 morphologies (8.6% for the mos common morphology) 4. Ventricular Bigeminy and Trigeminy were present.   09/25/23: TTE  1. Left ventricular ejection fraction, by estimation, is 30 to 35%. Left  ventricular ejection fraction by 3D volume is 33 %. The left ventricle has  moderately decreased function. The left ventricle demonstrates global  hypokinesis. The left ventricular  internal cavity size was moderately dilated. Left ventricular diastolic  parameters are indeterminate.   2. Right ventricular systolic function is normal. The right ventricular  size is normal. There is  mildly elevated pulmonary artery systolic  pressure. The estimated right ventricular systolic pressure is 36.9 mmHg.   3. Left atrial size was severely dilated.   4. Right atrial size was mildly dilated.   5. The mitral valve is abnormal. Mild to moderate mitral valve  regurgitation. No evidence of mitral stenosis.   6. The aortic valve is normal in structure. Aortic valve regurgitation is  mild. No aortic stenosis is present.   7. The inferior vena cava is normal in size  with <50% respiratory  variability, suggesting right atrial pressure of 8 mmHg.     Risk Assessment/Calculations:    Physical Exam:   VS:  There were no vitals taken for this visit.   Wt Readings from Last 3 Encounters:  01/19/24 121 lb (54.9 kg)  12/18/23 125 lb 7.1 oz (56.9 kg)  12/14/23 122 lb (55.3 kg)    GEN: Well nourished, well developed in no acute distress NECK: No JVD; No carotid bruits CARDIAC: RRR, no murmurs, rubs, gallops RESPIRATORY:  CTA b/l without rales, wheezing or rhonchi  ABDOMEN: Soft, non-tender, non-distended EXTREMITIES: No edema; No deformity   ICD site: well healed, no erythema, edema, heat,  is stable, no thinning, fluctuation, tethering No sign of infection  ASSESSMENT AND PLAN: .    ICD intact function Lead outputs to chronic   She reports some awareness of her device, maybe a bit tender Pocket looks excellent Advised if this doesn't resolve or if she has any escalation to make us  aware  paroxysmal AFib CHA2DS2Vasc is 3, on Eliquis , appropriately dosed Amiodarone  zero % burden (since 01/19/24)  Discussed preference would be to eventually consider getting her off amiodarone  Consider ablation Wants to put some distance between her ICD and any more procedures. Though wants to be off amiod eventually as well Will get LFTs/TSH today  Will have her see Dr. Marven Slimmer in 3-4 mo, revisit AFib management Amio/meds PVCs  NICM OptiVol data is early, is below threshold No symptoms or exam findings of volume OL C/we Dr. Roena Clark  Secondary hypercoagulable state 2/2 AFib  5. PVCs Low burden 3/h On amiodarone       Dispo: ***  Signed, Debbie Fails, PA-C

## 2024-03-15 ENCOUNTER — Ambulatory Visit: Attending: Physician Assistant | Admitting: Physician Assistant

## 2024-03-15 VITALS — BP 118/68 | HR 66 | Ht 64.0 in | Wt 123.0 lb

## 2024-03-15 DIAGNOSIS — I428 Other cardiomyopathies: Secondary | ICD-10-CM | POA: Diagnosis not present

## 2024-03-15 DIAGNOSIS — D6869 Other thrombophilia: Secondary | ICD-10-CM

## 2024-03-15 DIAGNOSIS — Z95810 Presence of automatic (implantable) cardiac defibrillator: Secondary | ICD-10-CM

## 2024-03-15 DIAGNOSIS — I493 Ventricular premature depolarization: Secondary | ICD-10-CM

## 2024-03-15 DIAGNOSIS — I480 Paroxysmal atrial fibrillation: Secondary | ICD-10-CM

## 2024-03-15 DIAGNOSIS — Z79899 Other long term (current) drug therapy: Secondary | ICD-10-CM | POA: Diagnosis not present

## 2024-03-15 LAB — CUP PACEART INCLINIC DEVICE CHECK
Date Time Interrogation Session: 20250613120133
Implantable Lead Connection Status: 753985
Implantable Lead Connection Status: 753985
Implantable Lead Implant Date: 20250313
Implantable Lead Implant Date: 20250313
Implantable Lead Location: 753859
Implantable Lead Location: 753860
Implantable Lead Model: 5076
Implantable Pulse Generator Implant Date: 20250313

## 2024-03-15 NOTE — Patient Instructions (Signed)
 Medication Instructions:    Your physician recommends that you continue on your current medications as directed. Please refer to the Current Medication list given to you today.   *If you need a refill on your cardiac medications before your next appointment, please call your pharmacy*   Lab Work:   PLEASE GO DOWN STAIRS  [facility]  FIRST FLOOR   ( GET OFF ELEVATORS WALK TOWARDS WAITING AREA LAB LOCATED BY PHARMACY):   LFT AND TSH TODAY     If you have labs (blood work) drawn today and your tests are completely normal, you will receive your results only by: MyChart Message (if you have MyChart) OR A paper copy in the mail If you have any lab test that is abnormal or we need to change your treatment, we will call you to review the results.   Testing/Procedures: NONE ORDERED  TODAY     Follow-Up: At [facility], you and your health needs are our priority.  As part of our continuing mission to provide you with exceptional heart care, our providers are all part of one team.  This team includes your primary Cardiologist (physician) and Advanced Practice Providers or APPs (Physician Assistants and Nurse Practitioners) who all work together to provide you with the care you need, when you need it.   Your next appointment:    3 month(s)  ( CONTACT  CASSIE HALL/ ANGELINE HAMMER FOR EP SCHEDULING ISSUES )   Provider:    You may see Boyce Byes, MD or one of the following Advanced Practice Providers on your designated Care Team:   Mertha Abrahams, [place]   We recommend signing up for the patient portal called MyChart.  Sign up information is provided on this After Visit Summary.  MyChart is used to connect with patients for Virtual Visits (Telemedicine).  Patients are able to view lab/test results, encounter notes, upcoming appointments, etc.  Non-urgent messages can be sent to your provider as well.   To learn more about what you can do with MyChart, go to  [email or web address].   Other Instructions

## 2024-03-16 ENCOUNTER — Ambulatory Visit: Payer: Self-pay | Admitting: Cardiology

## 2024-03-16 LAB — TSH: TSH: 4.38 u[IU]/mL (ref 0.450–4.500)

## 2024-03-16 LAB — HEPATIC FUNCTION PANEL
ALT: 30 IU/L (ref 0–32)
AST: 37 IU/L (ref 0–40)
Albumin: 4.5 g/dL (ref 3.9–4.9)
Alkaline Phosphatase: 83 IU/L (ref 44–121)
Bilirubin Total: 0.3 mg/dL (ref 0.0–1.2)
Bilirubin, Direct: 0.11 mg/dL (ref 0.00–0.40)
Total Protein: 6.4 g/dL (ref 6.0–8.5)

## 2024-03-27 ENCOUNTER — Encounter (HOSPITAL_COMMUNITY): Payer: Self-pay | Admitting: Internal Medicine

## 2024-03-27 ENCOUNTER — Ambulatory Visit (HOSPITAL_COMMUNITY)
Admission: RE | Admit: 2024-03-27 | Discharge: 2024-03-27 | Disposition: A | Discharge status: Home / Self Care | Source: Ambulatory Visit | Attending: Internal Medicine | Admitting: Internal Medicine

## 2024-03-27 VITALS — BP 130/80 | HR 62 | Wt 123.8 lb

## 2024-03-27 DIAGNOSIS — I428 Other cardiomyopathies: Secondary | ICD-10-CM | POA: Insufficient documentation

## 2024-03-27 DIAGNOSIS — I480 Paroxysmal atrial fibrillation: Secondary | ICD-10-CM | POA: Insufficient documentation

## 2024-03-27 DIAGNOSIS — Z95810 Presence of automatic (implantable) cardiac defibrillator: Secondary | ICD-10-CM | POA: Insufficient documentation

## 2024-03-27 DIAGNOSIS — I080 Rheumatic disorders of both mitral and aortic valves: Secondary | ICD-10-CM | POA: Diagnosis not present

## 2024-03-27 DIAGNOSIS — E039 Hypothyroidism, unspecified: Secondary | ICD-10-CM | POA: Insufficient documentation

## 2024-03-27 DIAGNOSIS — Z7901 Long term (current) use of anticoagulants: Secondary | ICD-10-CM | POA: Diagnosis not present

## 2024-03-27 DIAGNOSIS — I5022 Chronic systolic (congestive) heart failure: Secondary | ICD-10-CM | POA: Diagnosis not present

## 2024-03-27 DIAGNOSIS — I493 Ventricular premature depolarization: Secondary | ICD-10-CM | POA: Insufficient documentation

## 2024-03-27 DIAGNOSIS — Z79899 Other long term (current) drug therapy: Secondary | ICD-10-CM | POA: Diagnosis not present

## 2024-03-27 DIAGNOSIS — I5042 Chronic combined systolic (congestive) and diastolic (congestive) heart failure: Secondary | ICD-10-CM | POA: Diagnosis not present

## 2024-03-27 MED ORDER — POTASSIUM CHLORIDE CRYS ER 20 MEQ PO TBCR: 20.0000 meq | EXTENDED_RELEASE_TABLET | Freq: Every day | ORAL | 3 refills | Status: DC | PRN

## 2024-03-27 MED ORDER — AMIODARONE HCL 100 MG PO TABS: 100.0000 mg | ORAL_TABLET | Freq: Every day | ORAL | 5 refills | Status: DC

## 2024-03-27 MED ORDER — FUROSEMIDE 20 MG PO TABS: 20.0000 mg | ORAL_TABLET | Freq: Every day | ORAL | 3 refills | Status: DC | PRN
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
## 2024-03-27 NOTE — Patient Instructions (Signed)
 Medication Changes:  DECREASE AMIODARONE  TO 100MG  ONCE DAILY   START: LASIX (FUROSEMIDE) 20MG  ONCE DAILY AS NEEDED FOR SWELLING OR WEIGHT GAIN    TAKE POTASSIUM ONCE DAILY WITH FUROSEMIDE   Follow-Up in: 4 MONTHS WITH AN ECHO PLEASE CALL OUR OFFICE AROUND AUGUST  TO GET SCHEDULED FOR YOUR APPOINTMENT. PHONE NUMBER IS [phone] OPTION 2   At the [facility], you and your health needs are our priority. We have a designated team specialized in the treatment of Heart Failure. This Care Team includes your primary Heart Failure Specialized Cardiologist (physician), Advanced Practice Providers (APPs- Physician Assistants and Nurse Practitioners), and Pharmacist who all work together to provide you with the care you need, when you need it.   You may see any of the following providers on your designated Care Team at your next follow up:  Dr. Toribio Fuel Dr. Ezra Shuck Dr. Ria Commander Dr. Odis Brownie Greig Mosses, NP Caffie Shed, [place] [facility] [place], [place] Beckey Coe, NP [place] Lee, NP Tinnie Redman, PharmD   Please be sure to bring in all your medications bottles to every appointment.   Need to Contact Us :  If you have any questions or concerns before your next appointment please send us  a message through [place] or call our office at [phone].    TO LEAVE A MESSAGE FOR THE NURSE SELECT OPTION 2, PLEASE LEAVE A MESSAGE INCLUDING: YOUR NAME DATE OF BIRTH CALL BACK NUMBER REASON FOR CALL**this is important as we prioritize the call backs  YOU WILL RECEIVE A CALL BACK THE SAME DAY AS LONG AS YOU CALL BEFORE 4:00 PM

## 2024-04-07 ENCOUNTER — Encounter: Payer: Self-pay | Admitting: Cardiology

## 2024-04-07 DIAGNOSIS — I480 Paroxysmal atrial fibrillation: Secondary | ICD-10-CM

## 2024-04-12 ENCOUNTER — Encounter: Payer: Self-pay | Admitting: Cardiology

## 2024-04-15 ENCOUNTER — Encounter: Payer: Self-pay | Admitting: Cardiology

## 2024-04-16 MED ORDER — RIVAROXABAN 20 MG PO TABS: 20.0000 mg | ORAL_TABLET | Freq: Every day | ORAL | 5 refills | Status: DC

## 2024-04-16 NOTE — Addendum Note (Signed)
 Addended by: DARRELL BRUCKNER on: 04/16/2024 08:18 AM   Modules accepted: Orders

## 2024-04-25 ENCOUNTER — Ambulatory Visit

## 2024-04-25 DIAGNOSIS — I5022 Chronic systolic (congestive) heart failure: Secondary | ICD-10-CM | POA: Diagnosis not present

## 2024-04-26 LAB — CUP PACEART REMOTE DEVICE CHECK
Battery Remaining Longevity: 145 mo
Battery Voltage: 3.09 V
Brady Statistic RV Percent Paced: 0.04 %
Date Time Interrogation Session: 20250723203933
HighPow Impedance: 65 Ohm
Implantable Lead Connection Status: 753985
Implantable Lead Connection Status: 753985
Implantable Lead Implant Date: 20250313
Implantable Lead Implant Date: 20250313
Implantable Lead Location: 753859
Implantable Lead Location: 753860
Implantable Lead Model: 5076
Implantable Pulse Generator Implant Date: 20250313
Lead Channel Impedance Value: 228 Ohm
Lead Channel Impedance Value: 304 Ohm
Lead Channel Impedance Value: 475 Ohm
Lead Channel Pacing Threshold Amplitude: 0.375 V
Lead Channel Pacing Threshold Amplitude: 1.5 V
Lead Channel Pacing Threshold Pulse Width: 0.4 ms
Lead Channel Pacing Threshold Pulse Width: 0.4 ms
Lead Channel Sensing Intrinsic Amplitude: 1.5 mV
Lead Channel Sensing Intrinsic Amplitude: 8.9 mV
Lead Channel Setting Pacing Amplitude: 1.5 V
Lead Channel Setting Pacing Amplitude: 2.25 V
Lead Channel Setting Pacing Pulse Width: 0.4 ms
Lead Channel Setting Sensing Sensitivity: 0.3 mV
Zone Setting Status: 755011
Zone Setting Status: 755011
Zone Setting Status: 755011

## 2024-05-02 ENCOUNTER — Ambulatory Visit: Payer: Self-pay | Admitting: Cardiology

## 2024-05-28 NOTE — H&P (View-Only) (Signed)
  Electrophysiology Office Follow up Visit Note:    Date:  05/29/2024   ID:  Janet Ramirez, DOB 1961/09/08, MRN [id number]  PCP:  Crecencio Chiquita POUR, FNP  [facility] Cardiologist:  None  [facility] Electrophysiologist:  OLE ONEIDA HOLTS, MD    Interval History:     Janet Ramirez is a 63 y.o. female who presents for a follow up visit.   The patient Janet Ramirez and the January 19, 2024.  She has an extensive past medical history that includes atrial fibrillation, chronic systolic heart failure.  She has an ICD in situ.  She has had very symptomatic atrial fibrillation despite treatment with amiodarone .  Recent device interrogation shows 0% burden of atrial fibrillation      Past medical, surgical, social and family history were reviewed.  ROS:   Please see the history of present illness.    All other systems reviewed and are negative.  EKGs/Labs/Other Studies Reviewed:    The following studies were reviewed today:  May 29, 2024 in-clinic device interrogation personally reviewed Battery and lead parameter stable.  0% A-fib burden over the last few weeks. No programming changes made today        Physical Exam:    VS:  BP 137/83   Pulse 62   Ht 5' 4 (1.626 m)   Wt 124 lb (56.2 kg)   SpO2 100%   BMI 21.28 kg/m     Wt Readings from Last 3 Encounters:  05/29/24 124 lb (56.2 kg)  03/27/24 123 lb 12.8 oz (56.2 kg)  03/15/24 123 lb (55.8 kg)     GEN: no distress CARD: RRR, No MRG.  ICD pocket well-healed RESP: No IWOB. CTAB.      ASSESSMENT:    1. Chronic systolic heart failure (HCC)   2. Paroxysmal atrial fibrillation (HCC)   3. NICM (nonischemic cardiomyopathy) (HCC)   4. Cardiac defibrillator in situ    PLAN:    In order of problems listed above:  #Atrial fibrillation #High risk drug monitoring-amiodarone  The patient is on amiodarone  for rhythm control given highly symptomatic atrial fibrillation.  Recent device interrogation shows 0%  A-fib.  She is currently scheduled for catheter ablation in September.  I reviewed the ablation procedure in detail.  Discussed treatment options today for AF including antiarrhythmic drug therapy and ablation. Discussed risks, recovery and likelihood of success with each treatment strategy. Risk, benefits, and alternatives to EP study and ablation for afib were discussed. These risks include but are not limited to stroke, bleeding, vascular damage, tamponade, perforation, damage to the esophagus, lungs, phrenic nerve and other structures, pulmonary vein stenosis, worsening renal function, coronary vasospasm and death.  Discussed potential need for repeat ablation procedures and antiarrhythmic drugs after an initial ablation. The patient understands these risk and wishes to proceed.  We will therefore proceed with catheter ablation at the next available time.  Carto, ICE, anesthesia are requested for the procedure.    Continue amiodarone  100 mg by mouth once daily Continue Xarelto   #Chronic systolic heart failure #ICD in situ Follows with heart failure clinic.  NYHA class II today.  Warm dry on exam.  Continue GDMT.  Signed, OLE HOLTS, MD, [facility], [facility] 05/29/2024 3:04 PM    Electrophysiology [facility]

## 2024-05-28 NOTE — Progress Notes (Unsigned)
  Electrophysiology Office Follow up Visit Note:    Date:  05/29/2024   ID:  Janet Ramirez, DOB 06/20/1961, MRN [id number]  PCP:  Crecencio Chiquita POUR, FNP  [facility] Cardiologist:  None  [facility] Electrophysiologist:  OLE ONEIDA HOLTS, MD    Interval History:     Janet Ramirez is a 63 y.o. female who presents for a follow up visit.   The patient Lycelle and the January 19, 2024.  She has an extensive past medical history that includes atrial fibrillation, chronic systolic heart failure.  She has an ICD in situ.  She has had very symptomatic atrial fibrillation despite treatment with amiodarone .  Recent device interrogation shows 0% burden of atrial fibrillation      Past medical, surgical, social and family history were reviewed.  ROS:   Please see the history of present illness.    All other systems reviewed and are negative.  EKGs/Labs/Other Studies Reviewed:    The following studies were reviewed today:  May 29, 2024 in-clinic device interrogation personally reviewed Battery and lead parameter stable.  0% A-fib burden over the last few weeks. No programming changes made today        Physical Exam:    VS:  BP 137/83   Pulse 62   Ht 5' 4 (1.626 m)   Wt 124 lb (56.2 kg)   SpO2 100%   BMI 21.28 kg/m     Wt Readings from Last 3 Encounters:  05/29/24 124 lb (56.2 kg)  03/27/24 123 lb 12.8 oz (56.2 kg)  03/15/24 123 lb (55.8 kg)     GEN: no distress CARD: RRR, No MRG.  ICD pocket well-healed RESP: No IWOB. CTAB.      ASSESSMENT:    1. Chronic systolic heart failure (HCC)   2. Paroxysmal atrial fibrillation (HCC)   3. NICM (nonischemic cardiomyopathy) (HCC)   4. Cardiac defibrillator in situ    PLAN:    In order of problems listed above:  #Atrial fibrillation #High risk drug monitoring-amiodarone  The patient is on amiodarone  for rhythm control given highly symptomatic atrial fibrillation.  Recent device interrogation shows 0%  A-fib.  She is currently scheduled for catheter ablation in September.  I reviewed the ablation procedure in detail.  Discussed treatment options today for AF including antiarrhythmic drug therapy and ablation. Discussed risks, recovery and likelihood of success with each treatment strategy. Risk, benefits, and alternatives to EP study and ablation for afib were discussed. These risks include but are not limited to stroke, bleeding, vascular damage, tamponade, perforation, damage to the esophagus, lungs, phrenic nerve and other structures, pulmonary vein stenosis, worsening renal function, coronary vasospasm and death.  Discussed potential need for repeat ablation procedures and antiarrhythmic drugs after an initial ablation. The patient understands these risk and wishes to proceed.  We will therefore proceed with catheter ablation at the next available time.  Carto, ICE, anesthesia are requested for the procedure.    Continue amiodarone  100 mg by mouth once daily Continue Xarelto   #Chronic systolic heart failure #ICD in situ Follows with heart failure clinic.  NYHA class II today.  Warm dry on exam.  Continue GDMT.  Signed, OLE HOLTS, MD, [facility], [facility] 05/29/2024 3:04 PM    Electrophysiology [facility]

## 2024-05-29 ENCOUNTER — Encounter: Payer: Self-pay | Admitting: Cardiology

## 2024-05-29 ENCOUNTER — Ambulatory Visit: Attending: Cardiology | Admitting: Cardiology

## 2024-05-29 ENCOUNTER — Other Ambulatory Visit: Payer: Self-pay

## 2024-05-29 VITALS — BP 137/83 | HR 62 | Ht 64.0 in | Wt 124.0 lb

## 2024-05-29 DIAGNOSIS — I480 Paroxysmal atrial fibrillation: Secondary | ICD-10-CM | POA: Diagnosis not present

## 2024-05-29 DIAGNOSIS — Z95810 Presence of automatic (implantable) cardiac defibrillator: Secondary | ICD-10-CM | POA: Diagnosis not present

## 2024-05-29 DIAGNOSIS — I5022 Chronic systolic (congestive) heart failure: Secondary | ICD-10-CM

## 2024-05-29 DIAGNOSIS — I428 Other cardiomyopathies: Secondary | ICD-10-CM

## 2024-05-29 NOTE — Patient Instructions (Signed)
 Medication Instructions:  Your physician recommends that you continue on your current medications as directed. Please refer to the Current Medication list given to you today.  *If you need a refill on your cardiac medications before your next appointment, please call your pharmacy*  Lab Work: TODAY: BMET and CBC  Testing/Procedures: Ablation  Your physician has recommended that you have an ablation. Catheter ablation is a medical procedure used to treat some cardiac arrhythmias (irregular heartbeats). During catheter ablation, a long, thin, flexible tube is put into a blood vessel in your groin (upper thigh), or neck. This tube is called an ablation catheter. It is then guided to your heart through the blood vessel. Radio frequency waves destroy small areas of heart tissue where abnormal heartbeats may cause an arrhythmia to start. Please see the instruction sheet given to you today.   Follow-Up: At [facility], you and your health needs are our priority.  As part of our continuing mission to provide you with exceptional heart care, our providers are all part of one team.  This team includes your primary Cardiologist (physician) and Advanced Practice Providers or APPs (Physician Assistants and Nurse Practitioners) who all work together to provide you with the care you need, when you need it.  Your next appointment:   We will contact you to schedule your post-procedure appointments

## 2024-05-30 LAB — CBC
Hematocrit: 43.3 % (ref 34.0–46.6)
Hemoglobin: 14.1 g/dL (ref 11.1–15.9)
MCH: 30.9 pg (ref 26.6–33.0)
MCHC: 32.6 g/dL (ref 31.5–35.7)
MCV: 95 fL (ref 79–97)
Platelets: 260 x10E3/uL (ref 150–450)
RBC: 4.56 x10E6/uL (ref 3.77–5.28)
RDW: 13.2 % (ref 11.7–15.4)
WBC: 7.4 x10E3/uL (ref 3.4–10.8)

## 2024-05-30 LAB — BASIC METABOLIC PANEL WITH GFR
BUN/Creatinine Ratio: 19 (ref 12–28)
BUN: 22 mg/dL (ref 8–27)
CO2: 24 mmol/L (ref 20–29)
Calcium: 9.5 mg/dL (ref 8.7–10.3)
Chloride: 102 mmol/L (ref 96–106)
Creatinine, Ser: 1.14 mg/dL — ABNORMAL HIGH (ref 0.57–1.00)
Glucose: 88 mg/dL (ref 70–99)
Potassium: 4.3 mmol/L (ref 3.5–5.2)
Sodium: 140 mmol/L (ref 134–144)
eGFR: 54 mL/min/1.73 — ABNORMAL LOW (ref >59)

## 2024-06-06 ENCOUNTER — Encounter: Payer: Self-pay | Admitting: Cardiology

## 2024-06-07 ENCOUNTER — Encounter (HOSPITAL_COMMUNITY): Payer: Self-pay | Admitting: Internal Medicine

## 2024-06-07 NOTE — Telephone Encounter (Signed)
 She could try PRN torsemide  instead of PRN furosemide . However, these are both loop diuretics, so she may not have less headaches after switching. She has already tried and failed spironolactone  multiple times with HOTN and headaches. It does appear headaches are a common problem for her and has lead to issues tolerating multiple medications. If she has not already discussed this with her PCP, I would encourage her to do so.

## 2024-06-12 MED ORDER — TORSEMIDE 20 MG PO TABS
20.0000 mg | ORAL_TABLET | Freq: Every day | ORAL | 3 refills | Status: AC | PRN
Start: 2024-06-12 — End: 2024-09-10

## 2024-06-12 MED ORDER — POTASSIUM CHLORIDE CRYS ER 20 MEQ PO TBCR: 20.0000 meq | EXTENDED_RELEASE_TABLET | Freq: Every day | ORAL | 3 refills | Status: AC | PRN

## 2024-06-13 DIAGNOSIS — I10 Essential (primary) hypertension: Secondary | ICD-10-CM | POA: Diagnosis not present

## 2024-06-13 DIAGNOSIS — R519 Headache, unspecified: Secondary | ICD-10-CM | POA: Diagnosis not present

## 2024-06-13 DIAGNOSIS — Z13220 Encounter for screening for lipoid disorders: Secondary | ICD-10-CM | POA: Diagnosis not present

## 2024-06-13 DIAGNOSIS — Z0000 Encounter for general adult medical examination without abnormal findings: Secondary | ICD-10-CM | POA: Diagnosis not present

## 2024-06-13 DIAGNOSIS — Z131 Encounter for screening for diabetes mellitus: Secondary | ICD-10-CM | POA: Diagnosis not present

## 2024-06-24 ENCOUNTER — Other Ambulatory Visit: Payer: Self-pay

## 2024-06-25 MED ORDER — METOPROLOL SUCCINATE ER 25 MG PO TB24: 25.0000 mg | ORAL_TABLET | Freq: Every day | ORAL | 3 refills | Status: AC

## 2024-06-25 NOTE — Pre-Procedure Instructions (Signed)
 Instructed patient on the following items: Arrival time 0530 Nothing to eat or drink after midnight No meds AM of procedure Responsible person to drive you home and stay with you for 24 hrs  Have you missed any doses of anti-coagulant Xarelto -  takes once a day,  hasn't missed any doses in last 4 weeks.

## 2024-06-26 ENCOUNTER — Other Ambulatory Visit (HOSPITAL_COMMUNITY): Payer: Self-pay

## 2024-06-26 ENCOUNTER — Ambulatory Visit (HOSPITAL_COMMUNITY): Payer: Self-pay | Admitting: Certified Registered"

## 2024-06-26 ENCOUNTER — Other Ambulatory Visit: Payer: Self-pay

## 2024-06-26 ENCOUNTER — Ambulatory Visit (HOSPITAL_COMMUNITY)
Admission: RE | Disposition: A | Discharge status: Home / Self Care | Payer: Self-pay | Source: Home / Self Care | Attending: Cardiology

## 2024-06-26 ENCOUNTER — Ambulatory Visit (HOSPITAL_COMMUNITY)
Admission: RE | Admit: 2024-06-26 | Discharge: 2024-06-26 | Disposition: A | Discharge status: Home / Self Care | Attending: Cardiology | Admitting: Cardiology

## 2024-06-26 DIAGNOSIS — Z7901 Long term (current) use of anticoagulants: Secondary | ICD-10-CM | POA: Insufficient documentation

## 2024-06-26 DIAGNOSIS — Z79899 Other long term (current) drug therapy: Secondary | ICD-10-CM | POA: Diagnosis not present

## 2024-06-26 DIAGNOSIS — I428 Other cardiomyopathies: Secondary | ICD-10-CM | POA: Insufficient documentation

## 2024-06-26 DIAGNOSIS — I4819 Other persistent atrial fibrillation: Secondary | ICD-10-CM | POA: Diagnosis not present

## 2024-06-26 DIAGNOSIS — I4891 Unspecified atrial fibrillation: Secondary | ICD-10-CM | POA: Diagnosis not present

## 2024-06-26 DIAGNOSIS — I5022 Chronic systolic (congestive) heart failure: Secondary | ICD-10-CM | POA: Insufficient documentation

## 2024-06-26 DIAGNOSIS — Z95810 Presence of automatic (implantable) cardiac defibrillator: Secondary | ICD-10-CM | POA: Insufficient documentation

## 2024-06-26 DIAGNOSIS — I5021 Acute systolic (congestive) heart failure: Secondary | ICD-10-CM | POA: Diagnosis not present

## 2024-06-26 HISTORY — PX: ATRIAL FIBRILLATION ABLATION: EP1191

## 2024-06-26 LAB — POCT ACTIVATED CLOTTING TIME: Activated Clotting Time: 291 s

## 2024-06-26 SURGERY — ATRIAL FIBRILLATION ABLATION
Anesthesia: General

## 2024-06-26 MED ORDER — PANTOPRAZOLE SODIUM 40 MG PO TBEC
40.0000 mg | DELAYED_RELEASE_TABLET | Freq: Every day | ORAL | 0 refills | Status: AC
  Filled 2024-06-26: qty 45, 45d supply, fill #0

## 2024-06-26 MED ORDER — ACETAMINOPHEN 325 MG PO TABS
ORAL_TABLET | ORAL | Status: AC
  Filled 2024-06-26: qty 2

## 2024-06-26 MED ORDER — DEXAMETHASONE SODIUM PHOSPHATE 10 MG/ML IJ SOLN
INTRAMUSCULAR | Status: DC | PRN
  Administered 2024-06-26: 10 mg via INTRAVENOUS

## 2024-06-26 MED ORDER — SUGAMMADEX SODIUM 200 MG/2ML IV SOLN
INTRAVENOUS | Status: DC | PRN
  Administered 2024-06-26: 1150 mg via INTRAVENOUS

## 2024-06-26 MED ORDER — SODIUM CHLORIDE 0.9 % IV SOLN: 250.0000 mL | INTRAVENOUS | Status: DC | PRN

## 2024-06-26 MED ORDER — HEPARIN (PORCINE) IN NACL 1000-0.9 UT/500ML-% IV SOLN
INTRAVENOUS | Status: DC | PRN
  Administered 2024-06-26 (×3): 500 mL

## 2024-06-26 MED ORDER — CEFAZOLIN SODIUM-DEXTROSE 2-3 GM-%(50ML) IV SOLR
INTRAVENOUS | Status: DC | PRN
  Administered 2024-06-26: 2 g via INTRAVENOUS

## 2024-06-26 MED ORDER — PANTOPRAZOLE SODIUM 40 MG PO TBEC
40.0000 mg | DELAYED_RELEASE_TABLET | Freq: Every day | ORAL | Status: DC
  Administered 2024-06-26: 40 mg via ORAL
  Filled 2024-06-26: qty 1

## 2024-06-26 MED ORDER — SODIUM CHLORIDE 0.9% FLUSH: 3.0000 mL | Freq: Two times a day (BID) | INTRAVENOUS | Status: DC

## 2024-06-26 MED ORDER — FENTANYL CITRATE (PF) 250 MCG/5ML IJ SOLN
INTRAMUSCULAR | Status: DC | PRN
  Administered 2024-06-26: 75 ug via INTRAVENOUS
  Administered 2024-06-26: 25 ug via INTRAVENOUS

## 2024-06-26 MED ORDER — PHENYLEPHRINE HCL-NACL 20-0.9 MG/250ML-% IV SOLN
INTRAVENOUS | Status: DC | PRN
  Administered 2024-06-26: 25 ug/min via INTRAVENOUS

## 2024-06-26 MED ORDER — PROPOFOL 10 MG/ML IV BOLUS
INTRAVENOUS | Status: DC | PRN
  Administered 2024-06-26: 70 mg via INTRAVENOUS
  Administered 2024-06-26: 20 mg via INTRAVENOUS

## 2024-06-26 MED ORDER — CEFAZOLIN SODIUM-DEXTROSE 2-4 GM/100ML-% IV SOLN
INTRAVENOUS | Status: AC
  Filled 2024-06-26: qty 100

## 2024-06-26 MED ORDER — ACETAMINOPHEN 325 MG PO TABS
650.0000 mg | ORAL_TABLET | ORAL | Status: DC | PRN
  Administered 2024-06-26: 650 mg via ORAL

## 2024-06-26 MED ORDER — ROCURONIUM BROMIDE 10 MG/ML (PF) SYRINGE
PREFILLED_SYRINGE | INTRAVENOUS | Status: DC | PRN
  Administered 2024-06-26: 40 mg via INTRAVENOUS
  Administered 2024-06-26: 10 mg via INTRAVENOUS
  Administered 2024-06-26: 20 mg via INTRAVENOUS
  Administered 2024-06-26: 10 mg via INTRAVENOUS

## 2024-06-26 MED ORDER — PROTAMINE SULFATE 10 MG/ML IV SOLN
INTRAVENOUS | Status: DC | PRN
  Administered 2024-06-26: 35 mg via INTRAVENOUS

## 2024-06-26 MED ORDER — COLCHICINE 0.6 MG PO TABS
0.6000 mg | ORAL_TABLET | Freq: Two times a day (BID) | ORAL | Status: DC
  Administered 2024-06-26: 0.6 mg via ORAL
  Filled 2024-06-26: qty 1

## 2024-06-26 MED ORDER — PROPOFOL 500 MG/50ML IV EMUL
INTRAVENOUS | Status: DC | PRN
  Administered 2024-06-26: 150 ug/kg/min via INTRAVENOUS

## 2024-06-26 MED ORDER — COLCHICINE 0.6 MG PO TABS
0.6000 mg | ORAL_TABLET | Freq: Two times a day (BID) | ORAL | 0 refills | Status: DC
  Filled 2024-06-26: qty 10, 5d supply, fill #0

## 2024-06-26 MED ORDER — ONDANSETRON HCL 4 MG/2ML IJ SOLN
INTRAMUSCULAR | Status: DC | PRN
  Administered 2024-06-26: 4 mg via INTRAVENOUS

## 2024-06-26 MED ORDER — HEPARIN SODIUM (PORCINE) 1000 UNIT/ML IJ SOLN
INTRAMUSCULAR | Status: DC | PRN
  Administered 2024-06-26: 8000 [IU] via INTRAVENOUS
  Administered 2024-06-26: 3000 [IU] via INTRAVENOUS

## 2024-06-26 MED ORDER — LIDOCAINE 2% (20 MG/ML) 5 ML SYRINGE
INTRAMUSCULAR | Status: DC | PRN
  Administered 2024-06-26: 40 mg via INTRAVENOUS

## 2024-06-26 MED ORDER — SODIUM CHLORIDE 0.9% FLUSH: 3.0000 mL | INTRAVENOUS | Status: DC | PRN

## 2024-06-26 MED ORDER — SODIUM CHLORIDE 0.9 % IV SOLN: INTRAVENOUS | Status: DC

## 2024-06-26 MED ORDER — ATROPINE SULFATE 1 MG/10ML IJ SOSY
PREFILLED_SYRINGE | INTRAMUSCULAR | Status: DC | PRN
  Administered 2024-06-26: 1 mg via INTRAVENOUS

## 2024-06-26 MED ORDER — ONDANSETRON HCL 4 MG/2ML IJ SOLN: 4.0000 mg | Freq: Four times a day (QID) | INTRAMUSCULAR | Status: DC | PRN

## 2024-06-26 MED ORDER — PHENYLEPHRINE 80 MCG/ML (10ML) SYRINGE FOR IV PUSH (FOR BLOOD PRESSURE SUPPORT)
PREFILLED_SYRINGE | INTRAVENOUS | Status: DC | PRN
  Administered 2024-06-26 (×2): 40 ug via INTRAVENOUS

## 2024-06-26 MED ORDER — FENTANYL CITRATE (PF) 100 MCG/2ML IJ SOLN
INTRAMUSCULAR | Status: AC
  Filled 2024-06-26: qty 2

## 2024-06-26 MED ORDER — ATROPINE SULFATE 1 MG/10ML IJ SOSY
PREFILLED_SYRINGE | INTRAMUSCULAR | Status: AC
  Filled 2024-06-26: qty 10

## 2024-06-26 MED ORDER — HEPARIN SODIUM (PORCINE) 1000 UNIT/ML IJ SOLN
INTRAMUSCULAR | Status: AC
  Filled 2024-06-26: qty 20

## 2024-06-26 SURGICAL SUPPLY — 20 items
BAG SNAP BAND KOVER 36X36 (MISCELLANEOUS) IMPLANT
BLANKET WARM UNDERBOD FULL ACC (MISCELLANEOUS) ×1 IMPLANT
CABLE FARASTAR GEN2 SNGL USE (CABLE) IMPLANT
CATH FARAWAVE 2.0 31 (CATHETERS) IMPLANT
CATH GE 8FR SOUNDSTAR (CATHETERS) IMPLANT
CATH OCTARAY 2.0 F 3-3-3-3-3 (CATHETERS) IMPLANT
CATH WEBSTER BI DIR CS D-F CRV (CATHETERS) IMPLANT
CLOSURE PERCLOSE PROSTYLE (Vascular Products) IMPLANT
COVER SWIFTLINK CONNECTOR (BAG) ×1 IMPLANT
DILATOR VESSEL 38 20CM 16FR (INTRODUCER) IMPLANT
GUIDEWIRE INQWIRE 1.5J.035X260 (WIRE) IMPLANT
KIT VERSACROSS CNCT FARADRIVE (KITS) IMPLANT
PACK EP LF (CUSTOM PROCEDURE TRAY) ×1 IMPLANT
PAD DEFIB RADIO PHYSIO CONN (PAD) ×1 IMPLANT
PATCH CARTO3 (PAD) IMPLANT
SHEATH FARADRIVE STEERABLE (SHEATH) IMPLANT
SHEATH PINNACLE 8F 10CM (SHEATH) IMPLANT
SHEATH PINNACLE 9F 10CM (SHEATH) IMPLANT
SHEATH PROBE COVER 6X72 (BAG) IMPLANT
WIRE HI TORQ VERSACORE-J 145CM (WIRE) IMPLANT

## 2024-06-26 NOTE — Interval H&P Note (Signed)
 History and Physical Interval Note:  06/26/2024 7:15 AM  Janet Ramirez  has presented today for surgery, with the diagnosis of afib.  The various methods of treatment have been discussed with the patient and family. After consideration of risks, benefits and other options for treatment, the patient has consented to  Procedure(s): ATRIAL FIBRILLATION ABLATION (N/A) as a surgical intervention.  The patient's history has been reviewed, patient examined, no change in status, stable for surgery.  I have reviewed the patient's chart and labs.  Questions were answered to the patient's satisfaction.     Jaret Coppedge T Pinki Rottman

## 2024-06-26 NOTE — Anesthesia Procedure Notes (Signed)
 Procedure Name: Intubation Date/Time: 06/26/2024 7:50 AM  Performed by: Delores Dus, CRNAPre-anesthesia Checklist: Patient identified, Emergency Drugs available, Suction available and Patient being monitored Patient Re-evaluated:Patient Re-evaluated prior to induction Oxygen Delivery Method: Circle system utilized Preoxygenation: Pre-oxygenation with 100% oxygen Induction Type: IV induction Ventilation: Mask ventilation without difficulty Laryngoscope Size: 2 and Miller Grade View: Grade I Tube type: Oral Tube size: 7.0 mm Number of attempts: 1 Airway Equipment and Method: Stylet and Oral airway Placement Confirmation: ETT inserted through vocal cords under direct vision, positive ETCO2 and breath sounds checked- equal and bilateral Secured at: 20 cm Tube secured with: Tape Dental Injury: Teeth and Oropharynx as per pre-operative assessment

## 2024-06-26 NOTE — Transfer of Care (Signed)
 Immediate Anesthesia Transfer of Care Note  Patient: Janet Ramirez  Procedure(s) Performed: ATRIAL FIBRILLATION ABLATION  Patient Location: PACU  Anesthesia Type:General  Level of Consciousness: awake, alert , and oriented  Airway & Oxygen Therapy: Patient connected to nasal cannula oxygen  Post-op Assessment: Report given to RN and Post -op Vital signs reviewed and stable  Post vital signs: Reviewed and stable  Last Vitals:  Vitals Value Taken Time  BP 115/57 06/26/24 09:20  Temp    Pulse 66 06/26/24 09:23  Resp 14 06/26/24 09:23  SpO2 100 % 06/26/24 09:23  Vitals shown include unfiled device data.  Last Pain:  Vitals:   06/26/24 0602  TempSrc:   PainSc: 0-No pain         Complications: No notable events documented.

## 2024-06-26 NOTE — Progress Notes (Signed)
 Patient and family given discharge instructions, no further questions at this time.  Patient given medications, spouse has medications with him. Patient complain of brown colored urine, RN unable to visualize, PA Blanca made aware, states it is okay for patient to discharge at this time. Patient educated to monitor for urine discoloration and call primary if this continues.

## 2024-06-26 NOTE — Anesthesia Preprocedure Evaluation (Signed)
 Anesthesia Evaluation  Patient identified by MRN, date of birth, ID band Patient awake    Reviewed: Allergy & Precautions, NPO status , Patient's Chart, lab work & pertinent test results  History of Anesthesia Complications Negative for: history of anesthetic complications  Airway Mallampati: II  TM Distance: >3 FB Neck ROM: Full    Dental  (+) Dental Advisory Given, Teeth Intact   Pulmonary neg shortness of breath, neg sleep apnea, neg COPD, neg recent URI   breath sounds clear to auscultation       Cardiovascular +CHF  (-) CAD and (-) Past MI + dysrhythmias Atrial Fibrillation  Rhythm:Regular  1. Left ventricular ejection fraction, by estimation, is 30 to 35%. Left  ventricular ejection fraction by 3D volume is 33 %. The left ventricle has  moderately decreased function. The left ventricle demonstrates global  hypokinesis. The left ventricular  internal cavity size was moderately dilated. Left ventricular diastolic  parameters are indeterminate.   2. Right ventricular systolic function is normal. The right ventricular  size is normal. There is mildly elevated pulmonary artery systolic  pressure. The estimated right ventricular systolic pressure is 36.9 mmHg.   3. Left atrial size was severely dilated.   4. Right atrial size was mildly dilated.   5. The mitral valve is abnormal. Mild to moderate mitral valve  regurgitation. No evidence of mitral stenosis.   6. The aortic valve is normal in structure. Aortic valve regurgitation is  mild. No aortic stenosis is present.   7. The inferior vena cava is normal in size with <50% respiratory  variability, suggesting right atrial pressure of 8 mmHg.     Neuro/Psych negative neurological ROS     GI/Hepatic negative GI ROS, Neg liver ROS,,,  Endo/Other  negative endocrine ROS    Renal/GU Lab Results      Component                Value               Date                      NA                        140                 05/29/2024                K                        4.3                 05/29/2024                CO2                      24                  05/29/2024                GLUCOSE                  88                  05/29/2024                BUN  22                  05/29/2024                CREATININE               1.14 (H)            05/29/2024                CALCIUM                   9.5                 05/29/2024                EGFR                     54 (L)              05/29/2024                GFRNONAA                 >60                 12/19/2023                Musculoskeletal negative musculoskeletal ROS (+)    Abdominal   Peds  Hematology Lab Results      Component                Value               Date                      WBC                      7.4                 05/29/2024                HGB                      14.1                05/29/2024                HCT                      43.3                05/29/2024                MCV                      95                  05/29/2024                PLT                      260                 05/29/2024              Anesthesia Other Findings   Reproductive/Obstetrics  Anesthesia Physical Anesthesia Plan  ASA: 3  Anesthesia Plan: General   Post-op Pain Management: Minimal or no pain anticipated   Induction: Intravenous  PONV Risk Score and Plan: 3 and Propofol  infusion, Ondansetron  and Dexamethasone   Airway Management Planned: Oral ETT  Additional Equipment: None  Intra-op Plan:   Post-operative Plan: Extubation in OR  Informed Consent: I have reviewed the patients History and Physical, chart, labs and discussed the procedure including the risks, benefits and alternatives for the proposed anesthesia with the patient or authorized representative who has indicated his/her understanding and  acceptance.     Dental advisory given  Plan Discussed with: CRNA  Anesthesia Plan Comments:          Anesthesia Quick Evaluation

## 2024-06-27 ENCOUNTER — Encounter (HOSPITAL_COMMUNITY): Payer: Self-pay | Admitting: Cardiology

## 2024-06-27 MED FILL — Fentanyl Citrate Preservative Free (PF) Inj 100 MCG/2ML: INTRAMUSCULAR | Qty: 2 | Status: AC

## 2024-06-27 MED FILL — Cefazolin Sodium-Dextrose IV Solution 2 GM/100ML-4%: INTRAVENOUS | Qty: 100 | Status: AC

## 2024-06-27 NOTE — Anesthesia Postprocedure Evaluation (Signed)
 Anesthesia Post Note  Patient: Janet Ramirez  Procedure(s) Performed: ATRIAL FIBRILLATION ABLATION     Patient location during evaluation: Cath Lab Anesthesia Type: General Level of consciousness: awake and alert Pain management: pain level controlled Vital Signs Assessment: post-procedure vital signs reviewed and stable Respiratory status: spontaneous breathing, nonlabored ventilation and respiratory function stable Cardiovascular status: blood pressure returned to baseline and stable Postop Assessment: no apparent nausea or vomiting Anesthetic complications: no   No notable events documented.                  Tyller Bowlby

## 2024-07-04 NOTE — Progress Notes (Signed)
 Remote ICD Transmission

## 2024-07-10 ENCOUNTER — Encounter: Payer: Self-pay | Admitting: Cardiology

## 2024-07-18 ENCOUNTER — Encounter (HOSPITAL_COMMUNITY): Payer: Self-pay | Admitting: Internal Medicine

## 2024-07-18 ENCOUNTER — Ambulatory Visit (HOSPITAL_BASED_OUTPATIENT_CLINIC_OR_DEPARTMENT_OTHER)
Admission: RE | Admit: 2024-07-18 | Discharge: 2024-07-18 | Disposition: A | Discharge status: Home / Self Care | Source: Ambulatory Visit | Attending: Internal Medicine | Admitting: Internal Medicine

## 2024-07-18 ENCOUNTER — Ambulatory Visit (HOSPITAL_COMMUNITY)
Admission: RE | Admit: 2024-07-18 | Discharge: 2024-07-18 | Disposition: A | Discharge status: Home / Self Care | Source: Ambulatory Visit | Attending: Internal Medicine | Admitting: Internal Medicine

## 2024-07-18 VITALS — BP 144/86 | HR 63 | Ht 64.0 in | Wt 126.0 lb

## 2024-07-18 DIAGNOSIS — I480 Paroxysmal atrial fibrillation: Secondary | ICD-10-CM

## 2024-07-18 DIAGNOSIS — I493 Ventricular premature depolarization: Secondary | ICD-10-CM

## 2024-07-18 DIAGNOSIS — Z7901 Long term (current) use of anticoagulants: Secondary | ICD-10-CM | POA: Insufficient documentation

## 2024-07-18 DIAGNOSIS — R0602 Shortness of breath: Secondary | ICD-10-CM | POA: Insufficient documentation

## 2024-07-18 DIAGNOSIS — E039 Hypothyroidism, unspecified: Secondary | ICD-10-CM | POA: Insufficient documentation

## 2024-07-18 DIAGNOSIS — I5022 Chronic systolic (congestive) heart failure: Secondary | ICD-10-CM

## 2024-07-18 DIAGNOSIS — I428 Other cardiomyopathies: Secondary | ICD-10-CM | POA: Diagnosis not present

## 2024-07-18 DIAGNOSIS — Z79899 Other long term (current) drug therapy: Secondary | ICD-10-CM | POA: Diagnosis not present

## 2024-07-18 DIAGNOSIS — I080 Rheumatic disorders of both mitral and aortic valves: Secondary | ICD-10-CM | POA: Insufficient documentation

## 2024-07-18 DIAGNOSIS — Z95810 Presence of automatic (implantable) cardiac defibrillator: Secondary | ICD-10-CM

## 2024-07-18 DIAGNOSIS — I5042 Chronic combined systolic (congestive) and diastolic (congestive) heart failure: Secondary | ICD-10-CM | POA: Insufficient documentation

## 2024-07-18 LAB — ECHOCARDIOGRAM COMPLETE
AR max vel: 1.43 cm2
AV Area VTI: 1.3 cm2
AV Area mean vel: 1.5 cm2
AV Mean grad: 3 mmHg
AV Peak grad: 6.9 mmHg
Ao pk vel: 1.31 m/s
Area-P 1/2: 1.7 cm2
Calc EF: 50.5 %
MV VTI: 1.58 cm2
S' Lateral: 5.1 cm
Single Plane A2C EF: 51.8 %
Single Plane A4C EF: 48.6 %
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
## 2024-07-18 NOTE — Progress Notes (Signed)
  Echocardiogram 2D Echocardiogram has been performed.  Norleen ORN Aydrien Froman 07/18/2024, 1:38 PM

## 2024-07-18 NOTE — Patient Instructions (Signed)
 There has been no changes to your medications.  Your physician recommends that you schedule a follow-up appointment in: 4 months ( February 2026) ** PLEASE CALL THE OFFICE IN DECEMBER TO ARRANGE YOUR FOLLOW UP APPOINTMENT.**  If you have any questions or concerns before your next appointment please send us  a message through [place] or call our office at [phone].    TO LEAVE A MESSAGE FOR THE NURSE SELECT OPTION 2, PLEASE LEAVE A MESSAGE INCLUDING: YOUR NAME DATE OF BIRTH CALL BACK NUMBER REASON FOR CALL**this is important as we prioritize the call backs  YOU WILL RECEIVE A CALL BACK THE SAME DAY AS LONG AS YOU CALL BEFORE 4:00 PM  At the [facility], you and your health needs are our priority. As part of our continuing mission to provide you with exceptional heart care, we have created designated Provider Care Teams. These Care Teams include your primary Cardiologist (physician) and Advanced Practice Providers (APPs- Physician Assistants and Nurse Practitioners) who all work together to provide you with the care you need, when you need it.   You may see any of the following providers on your designated Care Team at your next follow up: Dr Toribio Fuel Dr Ezra Shuck Dr. Ria Commander Dr. Morene Brownie Amy Lenetta, NP Caffie Shed, [place] [facility] [place], [place] Beckey Coe, NP [place] Lee, NP Ellouise Class, NP Tinnie Redman, PharmD Jaun Bash, PharmD   Please be sure to bring in all your medications bottles to every appointment.    Thank you for choosing [facility]

## 2024-07-24 ENCOUNTER — Ambulatory Visit (HOSPITAL_COMMUNITY)
Admission: RE | Admit: 2024-07-24 | Discharge: 2024-07-24 | Disposition: A | Discharge status: Home / Self Care | Source: Ambulatory Visit | Attending: Physician Assistant | Admitting: Physician Assistant

## 2024-07-24 VITALS — BP 142/78 | HR 69 | Ht 64.0 in | Wt 126.0 lb

## 2024-07-24 DIAGNOSIS — Z79899 Other long term (current) drug therapy: Secondary | ICD-10-CM | POA: Diagnosis not present

## 2024-07-24 DIAGNOSIS — I480 Paroxysmal atrial fibrillation: Secondary | ICD-10-CM

## 2024-07-24 DIAGNOSIS — Z5181 Encounter for therapeutic drug level monitoring: Secondary | ICD-10-CM

## 2024-07-24 DIAGNOSIS — I4891 Unspecified atrial fibrillation: Secondary | ICD-10-CM | POA: Diagnosis not present

## 2024-07-24 NOTE — Progress Notes (Signed)
 Primary Care Physician: Crecencio Chiquita POUR, FNP Primary Cardiologist: Lonni LITTIE Nanas, MD Electrophysiologist: OLE ONEIDA HOLTS, MD  [facility]: Dr Cherrie  Referring Physician: Dr HOLTS Arland Darra Mal is a 63 y.o. female with a history of hypothyroidism, PVCs, CHF s/p ICD, atrial fibrillation who presents for follow up in the [facility].  The patient has been maintained on amiodarone  for her atrial fibrillation. She was seen by Dr HOLTS and underwent afib ablation on 06/26/24. Patient is on Xarelto  for stroke prevention.    Patient presents today for follow up for atrial fibrillation and amiodarone  monitoring. She remains in SR today and feels well. She denies chest pain or groin issues. No interim symptoms of afib.  Today, she denies symptoms of palpitations, chest pain, shortness of breath, orthopnea, PND, lower extremity edema, dizziness, presyncope, syncope, snoring, daytime somnolence, bleeding, or neurologic sequela. The patient is tolerating medications without difficulties and is otherwise without complaint today.    Atrial Fibrillation Risk Factors:  she does not have symptoms or diagnosis of sleep apnea. she does not have a history of rheumatic fever.   Atrial Fibrillation Management history:  Previous antiarrhythmic drugs: amiodarone   Previous cardioversions: none Previous ablations: 06/26/24 Anticoagulation history: Xarelto   ROS- All systems are reviewed and negative except as per the HPI above.  Past Medical History:  Diagnosis Date   CHF (congestive heart failure) (HCC)    Hyperlipidemia    Hypothyroidism    PVC (premature ventricular contraction)     Current Outpatient Medications  Medication Sig Dispense Refill   alum & mag hydroxide-simeth (MAALOX/MYLANTA) 200-200-20 MG/5ML suspension Take 15-30 mLs by mouth every 6 (six) hours as needed for indigestion or heartburn.     amiodarone  (PACERONE ) 100 MG tablet Take 1 tablet  (100 mg total) by mouth daily. 30 tablet 5   Calcium  & Magnesium Carbonates (MYLANTA PO) Take 1 tablet by mouth daily as needed (indigestion).     calcium  carbonate (TUMS - DOSED IN MG ELEMENTAL CALCIUM ) 500 MG chewable tablet Chew 1-2 tablets by mouth 3 (three) times daily as needed for indigestion or heartburn.     dapagliflozin  propanediol (FARXIGA ) 10 MG TABS tablet Take 10 mg by mouth in the morning.     Lactobacillus (PROBIOTIC ACIDOPHILUS PO) Take 1 tablet by mouth daily in the afternoon.     losartan  (COZAAR ) 25 MG tablet Take 0.5 tablets (12.5 mg total) by mouth daily. 45 tablet 3   metoprolol  succinate (TOPROL -XL) 25 MG 24 hr tablet Take 1 tablet (25 mg total) by mouth daily. 90 tablet 3   Multiple Vitamins-Minerals (MULTIVITAMIN WITH MINERALS) tablet Take 1 tablet by mouth daily in the afternoon.     pantoprazole  (PROTONIX ) 40 MG tablet Take 1 tablet (40 mg total) by mouth daily. 45 tablet 0   potassium chloride  SA (KLOR-CON  M) 20 MEQ tablet Take 1 tablet (20 mEq total) by mouth daily as needed (take with torsemide  as needed). (Patient taking differently: Take 20 mEq by mouth as needed (take with torsemide  as needed).) 30 tablet 3   rivaroxaban  (XARELTO ) 20 MG TABS tablet Take 1 tablet (20 mg total) by mouth daily with supper. 30 tablet 5   simethicone  (MYLICON) 125 MG chewable tablet Chew 125 mg by mouth every 6 (six) hours as needed for flatulence. (Patient taking differently: Chew 125 mg by mouth as needed for flatulence.)     torsemide  (DEMADEX ) 20 MG tablet Take 1 tablet (20 mg total) by mouth  daily as needed. (Patient taking differently: Take 20 mg by mouth as needed.) 90 tablet 3   No current facility-administered medications for this encounter.    Physical Exam: BP (!) 142/78   Pulse 69   Ht 5' 4 (1.626 m)   Wt 57.2 kg   BMI 21.63 kg/m   GEN: Well nourished, well developed in no acute distress CARDIAC: Regular rate and rhythm with occasional ectopy, no murmurs, rubs,  gallops RESPIRATORY:  Clear to auscultation without rales, wheezing or rhonchi  ABDOMEN: Soft, non-tender, non-distended EXTREMITIES:  No edema; No deformity   Wt Readings from Last 3 Encounters:  07/24/24 57.2 kg  07/18/24 57.2 kg  06/26/24 55.8 kg     EKG today demonstrates  SR, PVCs Vent. rate 69 BPM PR interval 150 ms QRS duration 88 ms QT/QTcB 410/439 ms   Echo 07/18/24 demonstrated   1. Apical window is foreshortened, making accurate assessment of LVEF  difficult. Left ventricular ejection fraction, by estimation, is 30 to  35%. The left ventricle has moderately decreased function. The left  ventricle demonstrates global hypokinesis. The left ventricular internal cavity size was severely dilated.   2. Right ventricular systolic function is low normal. The right  ventricular size is normal. A is visualized in the 3. There is normal  pulmonary artery systolic pressure.   3. Left atrial size was moderately dilated.   4. Right atrial size was mildly dilated.   5. The mitral valve is normal in structure. Mild mitral valve  regurgitation.   6. The aortic valve is tricuspid. Aortic valve regurgitation is mild.   7. The inferior vena cava is normal in size with greater than 50%  respiratory variability, suggesting right atrial pressure of 3 mmHg.    CHA2DS2-VASc Score = 2  The patient's score is based upon: CHF History: 1 HTN History: 0 Diabetes History: 0 Stroke History: 0 Vascular Disease History: 0 Age Score: 0 Gender Score: 1       ASSESSMENT AND PLAN: Paroxysmal Atrial Fibrillation (ICD10:  I48.0) The patient's CHA2DS2-VASc score is 2, indicating a 2.2% annual risk of stroke.   S/p afib ablation 06/26/24 Patient appears to be maintaining SR Continue amiodarone  100 mg daily for now. Continue Toprol  25 mg daily Continue Xarelto  20 mg daily with no missed doses for 3 months post ablation.    High Risk Medication Monitoring (ICD 10: [id number]) Patient requires  ongoing monitoring for anti-arrhythmic medication which has the potential to cause life threatening arrhythmias. Intervals on ECG acceptable for amiodarone  monitoring.   Chronic HFrEF EF 30-35%, s/p ICD GDMT per [facility] team Fluid status appears stable today   Follow up with Daphne Barrack as scheduled.    [facility] [address] [place], [place] [address] [phone]

## 2024-07-25 ENCOUNTER — Ambulatory Visit

## 2024-07-25 DIAGNOSIS — I4891 Unspecified atrial fibrillation: Secondary | ICD-10-CM

## 2024-07-26 LAB — CUP PACEART REMOTE DEVICE CHECK
Battery Remaining Longevity: 141 mo
Battery Voltage: 3.04 V
Brady Statistic AP VP Percent: 0.04 %
Brady Statistic AP VS Percent: 59.88 %
Brady Statistic AS VP Percent: 0 %
Brady Statistic AS VS Percent: 40.08 %
Brady Statistic RA Percent Paced: 68.64 %
Brady Statistic RV Percent Paced: 0.04 %
Date Time Interrogation Session: 20251022204826
HighPow Impedance: 65 Ohm
Implantable Lead Connection Status: 753985
Implantable Lead Connection Status: 753985
Implantable Lead Implant Date: 20250313
Implantable Lead Implant Date: 20250313
Implantable Lead Location: 753859
Implantable Lead Location: 753860
Implantable Lead Model: 5076
Implantable Pulse Generator Implant Date: 20250313
Lead Channel Impedance Value: 228 Ohm
Lead Channel Impedance Value: 304 Ohm
Lead Channel Impedance Value: 513 Ohm
Lead Channel Pacing Threshold Amplitude: 0.5 V
Lead Channel Pacing Threshold Amplitude: 1.5 V
Lead Channel Pacing Threshold Pulse Width: 0.4 ms
Lead Channel Pacing Threshold Pulse Width: 0.4 ms
Lead Channel Sensing Intrinsic Amplitude: 2.3 mV
Lead Channel Sensing Intrinsic Amplitude: 9 mV
Lead Channel Setting Pacing Amplitude: 1.5 V
Lead Channel Setting Pacing Amplitude: 2.25 V
Lead Channel Setting Pacing Pulse Width: 0.4 ms
Lead Channel Setting Sensing Sensitivity: 0.3 mV
Zone Setting Status: 755011
Zone Setting Status: 755011
Zone Setting Status: 755011

## 2024-07-29 ENCOUNTER — Ambulatory Visit: Payer: Self-pay | Admitting: Cardiology

## 2024-07-29 NOTE — Progress Notes (Signed)
 Remote ICD Transmission

## 2024-09-08 NOTE — Progress Notes (Unsigned)
  Electrophysiology Office Follow up Visit Note:    Date:  09/10/2024   ID:  Janet Ramirez, DOB September 15, 1961, MRN [id number]  PCP:  Janet Chiquita POUR, FNP  [facility] Cardiologist:  Janet LITTIE Nanas, MD  [facility] Electrophysiologist:  Janet ONEIDA HOLTS, MD    Interval History:     Janet Ramirez is a 63 y.o. female who presents for a follow up visit.   The patient was last seen by [facility] July 24, 2024.  The patient had a catheter ablation June 26, 2024.  She is on Xarelto  and amiodarone .  At the appointment with [facility] she was maintaining sinus rhythm.  She also has a history of chronic systolic heart failure with an EF of 30 to 35%.  She has an ICD in place.  She is doing well.  Janet Ramirez recurrence of arrhythmia.  She is with her husband Janet Ramirez from previous visits.      Past medical, surgical, social and family history were reviewed.  ROS:   Please see the history of present illness.    All other systems reviewed and are negative.  EKGs/Labs/Other Studies Reviewed:    The following studies were reviewed today:  September 10, 2024 in-clinic device interrogation personally reviewed Battery and lead parameter stable Janet Ramirez atrial fibrillation        Physical Exam:    VS:  BP 118/74   Pulse 66   Ht 5' 4 (1.626 m)   Wt 128 lb 12.8 oz (58.4 kg)   SpO2 98%   BMI 22.11 kg/m     Wt Readings from Last 3 Encounters:  09/10/24 128 lb 12.8 oz (58.4 kg)  07/24/24 126 lb (57.2 kg)  07/18/24 126 lb (57.2 kg)     GEN: Janet Ramirez distress CARD: RRR, Janet Ramirez MRG.  Generator pocket well-healed RESP: Janet Ramirez IWOB. CTAB.      ASSESSMENT:    1. Atrial fibrillation, unspecified type (HCC)   2. Chronic systolic heart failure (HCC)   3. Encounter for long-term (current) use of high-risk medication    PLAN:    In order of problems listed above:  #Atrial fibrillation #High risk medication monitoring-amiodarone  Maintaining sinus rhythm Continue amiodarone  100 mg by mouth once  daily If the patient maintains sinus rhythm for the next 6 months, favor stopping amiodarone  at that time Continue Xarelto  for stroke prophylaxis Update CMP, TSH and free T4 today  #Chronic systolic heart failure #ICD in situ NYHA class II.  EF 30 to 35%. ICD functioning appropriately.  Continue remote monitoring  I discussed my upcoming departure from Janet Ramirez during today's clinic appointment.  The patient will continue to follow-up with one of my EP partners moving forward.  Follow-up 6 months with EP APP  Signed, Janet Holts, MD, [facility], [facility] 09/10/2024 8:51 AM    Electrophysiology [facility]

## 2024-09-10 ENCOUNTER — Encounter: Payer: Self-pay | Admitting: Cardiology

## 2024-09-10 ENCOUNTER — Ambulatory Visit: Payer: Self-pay | Admitting: Cardiology

## 2024-09-10 ENCOUNTER — Ambulatory Visit: Attending: Cardiology | Admitting: Cardiology

## 2024-09-10 VITALS — BP 118/74 | HR 66 | Ht 64.0 in | Wt 128.8 lb

## 2024-09-10 DIAGNOSIS — Z79899 Other long term (current) drug therapy: Secondary | ICD-10-CM

## 2024-09-10 DIAGNOSIS — I4891 Unspecified atrial fibrillation: Secondary | ICD-10-CM

## 2024-09-10 DIAGNOSIS — I5022 Chronic systolic (congestive) heart failure: Secondary | ICD-10-CM

## 2024-09-10 LAB — CUP PACEART INCLINIC DEVICE CHECK
Date Time Interrogation Session: 20251209092509
Implantable Lead Connection Status: 753985
Implantable Lead Connection Status: 753985
Implantable Lead Implant Date: 20250313
Implantable Lead Implant Date: 20250313
Implantable Lead Location: 753859
Implantable Lead Location: 753860
Implantable Lead Model: 5076
Implantable Pulse Generator Implant Date: 20250313

## 2024-09-10 NOTE — Addendum Note (Signed)
 Addended by: GRETEL MAEOLA CROME on: 09/10/2024 09:12 AM   Modules accepted: Orders

## 2024-09-11 LAB — COMPREHENSIVE METABOLIC PANEL WITH GFR
ALT: 18 IU/L (ref 0–32)
AST: 25 IU/L (ref 0–40)
Albumin: 4.5 g/dL (ref 3.9–4.9)
Alkaline Phosphatase: 69 IU/L (ref 49–135)
BUN/Creatinine Ratio: 12 (ref 12–28)
BUN: 13 mg/dL (ref 8–27)
Bilirubin Total: 0.3 mg/dL (ref 0.0–1.2)
CO2: 21 mmol/L (ref 20–29)
Calcium: 9.6 mg/dL (ref 8.7–10.3)
Chloride: 106 mmol/L (ref 96–106)
Creatinine, Ser: 1.12 mg/dL — ABNORMAL HIGH (ref 0.57–1.00)
Globulin, Total: 2 g/dL (ref 1.5–4.5)
Glucose: 87 mg/dL (ref 70–99)
Potassium: 4.5 mmol/L (ref 3.5–5.2)
Sodium: 137 mmol/L (ref 134–144)
Total Protein: 6.5 g/dL (ref 6.0–8.5)
eGFR: 55 mL/min/1.73 — ABNORMAL LOW (ref >59)

## 2024-09-11 LAB — T4, FREE: Free T4: 1.18 ng/dL (ref 0.82–1.77)

## 2024-09-11 LAB — TSH: TSH: 5.58 u[IU]/mL — AB (ref 0.450–4.500)

## 2024-09-23 ENCOUNTER — Other Ambulatory Visit (HOSPITAL_COMMUNITY): Payer: Self-pay | Admitting: Internal Medicine

## 2024-09-24 ENCOUNTER — Encounter: Payer: Self-pay | Admitting: Pharmacist

## 2024-09-24 DIAGNOSIS — K30 Functional dyspepsia: Secondary | ICD-10-CM | POA: Insufficient documentation

## 2024-09-24 DIAGNOSIS — N3945 Continuous leakage: Secondary | ICD-10-CM | POA: Insufficient documentation

## 2024-09-24 DIAGNOSIS — I501 Left ventricular failure: Secondary | ICD-10-CM | POA: Insufficient documentation

## 2024-09-24 DIAGNOSIS — Z8639 Personal history of other endocrine, nutritional and metabolic disease: Secondary | ICD-10-CM | POA: Insufficient documentation

## 2024-09-24 DIAGNOSIS — I214 Non-ST elevation (NSTEMI) myocardial infarction: Secondary | ICD-10-CM | POA: Insufficient documentation

## 2024-09-24 DIAGNOSIS — N3946 Mixed incontinence: Secondary | ICD-10-CM | POA: Insufficient documentation

## 2024-09-24 DIAGNOSIS — R5382 Chronic fatigue, unspecified: Secondary | ICD-10-CM | POA: Insufficient documentation

## 2024-09-27 ENCOUNTER — Encounter: Admitting: Pulmonary Disease

## 2024-10-04 ENCOUNTER — Other Ambulatory Visit: Payer: Self-pay | Admitting: Cardiology

## 2024-10-04 DIAGNOSIS — I480 Paroxysmal atrial fibrillation: Secondary | ICD-10-CM

## 2024-10-04 NOTE — Telephone Encounter (Addendum)
 Xarelto  20mg  refill request received. Pt is 65 years old, weight-58.4kg, Crea-1.12 on 09/10/24, last seen by Dr. Cindie on 09/10/24, Diagnosis-Afib, CrCl-47.71ml/min. Dose is inappropriate based on dosing criteria.   Will have PharmD to evaluate doseage

## 2024-10-07 ENCOUNTER — Encounter: Payer: Self-pay | Admitting: Cardiology

## 2024-10-07 MED ORDER — RIVAROXABAN 15 MG PO TABS: 15.0000 mg | ORAL_TABLET | Freq: Every day | ORAL | 10 refills | Status: AC

## 2024-10-07 NOTE — Telephone Encounter (Addendum)
 Spoke with patient and advised that her xarelto  dose will be reduced due to dosing parameters/creatinine clearance. Explained to her her how this works and total reasoning behind decreasing dose. She verbalized understanding. She states she has a week of them left and will finish the 20mg  out and them pick up the 15mg  and take it daily. She was thankful for the call.

## 2024-10-24 ENCOUNTER — Ambulatory Visit

## 2024-10-24 DIAGNOSIS — I4891 Unspecified atrial fibrillation: Secondary | ICD-10-CM | POA: Diagnosis not present

## 2024-10-24 LAB — CUP PACEART REMOTE DEVICE CHECK
Battery Remaining Longevity: 140 mo
Battery Voltage: 3.02 V
Brady Statistic AP VP Percent: 0.04 %
Brady Statistic AP VS Percent: 62.74 %
Brady Statistic AS VP Percent: 0 %
Brady Statistic AS VS Percent: 37.22 %
Brady Statistic RA Percent Paced: 67.87 %
Brady Statistic RV Percent Paced: 0.04 %
Date Time Interrogation Session: 20260121223431
HighPow Impedance: 71 Ohm
Implantable Lead Connection Status: 753985
Implantable Lead Connection Status: 753985
Implantable Lead Implant Date: 20250313
Implantable Lead Implant Date: 20250313
Implantable Lead Location: 753859
Implantable Lead Location: 753860
Implantable Lead Model: 5076
Implantable Pulse Generator Implant Date: 20250313
Lead Channel Impedance Value: 228 Ohm
Lead Channel Impedance Value: 304 Ohm
Lead Channel Impedance Value: 722 Ohm
Lead Channel Pacing Threshold Amplitude: 0.625 V
Lead Channel Pacing Threshold Amplitude: 1.5 V
Lead Channel Pacing Threshold Pulse Width: 0.4 ms
Lead Channel Pacing Threshold Pulse Width: 0.4 ms
Lead Channel Sensing Intrinsic Amplitude: 2 mV
Lead Channel Sensing Intrinsic Amplitude: 9.6 mV
Lead Channel Setting Pacing Amplitude: 1.5 V
Lead Channel Setting Pacing Amplitude: 2.25 V
Lead Channel Setting Pacing Pulse Width: 0.4 ms
Lead Channel Setting Sensing Sensitivity: 0.3 mV
Zone Setting Status: 755011
Zone Setting Status: 755011
Zone Setting Status: 755011

## 2024-10-26 NOTE — Progress Notes (Signed)
 Remote ICD Transmission

## 2024-10-29 ENCOUNTER — Ambulatory Visit: Payer: Self-pay | Admitting: Cardiovascular Disease

## 2024-11-21 ENCOUNTER — Ambulatory Visit (HOSPITAL_COMMUNITY): Admitting: Internal Medicine

## 2025-01-23 ENCOUNTER — Encounter

## 2025-04-24 ENCOUNTER — Encounter

## 2025-07-24 ENCOUNTER — Encounter

## 2025-10-23 ENCOUNTER — Encounter
